# Patient Record
Sex: Female | Born: 1970 | Race: White | Hispanic: No | Marital: Married | Smoking: Never smoker
Health system: Southern US, Community
[De-identification: ages and names within clinical notes are randomized; demographics above are authoritative.]

## PROBLEM LIST (undated history)

## (undated) DIAGNOSIS — E039 Hypothyroidism, unspecified: Secondary | ICD-10-CM

## (undated) DIAGNOSIS — F32A Depression, unspecified: Secondary | ICD-10-CM

## (undated) DIAGNOSIS — Z803 Family history of malignant neoplasm of breast: Secondary | ICD-10-CM

## (undated) DIAGNOSIS — C801 Malignant (primary) neoplasm, unspecified: Secondary | ICD-10-CM

## (undated) DIAGNOSIS — G43909 Migraine, unspecified, not intractable, without status migrainosus: Secondary | ICD-10-CM

## (undated) DIAGNOSIS — Z923 Personal history of irradiation: Secondary | ICD-10-CM

## (undated) DIAGNOSIS — L501 Idiopathic urticaria: Secondary | ICD-10-CM

## (undated) DIAGNOSIS — Z8249 Family history of ischemic heart disease and other diseases of the circulatory system: Secondary | ICD-10-CM

## (undated) DIAGNOSIS — Z8042 Family history of malignant neoplasm of prostate: Secondary | ICD-10-CM

## (undated) DIAGNOSIS — F419 Anxiety disorder, unspecified: Secondary | ICD-10-CM

## (undated) DIAGNOSIS — E663 Overweight: Secondary | ICD-10-CM

## (undated) DIAGNOSIS — C50412 Malignant neoplasm of upper-outer quadrant of left female breast: Secondary | ICD-10-CM

## (undated) DIAGNOSIS — E079 Disorder of thyroid, unspecified: Secondary | ICD-10-CM

## (undated) HISTORY — PX: OTHER SURGICAL HISTORY: SHX169

## (undated) HISTORY — DX: Malignant neoplasm of upper-outer quadrant of left female breast: C50.412

## (undated) HISTORY — DX: Idiopathic urticaria: L50.1

## (undated) HISTORY — DX: Family history of malignant neoplasm of breast: Z80.3

## (undated) HISTORY — PX: WISDOM TOOTH EXTRACTION: SHX21

## (undated) HISTORY — DX: Overweight: E66.3

## (undated) HISTORY — DX: Family history of malignant neoplasm of prostate: Z80.42

## (undated) HISTORY — DX: Personal history of irradiation: Z92.3

## (undated) HISTORY — DX: Disorder of thyroid, unspecified: E07.9

## (undated) HISTORY — PX: MULTIPLE TOOTH EXTRACTIONS: SHX2053

## (undated) HISTORY — DX: Migraine, unspecified, not intractable, without status migrainosus: G43.909

## (undated) HISTORY — PX: DILATION AND CURETTAGE OF UTERUS: SHX78

---

## 1998-05-20 ENCOUNTER — Encounter: Admission: RE | Admit: 1998-05-20 | Discharge: 1998-05-20 | Payer: Self-pay | Admitting: *Deleted

## 2001-07-17 ENCOUNTER — Encounter: Admission: RE | Admit: 2001-07-17 | Discharge: 2001-07-17 | Payer: Self-pay | Admitting: Family Medicine

## 2001-07-17 ENCOUNTER — Encounter: Payer: Self-pay | Admitting: Family Medicine

## 2002-08-24 ENCOUNTER — Emergency Department (HOSPITAL_COMMUNITY): Admission: EM | Admit: 2002-08-24 | Discharge: 2002-08-24 | Payer: Self-pay | Admitting: Emergency Medicine

## 2002-08-24 ENCOUNTER — Encounter: Payer: Self-pay | Admitting: Emergency Medicine

## 2002-09-01 ENCOUNTER — Ambulatory Visit (HOSPITAL_COMMUNITY): Admission: RE | Admit: 2002-09-01 | Discharge: 2002-09-01 | Payer: Self-pay | Admitting: Obstetrics and Gynecology

## 2002-09-01 ENCOUNTER — Encounter: Payer: Self-pay | Admitting: Obstetrics and Gynecology

## 2002-11-26 ENCOUNTER — Other Ambulatory Visit: Admission: RE | Admit: 2002-11-26 | Discharge: 2002-11-26 | Payer: Self-pay | Admitting: Obstetrics and Gynecology

## 2003-05-02 ENCOUNTER — Inpatient Hospital Stay (HOSPITAL_COMMUNITY): Admission: AD | Admit: 2003-05-02 | Discharge: 2003-05-05 | Payer: Self-pay | Admitting: *Deleted

## 2004-06-23 ENCOUNTER — Other Ambulatory Visit: Admission: RE | Admit: 2004-06-23 | Discharge: 2004-06-23 | Payer: Self-pay | Admitting: Obstetrics and Gynecology

## 2005-01-03 ENCOUNTER — Ambulatory Visit: Admission: RE | Admit: 2005-01-03 | Discharge: 2005-01-03 | Payer: Self-pay | Admitting: Obstetrics and Gynecology

## 2005-01-05 ENCOUNTER — Ambulatory Visit (HOSPITAL_COMMUNITY): Admission: RE | Admit: 2005-01-05 | Discharge: 2005-01-05 | Payer: Self-pay | Admitting: Obstetrics and Gynecology

## 2005-01-06 ENCOUNTER — Encounter (INDEPENDENT_AMBULATORY_CARE_PROVIDER_SITE_OTHER): Payer: Self-pay | Admitting: *Deleted

## 2005-01-06 ENCOUNTER — Ambulatory Visit (HOSPITAL_COMMUNITY): Admission: RE | Admit: 2005-01-06 | Discharge: 2005-01-06 | Payer: Self-pay | Admitting: Obstetrics and Gynecology

## 2005-08-23 ENCOUNTER — Other Ambulatory Visit: Admission: RE | Admit: 2005-08-23 | Discharge: 2005-08-23 | Payer: Self-pay | Admitting: Obstetrics and Gynecology

## 2005-08-23 ENCOUNTER — Other Ambulatory Visit: Admission: RE | Admit: 2005-08-23 | Discharge: 2005-08-23 | Payer: Self-pay | Admitting: *Deleted

## 2005-10-11 ENCOUNTER — Ambulatory Visit (HOSPITAL_COMMUNITY): Admission: RE | Admit: 2005-10-11 | Discharge: 2005-10-11 | Payer: Self-pay | Admitting: Obstetrics and Gynecology

## 2006-03-18 ENCOUNTER — Inpatient Hospital Stay (HOSPITAL_COMMUNITY): Admission: RE | Admit: 2006-03-18 | Discharge: 2006-03-20 | Payer: Self-pay | Admitting: Obstetrics and Gynecology

## 2007-12-16 ENCOUNTER — Encounter: Admission: RE | Admit: 2007-12-16 | Discharge: 2007-12-16 | Payer: Self-pay | Admitting: Family Medicine

## 2008-03-30 ENCOUNTER — Encounter: Admission: RE | Admit: 2008-03-30 | Discharge: 2008-03-30 | Payer: Self-pay | Admitting: Allergy and Immunology

## 2009-11-03 ENCOUNTER — Other Ambulatory Visit: Admission: RE | Admit: 2009-11-03 | Discharge: 2009-11-03 | Payer: Self-pay | Admitting: Family Medicine

## 2011-02-24 NOTE — H&P (Signed)
NAME:  Marie Castro, Marie Castro                            ACCOUNT NO.:  [id number]   MEDICAL RECORD NO.:  [id number]                   PATIENT TYPE:  INP   LOCATION:  9160                                 [facility]   PHYSICIAN:  Osborn Coho, M.D.                DATE OF BIRTH:  Dec 23, 1970   DATE OF ADMISSION:  05/02/2003  DATE OF DISCHARGE:                                HISTORY & PHYSICAL   HISTORY OF PRESENT ILLNESS:  Marie Castro is a 40 year old, G1, P0, at 42 and  5/7ths weeks.  Estimated date of delivery 04/28/03 by dates, confirmed with  18-week ultrasound.  She was admitted overnight for therapeutic rest.  She  had been contracting for the past two days and was quite uncomfortable.  Therapeutic rest was successful, and the patient was able to rest through  the night.  This morning her contractions have become stronger and closer  together.  She is now 4 cm dilated and is admitted for early labor.  She  reports positive fetal movement.  No bleeding, no rupture of membranes.  She  has no PIH symptoms.  Her pregnancy has been followed by the CNM service at  [facility] and is remarkable for (1) hypothyroidism, (2) vegetarian, (3) group B  strep negative.  This patient's pregnancy was initially evaluated at the  office of [facility] on 09/30/02 at approximately [redacted] weeks gestation.  EDC  determined by dates and confirmed with ultrasound.  Her pregnancy has been  essentially unremarkable.  She has been size equal to dates throughout.  Normotensive with no proteinuria.   PRENATAL LABORATORY WORK:  On 10/01/03, hemoglobin and hematocrit were 13.1  and 38.9, platelets 329,000.  Blood type and Rh O+, antibody screen  negative, VDRL nonreactive, rubella immune.  Hepatitis B surface antigen  negative.  HIV nonreactive.  Pap within normal limits.  GC and Chlamydia  negative.  PSA in the first trimester 3.646; in the second trimester 2.978,  and was also within normal limits in the third trimester.  Quad screen  was  declined at 28 weeks.  One hour glucose challenge within normal limits.  Hemoglobin 12.3.  At 36 weeks, culture of the vaginal tract is negative for  group B strep, and also negative for GC and Chlamydia.   MEDICATIONS:  1. Levoxyl daily.  2. Prenatal vitamins.   ALLERGIES:  No known drug allergies.   ACTIVITY:  Denies the use of tobacco, alcohol, or illicit drugs.   OB HISTORY:  Present pregnancy.   MEDICAL HISTORY:  Hypothyroidism.   SURGICAL HISTORY:  Tonsils and adenoids.   FAMILY HISTORY:  The patient's father and paternal grandfather with MI.  The  patient's mother with cardiovascular disease.  Maternal grandmother with  congestive heart failure.  Maternal grandmother and maternal aunt with  breast cancer.  Maternal grandfather and paternal grandfather with prostate  cancer.  A maternal  first cousin has a history of epilepsy.   GENETIC HISTORY:  There is no family history of familial or chromosomal  disorders, children who died at infancy, or who were both with birth  defects.   SOCIAL HISTORY:  Marie Castro is a married 40 year old Caucasian female.  Her  husband, Chip Segall, is involved and supportive.  They are [place] in their  faith.   REVIEW OF SYSTEMS:  As described above.  The patient is typical of one with  the uterine pregnancy at term in early labor.   PHYSICAL EXAMINATION:  VITAL SIGNS:  Stable, afebrile.  HEENT:  Unremarkable.  HEART:  Regular rate and rhythm.  LUNGS:  Clear.  ABDOMEN:  Gravid in its contour.  Uterine fundus is noted to extend 39 cm  above the level of pubic symphysis.  Leopold maneuver __________.  Cephalic  presentation.  The estimated fetal weight is 8 pounds.  The baseline of the  fetal heart rate monitor is 140's.  Positive variability, positive  accelerations.  Reactive with no pruritic changes.  The patient is  contracting every 3-5 minutes.  Digital exam of the cervix finds it to be 4  cm dilated, 90% effaced, with the  cephalic presenting part at a -1 station  and membranes intact.  EXTREMITIES:  No pathologic edema.  DTRs are 1+ with no clonus.   ASSESSMENT:  Intrauterine pregnancy at term, early labor.   PLAN:  Admit per Dr. Molly Maduro.  Routine CNM orders.  Follow expectantly at the  present time.     Rica Koyanagi, C.N.M.               Osborn Coho, M.D.    SDM/MEDQ  D:  05/03/2003  T:  05/03/2003  Job:  [id number]

## 2011-02-24 NOTE — Consult Note (Signed)
NAMEJALEYAH, Castro NO.:  [id number]   MEDICAL RECORD NO.:  [id number]          PATIENT TYPE:  OUT   LOCATION:  DFTL                          [facility]   PHYSICIAN:  Osborn Coho, M.D.   DATE OF BIRTH:  1971/08/09   DATE OF CONSULTATION:  01/03/2005  DATE OF DISCHARGE:  01/03/2005                                   CONSULTATION   Date of Consultation 01/03/05   A consultation was done on [name] in the ultrasound suite after being  called with notification of an ultrasound report.  The patient had several  questions and also called her husband who had further questions.  Approximately one hour was spent in discussion with [name] and her  husband regarding options, which were observation, medical management,  surgical management or a combination of these.  I discussed the pregnancy  was likely abnormal and that a D&C could be performed at that point in time  in order to better assess the pregnancy and then to offer methotrexate if no  villi were noted in the uterine cavity.  The patient and her husband decided  to proceed with observation for at least one more b-hcg on March 30.  During  this discussion, I also went to review the ultrasound films with the  radiologist.  The patient planned to follow up as scheduled.  The date of  the consultation was January 03, 2005.      AR/MEDQ  D:  02/06/2005  T:  02/06/2005  Job:  [id number]

## 2011-02-24 NOTE — H&P (Signed)
NAMEZAEDA, MCFERRAN NO.:  [id number]   MEDICAL RECORD NO.:  [id number]          PATIENT TYPE:  AMB   LOCATION:  SDC                           [facility]   PHYSICIAN:  Janine Limbo, M.D.DATE OF BIRTH:  [birth year]   DATE OF ADMISSION:  DATE OF DISCHARGE:                                HISTORY & PHYSICAL   HISTORY OF PRESENT ILLNESS:  [name] is a 40 year old married white female  with a history of hypothyroidism, gravida 2, para 1-0-0-1 who presents for a  D&E because of an incomplete AB.  The patient was seen at [facility] on December 22, 2004, with first trimester spotting of a few hours  duration (the patient's last menstrual period had been November 10, 2004,  making her approximately six weeks pregnant by dates).  The patient's  quantitative HCG on December 22, 2004, was 1083.0 and her ABORH was O positive.  Patient only reported feeling achy in her abdominal area at the time of her  first visit but denied any frank pain. nausea, vomiting, fever or vaginitis  symptoms.  The next day, patient was seen at [facility] because  of increased vaginal bleeding requiring the change of a perineal pad every  two to three hours accompanied by cramping in her lower abdomen and back  area which she rated as a 7/10 on a 10-point scale.  Patient reported that  this all seemed very much like  her regular menstrual period.  A stat  quantitative HCG on that day revealed a value of 700.4.  Patient was then  counseled regarding a probable incomplete AB and given the option of  expectant management or a D&E.  Patient chose the former.  Patient was again  seen on December 30, 2004, with continued bleeding requiring the change of five  perineal pads per day but denied any further cramping.  A quantitative HCG  done that day was surprisingly elevated at 1169.0.  A follow-up quantitative  HCG was also done on January 03, 2005, which again was elevated at  1216.5.  A  transvaginal ultrasound done on January 03, 2005, showed minimal hyperechoic  material with Doppler flow measuring 8 mm x 11 mm raising the question of  gestational tropoblastic disease.  There was no identifiable intrauterine  gestational sac, no evidence of adnexal ectopic pregnancy and no free fluid.  Ovaries both appeared normal on this study with the observation of a left  corpus luteum cyst measuring 2.2 cm.  Given this report, a final  quantitative HCG was performed on January 05, 2005, yielding the value of  822.4. This drop in quantitative HCG level further supported a diagnosis of  incomplete AB.  Patient was again counseled regarding the findings and  management options and she has consented for a D&E.   CURRENT MEDICATIONS:  Levoxyl and prenatal vitamins.   ALLERGIES:  No known drug allergies.   PAST MEDICAL HISTORY:  1.  OB history:  Gravida 2, para 1-0-0-1.  2.  Medical history is positive for hypothyroidism.   PAST  SURGICAL HISTORY:  1.  Patient had ear tubes placed as an infant.  2.  She has had a tonsillectomy.   FAMILY HISTORY:  Positive for thyroid disease, heart disease, breast cancer  (maternal grandmother and maternal aunt), prostate cancer and bladder  cancer, Crohn's disease and osteoporosis.   HABITS:  Patient does not use alcohol or tobacco.   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   REVIEW OF SYMPTOMS:  Negative except as mentioned in the history of present  illness.   PHYSICAL EXAMINATION:  VITAL SIGNS:  (December 30, 2004)  Blood pressure  120/68, weight 145, height 5 feet 6 inches tall.  LUNGS:  Clear to auscultation.  There are no wheezing, rhonchi or rales.  CARDIOVASCULAR:  Regular rate and rhythm.  There is no murmur.  BACK:  No CVA tenderness.  ABDOMEN:  Bowel sounds are present.  It is soft without tenderness,  guarding, rebound or organomegaly.   PELVIC:  EGBUS is within normal limits.  Vagina is rugous.  There is a small  amount of brown discharge in the vaginal vault.  Cervix is nontender without  lesions.  It is long and closed.  Uterus is normal size, shape and  consistency without tenderness.  Adnexa without tenderness or masses.  Rectovaginal without tenderness or masses.   IMPRESSION:  1.  Incomplete spontaneous abortion.  2.  Hypothyroidism.   DISPOSITION:  A discussion was held with the patient regarding the  indications for her procedure along with the risk involved which include,  but are not limited to, reaction to anesthesia, damage to adjacent organs,  excessive bleeding, infection, and the possibility of the formation of scar  tissue within the endometrium.  Patient has accepted these risks and has  consented to proceed with a dilatation and evacuation at [facility] of  [place] on January 06, 2005.      EJP/MEDQ  D:  01/05/2005  T:  01/05/2005  Job:  [id number]

## 2011-02-24 NOTE — H&P (Signed)
NAME:  Marie Castro, Marie Castro                            ACCOUNT NO.:  [id number]   MEDICAL RECORD NO.:  [id number]                   PATIENT TYPE:  INP   LOCATION:  9160                                 [facility]   PHYSICIAN:  Osborn Coho, M.D.                DATE OF BIRTH:  16-Jul-1971   DATE OF ADMISSION:  05/02/2003  DATE OF DISCHARGE:                                HISTORY & PHYSICAL   HISTORY OF PRESENT ILLNESS:  [name] is a 40 year old married white female,  gravida 1, para 0, at 40-4/7 weeks by ultrasound, who presents complaining  of uterine contractions q.3 min. that have increased in intensity and  frequency throughout the day and throughout most of the previous night.  She  denies any leaking or bleeding but did pass her mucous plug and had some  bloody show last evening.  She reports positive fetal movement.  She denies  nausea, vomiting, headaches, or visual disturbances.  She is very  uncomfortable with her uterine contractions and initially was rating them a  9/10 and was requesting pain medication upon her arrival.   Her cervix was initially 1.5 cm, 90%, vertex, and -1, but she was too  uncomfortable to ambulate, and as previously mentioned, requested some  medication for pain.  She did receive 2 mg of Stadol IM and 12.5 mg of  Phenergan IM and rested in between her uterine contractions but was still  very uncomfortable with the uterine contractions.  Her contractions did  space out to about q.5-7 min., but she is still too uncomfortable to venture  home.   Her pregnancy has been followed at [facility] by the certified  nurse midwife and has been at risk for a  history of hypothyroidism on  Synthroid.  Her group B Streptococcus, GC, and Chlamydia were all negative  at 36 weeks.   OB/GYN HISTORY:  She is a primigravida with LMP July 22, 2002, giving her  an [facility] of April 28, 2003.  Her OB/GYN is noncontributory.   PAST MEDICAL HISTORY:  She reports  having had the usual childhood diseases.  She has no other medical problems other than hypothyroidism, for which she  takes Synthroid.   PAST SURGICAL HISTORY:  Her only surgery was tonsils and adenoids as a  child.   ALLERGIES:  She has no known drug allergies.   FAMILY HISTORY:  Father and paternal grandfather with MI, mother with  coronary artery disease, maternal grandmother with congestive heart failure.  She has multiple family members and grandparents with breast cancer and  prostate cancer.   GENETIC HISTORY:  Negative.   SOCIAL HISTORY:  She is married to [employer], who is involved and  supportive.  They are both employed full time.  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  They deny any illicit  drug  use, alcohol, or smoking with this pregnancy.  They are of the  [place] faith.   LABORATORY DATA:  Prenatal laboratories:  Blood type O positive.  Antibody  screen negative.  Syphilis nonreactive.  Rubella positive.  Hepatitis B  surface antigen negative.  HIV nonreactive.  GC, Chlamydia, and group B  Streptococcus all negative at 36 weeks' gestation.  TSHs have been stable.  One hour Glucola 68.   PHYSICAL EXAMINATION:  VITAL SIGNS:  Stable.  She is afebrile.  HEENT:  Grossly within normal limits.  HEART:  Regular rate and rhythm.  CHEST:  Clear.  BREASTS:  Soft and nontender.  ABDOMEN:  Gravid with uterine contractions currently q.3-7 min.  Her fetal  heart rate is reactive and reassuring.  Occasional early decelerations have  been noted.  PELVIC:  Cervix 1.5-2 cm, 90%, vertex, and -1.  EXTREMITIES:  Within normal limits.   ASSESSMENT:  1. Intrauterine pregnancy at 40-4/7 weeks.  2. Prolonged prodromal labor.  3. Maternal fatigue.  4. Negative group B Streptococcus.  5. Hypothyroidism.   PLAN:  Per consultation with Dr. Su Hilt, is to try some increased  therapeutic rest and to reevaluate her labor pattern after that.   The  patient and her husband agree with the plan.     Concha Pyo. Duplantis, C.N.M.              Osborn Coho, M.D.    SJD/MEDQ  D:  05/02/2003  T:  05/02/2003  Job:  [id number]

## 2011-02-24 NOTE — Op Note (Signed)
NAMECHARLA, CRISCIONE                  ACCOUNT NO.:  [id number]   MEDICAL RECORD NO.:  [id number]          PATIENT TYPE:  AMB   LOCATION:  SDC                           [facility]   PHYSICIAN:  Janine Limbo, M.D.DATE OF BIRTH:  [birth year]   DATE OF PROCEDURE:  01/06/2005  DATE OF DISCHARGE:                                 OPERATIVE REPORT   PREOPERATIVE DIAGNOSIS:  First trimester missed abortion.   POSTOPERATIVE DIAGNOSIS:  First trimester missed abortion.   PROCEDURE:  Suction dilatation and evacuation.   SURGEON:  Janine Limbo, M.D.   ANESTHETIC:  Monitored anesthetic control, paracervical block using 0.5%  Marcaine with epinephrine.   DISPOSITION:  Ms. Zia is a 40 year old female, gravida 2, para 1-0-0-1, who  presents in the first trimester.  The patient has had abnormal quantitative  HCG values.  Her chorionic gonadotropin has been checked on multiple  occasions.  An ultrasound was performed and no identifiable intrauterine  gestation could be seen.  The patient understands the indications for her  surgical procedure and she accepts the risk of, but not limited to,  anesthetic complications, bleeding, infection, and possible damage to  surrounding organs.   FINDINGS:  The patient is blood type is O+.  Her preoperative hemoglobin was  12.8.  A small amount of tissue was removed from within the uterus.   PROCEDURE:  The patient was taken to the operating room, where she was given  medication through her IV line.  The patient's perineum and vagina were  prepped with multiple layers of Betadine.  The bladder was drained of urine.  Examination under anesthesia was performed.  The patient was then sterilely  draped.  A paracervical block was placed using 10 mL of 0.5% Marcaine with  epinephrine.  A single-tooth tenaculum was placed on the cervix.  The uterus  was sounded to 8 cm.  The cervix was gradually dilated.  A #10 suction  curette was used to evacuate the  uterus.  The uterus was then explored using  Randall stone forceps and then cleaned with a medium sharp curette.  The  cavity was felt to be clean at the end of our procedure.  The patient  tolerated the procedure well.  Hemostasis was adequate.  The patient was  returned to a supine position.  She was awakened from her anesthetic.  She  was taken to the recovery room in stable condition.  Sponge, needle, and  instrument counts were correct on two occasions. The estimated blood loss  was 10 mL.  The patient tolerated her procedure well.  She was taken to the  recovery room in stable condition.   FOLLOW-UP INSTRUCTIONS:  The patient will take ibuprofen 600 mg every six  hours as needed as needed for mild to moderate pain.  She will take Vicodin one or two tablets every four hours as needed for  severe pain.  She will call for questions or concerns.  She was given a copy  of the postoperative instruction sheet as prepared by the [facility]  of [place] for  patients who have undergone a dilatation and curettage.      AVS/MEDQ  D:  01/06/2005  T:  01/06/2005  Job:  [id number]

## 2011-02-24 NOTE — H&P (Signed)
NAMEARONDA, Castro                  ACCOUNT NO.:  [id number]   MEDICAL RECORD NO.:  [id number]          PATIENT TYPE:  INP   LOCATION:  9163                          [facility]   PHYSICIAN:  Hal Morales, M.D.DATE OF BIRTH:  1971-06-12   DATE OF ADMISSION:  03/18/2006  DATE OF DISCHARGE:                                HISTORY & PHYSICAL   HISTORY OF PRESENT ILLNESS:  [name] is a 40 year old gravida 3, para 1-0-1-  1 who presents at 15 and 3/7 weeks, EDD March 15, 2006 as determined by dates  and confirmed with early first trimester ultrasound.  The patient presents  in active labor with contractions increasing in frequency and intensity.  She reports positive fetal movement.  No vaginal bleeding.  No rupture of  membranes.  Denies any headache, visual changes or epigastric pain.   Her pregnancy has been followed by the C.N.M. service at [facility] and is  remarkable for:  1.  Advanced maternal age.  2.  Hypothyroid.  3.  Postpartum depression.  4.  Group B strep negative.   This patient entered prenatal care at [facility] on July 25, 2006 at  approximately [redacted] weeks gestation.  EDC determined by dates confirmed with  ultrasound.  The patient has had an uneventful pregnancy, except for back  pain, which did become progressively worse throughout her pregnancy,  requiring the patient to leave work for maternity leave at approximately 36  weeks' gestation.  She was seen at [facility] for physical therapy and has felt  better since being home and not having the stress of work.  The patient has  been size equal to dates throughout.  She has been normotensive with no  proteinuria.  Her initial pregnancy weight was 145; last recorded pregnancy  weight 182, for a weight gain of 37 pounds.  BMI by a pre-pregnant weight is  23.   OBSTETRICAL HISTORY:  In 2004, the patient had a normal spontaneous vaginal  delivery at term with birth of a 7 pound 1 ounce female infant named Marie Castro  with no  complications.  Her second pregnancy in 2006 was a first trimester  SAB with a D&C; no complications.  This is her third and current pregnancy.   PRENATAL LABORATORY WORK:  On August 23, 2005, the patient's hemoglobin  and hematocrit were 13.0 and 37.1, platelets 286,000, VDRL nonreactive,  rubella immune, hepatitis B surface antigen negative, blood type O positive,  antibody screen.  Quad screen declined at 28 week.  One hour glucose  challenge 109.  Hemoglobin 11.4 at that time.  At 36 weeks, culture of the  vaginal tract is negative for group B strep.   MEDICAL HISTORY:  1.  Hypothyroid.  2.  Postpartum depression.   SURGICAL HISTORY:  1.  D&C in 2006.  2.  T&A as a child.   FAMILY HISTORY:  The patient's mother, father, maternal grandmother and  paternal grandfather with heart disease.  Maternal grandmother with breast  cancer.  Maternal aunt with breast cancer.  Maternal grandfather and  paternal grandfather with prostate CA.  GENETIC HISTORY:  The patient is 40 years old.  Otherwise, there is no  family history of familial or chromosomal disorders, children that were born  with any birth defects or any that died in infancy.   ALLERGIES:  The patient has no known drug allergies. She is allergic to  latex.   SOCIAL HISTORY:  She denies the use of tobacco, alcohol or illicit drugs.  [name] is a married Caucasian female.  She works as an Systems developer.  Her husband, Marie Castro, is a Scientific laboratory technician.  He is involved and  supportive, and they are [place] in their faith.   REVIEW OF SYSTEMS:  As described above.  The patient is typical of one with  a uterine pregnancy at term in active labor.   PHYSICAL EXAMINATION:  VITAL SIGNS:  Stable.  The patient is afebrile.  HEENT:  Unremarkable.  HEART:  Regular rate and rhythm.  LUNGS:  Clear.  ABDOMEN:  Soft and nontender.  It is gravid in its contour.  Uterine fundus  is noted to extend 39 cm above the level of the pubic  symphysis.  Leopold's  maneuvers finds the infant in a longitudinal lie, cephalic presentation, and  the estimated fetal weight is 7 to 7-1/2 pounds.  The baseline of the fetal  heart rate monitor is 140s with average long-term variability.  Reactivity  is present with no periodic changes.  The patient is contracting every 2-3  minutes.  PELVIC:  Digital exam of the cervix finds it to be 4 cm dilated, 90%  effaced, with the cephalic presenting part at a zero station and membranes  intact.  EXTREMITIES:  No cough pathologic edema.  DTRs are 1+ with no clonus.   ASSESSMENT:  Intrauterine pregnancy at term, active labor.   PLAN:  Admit per Dr. Dierdre Forth.  Routine C.N.M. orders.  The patient  may have an epidural as desired for comfort.  Anticipate spontaneous vaginal  delivery.      Rica Koyanagi, C.N.M.      Hal Morales, M.D.  Electronically Signed    SDM/MEDQ  D:  03/18/2006  T:  03/18/2006  Job:  [id number]

## 2012-07-17 ENCOUNTER — Ambulatory Visit: Payer: Self-pay | Admitting: Obstetrics and Gynecology

## 2012-07-17 ENCOUNTER — Encounter: Payer: Self-pay | Admitting: Obstetrics and Gynecology

## 2012-07-17 VITALS — BP 94/70 | HR 72 | Ht 66.0 in | Wt 146.0 lb

## 2012-07-17 DIAGNOSIS — IMO0001 Reserved for inherently not codable concepts without codable children: Secondary | ICD-10-CM

## 2012-07-17 DIAGNOSIS — Z124 Encounter for screening for malignant neoplasm of cervix: Secondary | ICD-10-CM

## 2012-07-17 DIAGNOSIS — Z01419 Encounter for gynecological examination (general) (routine) without abnormal findings: Secondary | ICD-10-CM

## 2012-07-17 DIAGNOSIS — L738 Other specified follicular disorders: Secondary | ICD-10-CM

## 2012-07-17 MED ORDER — FLUCONAZOLE 150 MG PO TABS: 150.0000 mg | ORAL_TABLET | Freq: Once | ORAL | Status: DC

## 2012-07-17 MED ORDER — CEPHALEXIN 500 MG PO CAPS: 500.0000 mg | ORAL_CAPSULE | Freq: Four times a day (QID) | ORAL | Status: DC

## 2012-07-17 NOTE — Progress Notes (Signed)
Subjective:    Marie Castro is a 41 y.o. female, G3P0002, who presents for an annual exam. The patient reports irregular menses.lately.   Has an Implanon and is due to change in February.  Bleeding may be spotting to requiring a tampon change every 2 hours   Menstrual cycle:   LMP: Patient's last menstrual period was 06/23/2012.             Review of Systems Pertinent items are noted in HPI. Denies pelvic pain, urinary tract symptoms, vaginitis symptoms, irregular bleeding, menopausal symptoms, change in bowel habits or rectal bleeding   Objective:    BP 94/70  Pulse 72  Ht 5\' 6"  (1.676 m)  Wt 146 lb (66.225 kg)  BMI 23.56 kg/m2  LMP 06/23/2012    Wt Readings from Last 1 Encounters:  07/17/12 146 lb (66.225 kg)   Body mass index is 23.56 kg/(m^2). General Appearance: Alert, no acute distress HEENT: Grossly normal Neck / Thyroid: Supple, no thyromegaly or cervical adenopathy Lungs: Clear to auscultation bilaterally Back: No CVA tenderness Breast Exam: No masses or nodes.No dimpling, nipple retraction or discharge. But mildly tender Cardiovascular: Regular rate and rhythm.  Gastrointestinal: Soft, non-tender, no masses or organomegaly Pelvic Exam: EGBUS-left crural fold with tender, non-pointing, firm abscess right side with evidence of chronically inflamed lesions, no adenopathy; vagina-normal rugae, cervix- without lesions or tenderness, uterus appears normal size shape and consistency, adnexae-no masses or tenderness Rectovaginal: no masses and normal sphincter tone Lymphatic Exam: Non-palpable nodes in neck, clavicular,  axillary, or inguinal regions  Skin: no rashes or abnormalities Extremities: no clubbing cyanosis or edema  Neurologic: grossly normal Psychiatric: Alert and oriented  UPT: negative   Assessment:   Routine GYN Exam Irregular Bleeding Due To Implanon Crural Sebaceous Abscess-recurrent Implanon (replacement due early 2014)     Plan:  Declines  estradiol for now to help control irregular bleeding  PAP sent  Recommend Behavioral Therapist for phobias (claustrophobia)  Cephalexin 500 mg  #24 qid x 7 days no refills  Warm compresses to medial thigh abscess  Patient to follow up with dermatologist for recurrent perineal abscesses  RTO 1 year or prn  Clinton Wahlberg,ELMIRAPA-C

## 2012-07-17 NOTE — Patient Instructions (Addendum)
Integrative Therapies   Multivitamin with at least 400 mcg of folic acid (or more)  Vitamin E 400 IU (mixed tocopherols)  1 po daily

## 2012-07-17 NOTE — Progress Notes (Signed)
Regular Periods: no Mammogram: yes  AGE 41  Monthly Breast Ex.: yes Exercise: yes  Tetanus < 10 years: no Seatbelts: yes  NI. Bladder Functn.: yes Abuse at home: no  Daily BM's: yes Stressful Work: yes  Healthy Diet: yes Sigmoid-Colonoscopy: NO  Calcium: no Medical problems this year: CYCLE PROBLEMS,ANXIETY    LAST PAP:2010  Contraception: IMPLANON  Mammogram:  AGE 41 NEED MAMMO STRONG FHX OF BREAST CANCER  PCP: [facility]  PMH: KNEE SURGERY 5/12  FMH: NO CHANGE  Last Bone Scan: NO   PT IS MARRIED

## 2012-09-23 ENCOUNTER — Telehealth: Payer: Self-pay | Admitting: Obstetrics and Gynecology

## 2012-09-23 NOTE — Telephone Encounter (Signed)
TC to pt. States SINCE 09/21/12 lower abd and pelvic cramping like severe menstrual cramps. No relief with heat.  No UTI sx. T-?  Area feels tender to touch. Declines MAU eval or appt in AM.  Sched for eval with SL 09/24/12. To call after hours with increase in sx. Pt verbalizes comprehension.

## 2012-09-24 ENCOUNTER — Encounter: Payer: BC Managed Care – PPO | Admitting: Obstetrics and Gynecology

## 2014-08-10 ENCOUNTER — Encounter: Payer: Self-pay | Admitting: Obstetrics and Gynecology

## 2017-07-12 ENCOUNTER — Other Ambulatory Visit: Payer: Self-pay | Admitting: Radiology

## 2017-07-12 HISTORY — PX: BREAST BIOPSY: SHX20

## 2017-07-16 ENCOUNTER — Other Ambulatory Visit: Payer: Self-pay | Admitting: General Surgery

## 2017-07-16 DIAGNOSIS — C50211 Malignant neoplasm of upper-inner quadrant of right female breast: Secondary | ICD-10-CM

## 2017-07-17 ENCOUNTER — Telehealth: Payer: Self-pay | Admitting: Genetic Counselor

## 2017-07-17 ENCOUNTER — Encounter: Payer: Self-pay | Admitting: Genetics

## 2017-07-17 NOTE — Telephone Encounter (Signed)
Genetic counseling appt has been scheduled for the pt to see Roma Kayser on 10/10 at 1pm. Pt aware to arrive 15 minutes early.

## 2017-07-18 ENCOUNTER — Other Ambulatory Visit: Payer: BC Managed Care – PPO

## 2017-07-18 ENCOUNTER — Encounter: Payer: Self-pay | Admitting: Radiation Oncology

## 2017-07-18 ENCOUNTER — Encounter: Payer: Self-pay | Admitting: Genetic Counselor

## 2017-07-18 ENCOUNTER — Ambulatory Visit (HOSPITAL_BASED_OUTPATIENT_CLINIC_OR_DEPARTMENT_OTHER): Payer: BC Managed Care – PPO | Admitting: Genetic Counselor

## 2017-07-18 DIAGNOSIS — Z315 Encounter for genetic counseling: Secondary | ICD-10-CM

## 2017-07-18 DIAGNOSIS — Z808 Family history of malignant neoplasm of other organs or systems: Secondary | ICD-10-CM

## 2017-07-18 DIAGNOSIS — Z8042 Family history of malignant neoplasm of prostate: Secondary | ICD-10-CM | POA: Diagnosis not present

## 2017-07-18 DIAGNOSIS — Z170 Estrogen receptor positive status [ER+]: Secondary | ICD-10-CM | POA: Diagnosis not present

## 2017-07-18 DIAGNOSIS — C50911 Malignant neoplasm of unspecified site of right female breast: Secondary | ICD-10-CM | POA: Diagnosis not present

## 2017-07-18 DIAGNOSIS — Z803 Family history of malignant neoplasm of breast: Secondary | ICD-10-CM

## 2017-07-18 NOTE — Progress Notes (Signed)
REFERRING PROVIDER: [name], MD [address] [place], [place] [address]  PRIMARY PROVIDER:  [name], PA-C  PRIMARY REASON FOR VISIT:  1. Family history of breast cancer   2. Family history of prostate cancer   3. Malignant neoplasm of right breast in female, estrogen receptor positive, unspecified site of breast ([place])      HISTORY OF PRESENT ILLNESS:   Marie Castro, a 46 y.o. female, was seen for a [facility] cancer genetics consultation at the request of Marie Castro due to a personal and family history of cancer.  Marie Castro presents to clinic today to discuss the possibility of a hereditary predisposition to cancer, genetic testing, and to further clarify her future cancer risks, as well as potential cancer risks for family members.   In October 2018, at the age of 89, Marie Castro was diagnosed with lobular carcinoma of the right breast. This will be treated with lumpectomy and radiation, unless her testing comes back positive.  Her mother underwent genetic testing in 2015 through [facility] panel.  This testing was negative.        CANCER HISTORY:   No history exists.     HORMONAL RISK FACTORS:  Menarche was at age 14.  First live birth at age 62.  OCP use for approximately 2 years.  Ovaries intact: yes.  Hysterectomy: no.  Menopausal status: premenopausal.  HRT use: 0 years. Colonoscopy: no; not examined. Mammogram within the last year: yes. Number of breast biopsies: 1. Up to date with pelvic exams:  yes. Any excessive radiation exposure in the past:  no  Past Medical History:  Diagnosis Date  . Family history of breast cancer   . Family history of prostate cancer   . Thyroid disease     Past Surgical History:  Procedure Laterality Date  . KNEE SURGERY    . MULTIPLE TOOTH EXTRACTIONS    . WISDOM TOOTH EXTRACTION      Social History   Social History  . Marital status: Married    Spouse name: N/A  . Number of children: N/A  . Years  of education: N/A   Social History Main Topics  . Smoking status: Never Smoker  . Smokeless tobacco: Never Used  . Alcohol use No  . Drug use: No  . Sexual activity: Yes    Birth control/ protection: Implant     Comment: IMPLANON  12-02-09   Other Topics Concern  . Not on file   Social History Narrative  . No narrative on file     FAMILY HISTORY:  We obtained a detailed, 4-generation family history.  Significant diagnoses are listed below: Family History  Problem Relation Age of Onset  . Cancer Mother        SKIN AND BREAST  . Heart disease Mother   . Heart disease Father   . Hyperlipidemia Father   . Depression Father   . Cancer Maternal Grandmother        BREAST  . Cancer Maternal Grandfather        PROSTATE  . Irritable bowel syndrome Paternal Grandmother   . Heart disease Paternal Grandfather   . Breast cancer Maternal Aunt 72  . Breast cancer Maternal Aunt        dx in her 22s    The patient has two children who are cancer free.  She has one sister who is also cancer free.  Both parents are living.    The patient's father has  heart disease and is 82.  He has a brother who is cancer free. The paternal grandparents are deceased from heart disease and complications from IBS.   The patient's mother was diagnosed with breast cancer at 48.  She has also had several skin cancers. She had one brother and two sisters.  Both sisters developed breast cancer, one in her 22's and the other at 74.  The maternal grandparents are deceased.  The grandfather died of prostate cancer and the grandmother had breast cancer before she died.  Patient's maternal ancestors are of [place] descent, and paternal ancestors are of [place] descent. There is no reported Ashkenazi Jewish ancestry. There is no known consanguinity.  GENETIC COUNSELING ASSESSMENT: Marie Castro is a 46 y.o. female with a personal and family history of cancer which is somewhat suggestive of a hereditary cancer  syndrome and predisposition to cancer. We, therefore, discussed and recommended the following at today's visit.   DISCUSSION: We discussed that about 5-10% of breast cancer is hereditary with most cases due to BRCA mutations.  Other genes associated with hereditary breast cancer syndromes include ATM, CHEK2 and PALB2.  The patient's mother had genetic testing and was negative in 2015.  There are several additional genes that are on panels now that were not tested back in 2015.  We discussed reflexing the testing so that we can do a STAT panel and get those results back in 7-10 days.  While our biggest concern is for the cancer on her mother's side, her father has a limited family history with having a brother, who has two sons and one daughter.  Therefore there could be a hereditary gene being passed on in the family that is being masked by the men. We reviewed the characteristics, features and inheritance patterns of hereditary cancer syndromes. We also discussed genetic testing, including the appropriate family members to test, the process of testing, insurance coverage and turn-around-time for results. We discussed the implications of a negative, positive and/or variant of uncertain significant result. In order to get genetic test results in a timely manner so that Marie Castro can use these genetic test results for surgical decisions, we recommended Marie Castro pursue genetic testing for the 9-gene STAT panel. If this test is negative, we then recommend Marie Castro pursue reflex genetic testing to either the hereditary cancer gene panel or multi-gene panel.  We will discuss when the STAT panel comes back.  Based on Marie Castro's personal and family history of cancer, she meets medical criteria for genetic testing. Despite that she meets criteria, she may still have an out of pocket cost. We discussed that if her out of pocket cost for testing is over $100, the laboratory will call and confirm whether she wants to proceed  with testing.  If the out of pocket cost of testing is less than $100 she will be billed by the genetic testing laboratory.   PLAN: After considering the risks, benefits, and limitations, Marie Castro  provided informed consent to pursue genetic testing and the blood sample was sent to [facility] for analysis of the STAT panel. Results should be available within approximately 7-10 days' time, at which point they will be disclosed by telephone to Marie Castro, as will any additional recommendations warranted by these results. Marie Castro will receive a summary of her genetic counseling visit and a copy of her results once available. This information will also be available in Epic. We encouraged Marie Castro to remain in contact with  cancer genetics annually so that we can continuously update the family history and inform her of any changes in cancer genetics and testing that may be of benefit for her family. Marie Castro questions were answered to her satisfaction today. Our contact information was provided should additional questions or concerns arise.  Lastly, we encouraged Marie Castro to remain in contact with cancer genetics annually so that we can continuously update the family history and inform her of any changes in cancer genetics and testing that may be of benefit for this family.   Ms.  Castro questions were answered to her satisfaction today. Our contact information was provided should additional questions or concerns arise. Thank you for the referral and allowing [place] to share in the care of your patient.   Jyren Cerasoli P. Florene Glen, [place], [facility] Certified Genetic Counselor [email or web address] phone: [phone]  The patient was seen for a total of 60 minutes in face-to-face genetic counseling.  This patient was discussed with Drs. Magrinat, Lindi Adie and/or Burr Medico who agrees with the above.    _______________________________________________________________________ For Office Staff:  Number of people involved  in session: 1 Was an Intern/ student involved with case: no

## 2017-07-19 ENCOUNTER — Other Ambulatory Visit: Payer: Self-pay | Admitting: Oncology

## 2017-07-19 ENCOUNTER — Telehealth: Payer: Self-pay | Admitting: Oncology

## 2017-07-19 ENCOUNTER — Ambulatory Visit
Admission: RE | Admit: 2017-07-19 | Discharge: 2017-07-19 | Disposition: A | Discharge status: Home / Self Care | Payer: BC Managed Care – PPO | Source: Ambulatory Visit | Attending: General Surgery | Admitting: General Surgery

## 2017-07-19 ENCOUNTER — Encounter: Payer: Self-pay | Admitting: Oncology

## 2017-07-19 ENCOUNTER — Telehealth: Payer: Self-pay | Admitting: General Surgery

## 2017-07-19 DIAGNOSIS — C50211 Malignant neoplasm of upper-inner quadrant of right female breast: Secondary | ICD-10-CM

## 2017-07-19 MED ORDER — GADOBENATE DIMEGLUMINE 529 MG/ML IV SOLN
13.0000 mL | Freq: Once | INTRAVENOUS | Status: AC | PRN
  Administered 2017-07-19: 13 mL via INTRAVENOUS

## 2017-07-19 NOTE — Telephone Encounter (Signed)
Left detailed message on machine regarding needing additional left sided biopsies.  I had discussed this as a possibility at her appointment.  Will get orders for these.

## 2017-07-19 NOTE — Telephone Encounter (Signed)
Appt has been scheduled for the pt to see Dr. Jana Hakim on 10/16 at 430pm. Pt has agreed to the appt date and time. Letter mailed.

## 2017-07-20 ENCOUNTER — Other Ambulatory Visit: Payer: Self-pay | Admitting: General Surgery

## 2017-07-20 DIAGNOSIS — C50211 Malignant neoplasm of upper-inner quadrant of right female breast: Secondary | ICD-10-CM

## 2017-07-20 DIAGNOSIS — N63 Unspecified lump in unspecified breast: Secondary | ICD-10-CM

## 2017-07-20 NOTE — Progress Notes (Signed)
Location of Breast Cancer: right breast cancer  Histology per Pathology Report:   07/12/17 Diagnosis 1. Breast, right, needle core biopsy - INVASIVE AND IN SITU MAMMARY CARCINOMA. - SEE COMMENT. 2. Breast, left, needle core biopsy - LOBULAR NEOPLASIA (ATYPICAL LOBULAR HYPERPLASIA). - FIBROCYSTIC CHANGES WITH ADENOSIS AND CALCIFICATIONS.  Receptor Status: ER(80%), PR (95%), Her2-neu (negative), Ki-(1%)  Did patient present with symptoms (if so, please note symptoms) or was this found on screening mammography?: screening mammogram  Past/Anticipated interventions by surgeon, if any: BIL mastectomies? Biopsies of left breast done today by Dr. Barry Dienes.   Past/Anticipated interventions by medical oncology, if any: Apt with Dr. Jana Hakim 07/24/17  Lymphedema issues, if any:  no   Pain issues, if any:  yes has discomfort in her left breast from biopsy this morning.  SAFETY ISSUES:  Prior radiation? no  Pacemaker/ICD? no  Possible current pregnancy?no  Is the patient on methotrexate? no  Current Complaints / other details:  Patient is here with her husband.  BP 100/68 (BP Location: Left Arm, Patient Position: Sitting)   Pulse 78   Temp 98.1 F (36.7 C) (Oral)   Ht _0  (1.676 m)   Wt 150 lb 6.4 oz (68.2 kg)   LMP 07/25/2017   SpO2 100%   BMI 24.28 kg/m    Wt Readings from Last 3 Encounters:  07/25/17 150 lb 6.4 oz (68.2 kg)  07/24/17 153 lb 12.8 oz (69.8 kg)  07/17/12 146 lb (66.2 kg)      Hess, Craige Cotta, RN 07/20/2017,10:11 AM

## 2017-07-23 ENCOUNTER — Telehealth: Payer: Self-pay | Admitting: General Surgery

## 2017-07-23 NOTE — Telephone Encounter (Signed)
Discussed MRI with patient.  No new findings on right side, which is cancer side.  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  She is getting these biopsied this week.    Genetics will hopefully be back this week as well as pathology from these two biopsies.  She has appts with med onc and rad onc this week.    Will plan plastics referral if genetics positive.  I will see back 10/29 to review final surgical plan.   I anticipate that orders will be in before that, but we can discuss in detail on that date.

## 2017-07-24 ENCOUNTER — Ambulatory Visit (HOSPITAL_BASED_OUTPATIENT_CLINIC_OR_DEPARTMENT_OTHER): Payer: BC Managed Care – PPO | Admitting: Oncology

## 2017-07-24 DIAGNOSIS — N6092 Unspecified benign mammary dysplasia of left breast: Secondary | ICD-10-CM | POA: Diagnosis not present

## 2017-07-24 DIAGNOSIS — Z170 Estrogen receptor positive status [ER+]: Secondary | ICD-10-CM | POA: Diagnosis not present

## 2017-07-24 DIAGNOSIS — C50211 Malignant neoplasm of upper-inner quadrant of right female breast: Secondary | ICD-10-CM

## 2017-07-24 DIAGNOSIS — Z803 Family history of malignant neoplasm of breast: Secondary | ICD-10-CM

## 2017-07-24 DIAGNOSIS — Z8042 Family history of malignant neoplasm of prostate: Secondary | ICD-10-CM

## 2017-07-24 MED ORDER — TAMOXIFEN CITRATE 20 MG PO TABS
20.0000 mg | ORAL_TABLET | Freq: Every day | ORAL | 12 refills | Status: AC
Start: 2017-07-24 — End: 2017-08-23

## 2017-07-24 NOTE — Progress Notes (Signed)
Marie  [phone] [phone]     ID: Marie Castro DOB: 09-22-1971  MR#: [id number]  OUZ#:[id number]  Patient Care Team: [name], PA-C as PCP - General (Nurse Practitioner) [name], [name], MD as Consulting Physician (Oncology) [name], MD as Consulting Physician (General Surgery) [name], MD as Consulting Physician (Radiation Oncology) [name], PA-C as Physician Assistant (Obstetrics and Gynecology) Register, [name], PA-C as Physician Assistant (Dermatology) [name], MD OTHER MD:  CHIEF COMPLAINT: Invasive lobular breast cancer, estrogen receptor positive  CURRENT TREATMENT: Awaiting definitive surgery, on tamoxifen   HISTORY OF CURRENT ILLNESS: Marie Castro had bilateral screening mammography at [facility] 07/06/2017, showing a new 1.5 cm oval mass in the right breast upper inner quadrant, a possible development asymmetry in the left breast upper outer quadrant, and a 0.3 cm area of calcifications in the left breast upper outer quadrant. On 07/11/2017 the patient underwent left diagnostic mammography with tomography and bilateral breast ultrasonography. The left breast mammography showed the breast density to be category C. In the upper-outer quadrant of the left breast there was a 0.8 cm oval mass which by ultrasound was a benign complicated cyst. There was also a 0.3 cm area of grouped calcifications in the left breast upper outer quadrant.  In the right breast, ultrasonography showed an irregular mass in the upper inner quadrant adjacent to a benign 1.5 cm simple cyst.  On 07/12/2017 she underwent bilateral biopsies. On the right there was an invasive lobular carcinoma, grade 1 or 2, estrogen receptor 80% positive with moderate staining intensity, progesterone receptor 95% positive with strong staining intensity, with an MIB-1 of 1%, and no HER-2 amplification, the signals ratio being 1.12 and the number per cell 1.85.  ([id number])  On the left the biopsy showed atypical lobular hyperplasia.  Both axillae were sonographically benign  The patient's subsequent history is as detailed below.  INTERVAL HISTORY: Marie Castro was evaluated in the breast clinic 07/24/2017 accompanied by her sister Marie Castro. Her case was also presented at the multidisciplinary breast cancer conference on 07/18/2017 At that time a preliminary plan was proposed: Breast MRI, bilateral lumpectomies versus mastectomies depending on MRI results and genetics testing,, Oncotype on the invasive disease, anti-estrogens and radiation   REVIEW OF SYSTEMS: There were no specific symptoms leading to the original mammogram, which was routinely scheduled. The patient denies unusual headaches, visual changes, nausea, vomiting, stiff neck, dizziness, or gait imbalance. There has been no cough, phlegm production, or pleurisy, no chest pain or pressure, and no change in bowel or bladder habits. The patient denies fever, rash, bleeding, unexplained fatigue or unexplained weight loss. A detailed review of systems was otherwise entirely negative.   PAST MEDICAL HISTORY: Past Medical History:  Diagnosis Date  . Family history of breast cancer   . Family history of prostate cancer   . Thyroid disease     PAST SURGICAL HISTORY: Past Surgical History:  Procedure Laterality Date  . KNEE SURGERY    . MULTIPLE TOOTH EXTRACTIONS    . WISDOM TOOTH EXTRACTION      FAMILY HISTORY Family History  Problem Relation Age of Onset  . Cancer Mother        SKIN AND BREAST  . Heart disease Mother   . Heart disease Father   . Hyperlipidemia Father   . Depression Father   . Cancer Maternal Grandmother        BREAST  . Cancer Maternal Grandfather  PROSTATE  . Irritable bowel syndrome Paternal Grandmother   . Heart disease Paternal Grandfather   . Breast cancer Maternal Aunt 72  . Breast cancer Maternal Aunt        dx in her 4s  The patient's father is  46 year old and her mother 18 year old as of October 2018. The patient has one sister, no brothers. The patient's mother and mother's mother both had breast cancer in their 14s. A maternal aunt had breast cancer at age 68, and another at age 84. The patient's mother has had genetics testing, which was negative. Please also consulted the detailed genetics pedigree in [place]:  No LMP recorded. Menarche age 71, first live birth age 40, she is still premenopausal, with regular periods lasting approximately 5 days, of which 3 are heavy. She used oral contraceptives approximately 2 years with no complications and also had a subcutaneous depot contraceptive briefly. 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 Her husband Hospital doctor ("Chip") is a Control and instrumentation engineer for [employer]. Their son, Marie Castro, is 64 and daughter Marie Castro 11 as of October 2018. The patient attends the [employer]: Social History  Substance Use Topics  . Smoking status: Never Smoker  . Smokeless tobacco: Never Used  . Alcohol use No     Colonoscopy: Never  PAP:  Bone density: Never   Allergies  Allergen Reactions  . Latex Rash    Pt states rubber bands from her braces causes ulcers in her mouth    Current Outpatient Prescriptions  Medication Sig Dispense Refill  . Prenatal Vit-Fe Fumarate-FA (PRENATAL MULTIVITAMIN) TABS tablet Take 1 tablet by mouth daily at 12 noon.    . DiphenhydrAMINE HCl (BENADRYL ALLERGY PO) Take by mouth.    Marland Kitchen ibuprofen (ADVIL,MOTRIN) 100 MG tablet Take 100 mg by mouth every 6 (six) hours as needed.    Marland Kitchen levothyroxine (SYNTHROID, LEVOTHROID) 88 MCG tablet Take 88 mcg by mouth daily.    . tamoxifen (NOLVADEX) 20 MG tablet Take 1 tablet (20 mg total) by mouth daily. 90 tablet 12    No current facility-administered medications for this visit.     OBJECTIVE: Young white woman in no acute distress  Vitals:   07/24/17 1612  BP: (!) 124/53  Pulse: 68  Resp: 18  Temp: 98.3 F (36.8 C)  SpO2: 100%     Body mass index is 24.82 kg/m.   Wt Readings from Last 3 Encounters:  07/24/17 153 lb 12.8 oz (69.8 kg)  07/17/12 146 lb (66.2 kg)      ECOG FS:0 - Asymptomatic  Ocular: Sclerae unicteric, pupils round and equal Ear-nose-throat: Oropharynx clear and moist Lymphatic: No cervical or supraclavicular adenopathy Lungs no rales or rhonchi Heart regular rate and rhythm Abd soft, nontender, positive bowel sounds MSK no focal spinal tenderness, no joint edema Neuro: non-focal, well-oriented, appropriate affect Breasts: The right breast is status post recent biopsy. I do not palpate a mass. The left breast is also status post recent biopsy. There is a significant ecchymosis and a palpable mass which is likely hematoma in the upper outer quadrant. Both axillae are benign.   LAB RESULTS:  CMP  No results found for: NA, K, CL, CO2, GLUCOSE, BUN, CREATININE, CALCIUM, PROT, ALBUMIN, AST, ALT, ALKPHOS, BILITOT, GFRNONAA, GFRAA  No results found  for: TOTALPROTELP, ALBUMINELP, A1GS, A2GS, BETS, BETA2SER, GAMS, MSPIKE, SPEI  No results found for: KPAFRELGTCHN, LAMBDASER, KAPLAMBRATIO  No results found for: WBC, NEUTROABS, HGB, HCT, MCV, PLT    Chemistry   No results found for: NA, K, CL, CO2, BUN, CREATININE, GLU No results found for: CALCIUM, ALKPHOS, AST, ALT, BILITOT     No results found for: LABCA2  No components found for: OPRAFO255  No results for input(s): INR in the last 168 hours.  No results found for: LABCA2  No results found for: KZG948  No results found for: XAF583  No results found for: EXO600  No results found for: CA2729  No components found for: HGQUANT  No results found for: CEA1 / No results found for: CEA1   No results found for:  AFPTUMOR  No results found for: CHROMOGRNA  No results found for: PSA1  No visits with results within 3 Day(s) from this visit.  Latest known visit with results is:  Office Visit on 07/17/2012  Component Date Value Ref Range Status  . Specimen adequacy: 07/17/2012    Final   SATISFACTORY.  Endocervical/transformation zone component present.  Marland Kitchen FINAL DIAGNOSIS: 07/17/2012    Final   Comment: -                          NEGATIVE FOR INTRAEPITHELIAL LESIONS OR MALIGNANCY.  Marland Kitchen Cytotechnologist: 07/17/2012    Final   Comment: Candiss Norse; CT(ASCP)                                                     *                          The Pap smear is a screening test used to detect cervical cancer and                          its precursors.  It should not be used as the sole means to detect                          cervical cancer.  Test results should be correlated with clinical                          findings.  The Pap test is unreliable for detecting endometrial                          lesions and should not be used to evaluate endometrial abnormalities.                           Data indicate the Pap test is subject to false negative and false                          positive results.  Therefore, periodic repeat testing and follow-up of                          any unexplained clinical Castro and symptoms are recommended.  Test was performed at [facility], [address] [place], [address], [place] [address].                          Dr. Yisroel Ramming, Cytology Medical Director                                                     A negative PAP Letter will be sent to the patient.    (this displays the last labs from the last 3 days)  No results found for: TOTALPROTELP, ALBUMINELP, A1GS, A2GS, BETS, BETA2SER, GAMS, MSPIKE, SPEI (this displays SPEP labs)  No results found for: KPAFRELGTCHN,  LAMBDASER, KAPLAMBRATIO (kappa/lambda light chains)  No results found for: HGBA, HGBA2QUANT, HGBFQUANT, HGBSQUAN (Hemoglobinopathy evaluation)   No results found for: LDH  No results found for: IRON, TIBC, IRONPCTSAT (Iron and TIBC)  No results found for: FERRITIN  Urinalysis No results found for: COLORURINE, APPEARANCEUR, LABSPEC, PHURINE, GLUCOSEU, HGBUR, BILIRUBINUR, KETONESUR, PROTEINUR, UROBILINOGEN, NITRITE, LEUKOCYTESUR   STUDIES: Mr Breast Bilateral W Wo Contrast  Result Date: 07/19/2017 CLINICAL DATA:  46 year old female with recently diagnosed invasive mammary carcinoma post ultrasound-guided biopsy of a mass in the upper right breast. In addition, recent diagnosis of atypical lobular hyperplasia post stereotactic guided biopsy of calcifications in the upper-outer left breast. LABS:  Not applicable EXAM: BILATERAL BREAST MRI WITH AND WITHOUT CONTRAST TECHNIQUE: Multiplanar, multisequence MR images of both breasts were obtained prior to and following the intravenous administration of 13 ml of MultiHance. THREE-DIMENSIONAL MR IMAGE RENDERING ON INDEPENDENT WORKSTATION: Three-dimensional MR images were rendered by post-processing of the original MR data on an independent workstation. The three-dimensional MR images were interpreted, and findings are reported in the following complete MRI report for this study. Three dimensional images were evaluated at the independent DynaCad workstation COMPARISON:  Previous exam(s). FINDINGS: Breast composition: c.  Heterogeneous fibroglandular tissue. Background parenchymal enhancement: Moderate to marked. Right breast: Irregular enhancing mass in the slightly upper outer right breast compatible with biopsy proven malignancy measures 1.7 cm AP, 1 cm transverse and 0.7 cm craniocaudal. Biopsy marking clip is present along the lateral margin of this mass. A 1.4 cm cyst is adjacent to this mass medially at the approximate 12 o'clock location. Left breast:  Biopsy related changes are present in the upper-outer left breast at site of biopsy proven atypical lobular hyperplasia with no associated abnormal enhancement. There is an irregular enhancing mass superior to the biopsy site in the upper outer left breast measuring 0.6 x 0.5 x 0.5 cm (series 5, image 112). There is an additional irregular enhancing mass in the upper outer left breast slightly anterior inferior to the prior biopsy site measuring 0.7 cm AP, 0.3 cm transverse and 0.3 cm craniocaudal (series 5, image 106) Lymph nodes: No abnormal appearing lymph nodes. Ancillary findings:  None. IMPRESSION: 1. Biopsy proven malignancy in the right breast measures 1.7 x 1 x 0.7 cm. No additional sites of disease identified. 2. Two small suspicious enhancing masses in the upper-outer left breast. RECOMMENDATION: Recommend MRI guided biopsy of the 2 enhancing masses in the upper-outer left breast. BI-RADS CATEGORY  4: Suspicious. Electronically  Signed   By: Everlean Alstrom M.D.   On: 07/19/2017 16:17    ELIGIBLE FOR AVAILABLE RESEARCH PROTOCOL: no  ASSESSMENT: 46 y.o. [place] woman status post right breast upper inner quadrant biopsy 07/11/2017 for a clinical T1c N0, stage IA invasive lobular carcinoma, E-cadherin negative, grade 1 or 2, estrogen and progesterone receptor positive, HER-2 nonamplified, with an MIB-1 of 1%  (1) biopsy of left breast upper outer quadrant calcifications 07/11/2017 showed atypical lobular hyperplasia  (a) breast MRI 07/19/2017 shows 2 additional areas of concern in the left breast, with MRI guided biopsy scheduled 07/25/2017  (2) genetics testing 07/18/2017--results pending  (3) tamoxifen started neoadjuvantly 07/24/2017  (4) definitive surgery pending  (5) Oncotype DX or Mammaprint to be obtained from the definitive surgical sample  (6) adjuvant radiation as appropriate  (7) continue anti-estrogens a minimum of 5 years.  PLAN: We spent the better part of today's  hour-long appointment discussing the biology of her diagnosis and the specifics of her situation.  We first reviewed the fact that cancer is not one disease but more than 100 different diseases and that it is important to keep them separate-- otherwise when friends and relatives discuss their own cancer experiences with [facility] confusion can result. We also discussed the differences between ductal and lobular breast cancers and Sharika understands that lobular breast cancers can be difficult to detect with mammography or ultrasonography, although they are well seen on MRI.  We discussed the difference between local and systemic therapy. In terms of loco-regional treatment, lumpectomy plus radiation is equivalent to mastectomy as far as survival is concerned. We also noted that in terms of sequencing of treatments, whether systemic therapy or surgery is done first does not affect the ultimate outcome. This is relevant to Amaiya's case, since given possible delays in her definitive surgery because of reconstruction considerations tamoxifen is being started neoadjuvantly.  We then discussed the rationale for systemic therapy. There is some risk that this cancer may have already spread to other parts of her body. Patients frequently ask at this point about bone scans, CAT scans and PET scans to find out if they have occult breast cancer somewhere else. The problem is that in early stage disease we are much more likely to find false positives then true cancers and this would expose the patient to unnecessary procedures as well as unnecessary radiation. Scans cannot answer the question the patient really would like to know, which is whether she has microscopic disease elsewhere in her body. For those reasons we do not recommend them.  Of course we would proceed to aggressive evaluation of any symptoms that might suggest metastatic disease, but that is not the case here.  Next we went over the options for systemic  therapy which are anti-estrogens, anti-HER-2 immunotherapy, and chemotherapy. Adylynn does not meet criteria for anti-HER-2 immunotherapy. She is a good candidate for anti-estrogens.  The question of chemotherapy is more complicated. Chemotherapy is most effective in rapidly growing, aggressive tumors. It is much less effective in low-grade, slow growing cancers, like Caily 's. For that reason we are going to request an Oncotype from the definitive surgical sample, as suggested by NCCN guidelines. That will help [place] make a definitive decision regarding chemotherapy in this case.  Tamelia does qualify for genetics testing, and the results should be out later this week. In patients who carry a deleterious mutation [for example in a  BRCA gene], the risk of a new breast cancer developing in the future may  be sufficiently great that the patient may choose bilateral mastectomies. However if she wishes to keep her breasts in that situation it is safe to do so. That would require intensified screening, which generally means not only yearly mammography but a yearly breast MRI as well. Of course, if there is a deleterious mutation bilateral oophorectomy would be necessary as there is no standard screening protocol for ovarian cancer.  At this point the question of whether [facility] well choose bilateral mastectomies if she proves to carry a deleterious mutation, or whether she will need bilateral mastectomies for anatomic reasons depending on the results of her MRI guided biopsies, are pending. She will discuss those options with Dr. Barry Dienes 08/06/2017. If bilateral mastectomy is going to be the plan, Cinthia will want to meet with plastic surgery and likely seek a second opinion regarding reconstruction options. All that will take time  For that reason we are starting tamoxifen now. We did discuss the possible toxicities, side effects and complications of this agent. Thayer Headings since it is not a contraceptive. Starting it now will  allow her to make her surgical decision without any feeling of hurry. Also it will let her explore how she tolerates this medication which currently is the only anti-estrogen choice available to her--once she becomes postmenopausal of course we can consider switching to an aromatase inhibitors.  Darilyn has a good understanding of the overall plan. She agrees with it. She knows the goal of treatment in her case is cure. She will call with any problems that may develop before her next visit here.  [name], MD   07/24/2017 5:34 PM Medical Oncology and Hematology [facility] [address] [place] [address] [place], [place] [address] Tel. [phone]    Fax. [phone]

## 2017-07-25 ENCOUNTER — Telehealth: Payer: Self-pay | Admitting: Oncology

## 2017-07-25 ENCOUNTER — Ambulatory Visit
Admission: RE | Admit: 2017-07-25 | Discharge: 2017-07-25 | Disposition: A | Discharge status: Home / Self Care | Payer: BC Managed Care – PPO | Source: Ambulatory Visit | Attending: Radiation Oncology | Admitting: Radiation Oncology

## 2017-07-25 ENCOUNTER — Ambulatory Visit
Admission: RE | Admit: 2017-07-25 | Discharge: 2017-07-25 | Disposition: A | Discharge status: Home / Self Care | Payer: BC Managed Care – PPO | Source: Ambulatory Visit | Attending: General Surgery | Admitting: General Surgery

## 2017-07-25 ENCOUNTER — Encounter: Payer: Self-pay | Admitting: Radiation Oncology

## 2017-07-25 VITALS — BP 100/68 | HR 78 | Temp 98.1°F | Ht 66.0 in | Wt 150.4 lb

## 2017-07-25 DIAGNOSIS — Z510 Encounter for antineoplastic radiation therapy: Secondary | ICD-10-CM | POA: Diagnosis not present

## 2017-07-25 DIAGNOSIS — C50211 Malignant neoplasm of upper-inner quadrant of right female breast: Secondary | ICD-10-CM

## 2017-07-25 DIAGNOSIS — Z808 Family history of malignant neoplasm of other organs or systems: Secondary | ICD-10-CM | POA: Insufficient documentation

## 2017-07-25 DIAGNOSIS — Z8042 Family history of malignant neoplasm of prostate: Secondary | ICD-10-CM | POA: Diagnosis not present

## 2017-07-25 DIAGNOSIS — Z79899 Other long term (current) drug therapy: Secondary | ICD-10-CM | POA: Diagnosis not present

## 2017-07-25 DIAGNOSIS — Z79810 Long term (current) use of selective estrogen receptor modulators (SERMs): Secondary | ICD-10-CM | POA: Diagnosis not present

## 2017-07-25 DIAGNOSIS — Z818 Family history of other mental and behavioral disorders: Secondary | ICD-10-CM | POA: Insufficient documentation

## 2017-07-25 DIAGNOSIS — Z170 Estrogen receptor positive status [ER+]: Principal | ICD-10-CM

## 2017-07-25 DIAGNOSIS — N63 Unspecified lump in unspecified breast: Secondary | ICD-10-CM

## 2017-07-25 DIAGNOSIS — Z79890 Hormone replacement therapy (postmenopausal): Secondary | ICD-10-CM | POA: Insufficient documentation

## 2017-07-25 DIAGNOSIS — Z91040 Latex allergy status: Secondary | ICD-10-CM | POA: Diagnosis not present

## 2017-07-25 DIAGNOSIS — Z803 Family history of malignant neoplasm of breast: Secondary | ICD-10-CM | POA: Diagnosis not present

## 2017-07-25 DIAGNOSIS — D0512 Intraductal carcinoma in situ of left breast: Secondary | ICD-10-CM | POA: Insufficient documentation

## 2017-07-25 DIAGNOSIS — Z885 Allergy status to narcotic agent status: Secondary | ICD-10-CM | POA: Insufficient documentation

## 2017-07-25 DIAGNOSIS — E079 Disorder of thyroid, unspecified: Secondary | ICD-10-CM | POA: Insufficient documentation

## 2017-07-25 DIAGNOSIS — Z8249 Family history of ischemic heart disease and other diseases of the circulatory system: Secondary | ICD-10-CM | POA: Insufficient documentation

## 2017-07-25 HISTORY — DX: Family history of ischemic heart disease and other diseases of the circulatory system: Z82.49

## 2017-07-25 MED ORDER — GADOBENATE DIMEGLUMINE 529 MG/ML IV SOLN
13.0000 mL | Freq: Once | INTRAVENOUS | Status: AC | PRN
  Administered 2017-07-25: 13 mL via INTRAVENOUS

## 2017-07-25 NOTE — Progress Notes (Signed)
Radiation Oncology         [phone] ________________________________  Initial Outpatient Consultation  Name: Marie Castro MRN: [id number]  Date: 07/25/2017  DOB: 1971/03/13  WE:[id number], Barth Kirks, PA-C  [name], MD   REFERRING PHYSICIAN: [name], MD  DIAGNOSIS: The encounter diagnosis was Malignant neoplasm of upper-inner quadrant of right breast in female, estrogen receptor positive ([place]).  46 year-old woman with stage IA (clinical T1c N0) invasive lobular carcinoma of the right breast, grade 1, ER/PR positive, HER-2 negative.   HISTORY OF PRESENT ILLNESS::Marie Castro is a 46 y.o. female who is kindly referred to our clinic by Dr. Barry Dienes. The patient originally presented for screening mammogram on 07/06/17 which showed a new 1.5 cm oval mass in the right breast upper inner quadrant, a possible development asymmetry in the left breast upper outer quadrant, and a 0.3 cm area of calcifications in the left breast upper outer quadrant. On 07/11/17 the patient underwent left breast diagnostic mammography and bilateral breast ultrasound. These scans showed in the upper outer quadrant of the left breast is a 0.8 cm oval mass which by ultrasound was a benign complicated cyst. There was also a 0.3 cm area of grouped calcifications in the left breast upper outer quadrant. In the right breast, ultrasound showed an irregular mass in the upper inner quadrant adjacent to a benign 1.5 cm simple cyst.  Biopsies were performed on 07/12/17. Biopsy of the right breast showed invasive and in situ mammary carcinoma which appears grade I-II, ER 80%, PR 95%, HER-2 negative, Ki-67 1%. The malignant cells are negative for E-cadherin, supporting a lobular phenotype. Biopsy of the left breast showed lobular neoplasia (atypical lobular hyperplasia) and fibrocystic changes with adenosis and calcifications.  Bilateral Breast MRI on 07/19/17 showed biopsy proven malignancy in the right breast measuring 1.7 x 1 x  0.7 cm. No additional sites of disease identified. Also seen were two small suspicious enhancing masses in the upper-outer left breast.  The patient met with medical oncologist Dr. Jana Hakim yesterday, 07/24/17. She has also met with surgeon, Dr. Barry Dienes.  The patient is here for further evaluation and discussion of radiation treatment options in the management of her disease.   PREVIOUS RADIATION THERAPY: No  PAST MEDICAL HISTORY:  has a past medical history of Family history of breast cancer; Family history of heart disease; Family history of prostate cancer; and Thyroid disease.    PAST SURGICAL HISTORY: Past Surgical History:  Procedure Laterality Date  . BREAST BIOPSY Right 07/12/2017  . KNEE SURGERY    . MULTIPLE TOOTH EXTRACTIONS    . WISDOM TOOTH EXTRACTION      FAMILY HISTORY: family history includes Breast cancer in her maternal aunt; Breast cancer (age of onset: 63) in her maternal aunt; Cancer in her maternal grandfather, maternal grandmother, and mother; Depression in her father; Heart disease in her father, mother, and paternal grandfather; Hyperlipidemia in her father; Irritable bowel syndrome in her paternal grandmother; Melanoma in her maternal grandmother.  SOCIAL HISTORY:  reports that she has never smoked. She has never used smokeless tobacco. She reports that she does not drink alcohol or use drugs.  ALLERGIES: Latex  MEDICATIONS:  Current Outpatient Prescriptions  Medication Sig Dispense Refill  . DiphenhydrAMINE HCl (BENADRYL ALLERGY PO) Take by mouth.    Marland Kitchen ibuprofen (ADVIL,MOTRIN) 100 MG tablet Take 100 mg by mouth every 6 (six) hours as needed.    Marland Kitchen levothyroxine (SYNTHROID, LEVOTHROID) 88 MCG tablet Take 88 mcg by mouth daily.    Marland Kitchen  LORazepam (ATIVAN) 0.5 MG tablet TAKE 1 TABLET UP TO TWICE DAILY AS NEEDED  0  . Prenatal Vit-Fe Fumarate-FA (PRENATAL MULTIVITAMIN) TABS tablet Take 1 tablet by mouth daily at 12 noon.    . tamoxifen (NOLVADEX) 20 MG tablet Take 1  tablet (20 mg total) by mouth daily. (Patient not taking: Reported on 07/25/2017) 90 tablet 12   No current facility-administered medications for this encounter.     REVIEW OF SYSTEMS:  On review of systems, the patient reports that she is doing well overall. She is accompanied by her husband today. She denies lymphedema issues. She had additional biopsies this morning. She does note discomfort in her left breast from biopsy this morning. She denies any chest pain, shortness of breath, cough, fevers, chills, night sweats, unintended weight changes. She does report headaches but believes they are stress-related. She denies any bowel or bladder disturbances, and denies abdominal pain, nausea or vomiting. She reports limited mobility in her right shoulder, there are "just certain movements" which she avoids. States she had previous physical therapy work and dry needling done for this which improved prior aching in the shoulder. 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 A complete review of systems is obtained and is otherwise negative.  REVIEW OF SYSTEMS: A 10+ POINT REVIEW OF SYSTEMS WAS OBTAINED including neurology, dermatology, psychiatry, cardiac, respiratory, lymph, extremities, GI, GU, musculoskeletal, constitutional, reproductive, HEENT. All pertinent positives are noted in the HPI. All others are negative.   PHYSICAL EXAM:  height is '5\' 6"'  (1.676 m) and weight is 150 lb 6.4 oz (68.2 kg). Her oral temperature is 98.1 F (36.7 C). Her blood pressure is 100/68 and her pulse is 78. Her oxygen saturation is 100%.   General: Alert and oriented, in no acute distress, accompanied by husband HEENT: Head is normocephalic. Extraocular movements are intact. Oropharynx is clear. Neck: Neck is supple, no palpable cervical or supraclavicular lymphadenopathy. Heart: Regular in rate and rhythm with no murmurs, rubs, or gallops. Chest: Clear to auscultation bilaterally, with no rhonchi, wheezes,  or rales. Abdomen: Soft, nontender, nondistended, with no rigidity or guarding. Extremities: No cyanosis or edema. Lymphatics: see Neck Exam Skin: No concerning lesions. Musculoskeletal: symmetric strength and muscle tone throughout. Neurologic: Cranial nerves II through XII are grossly intact. No obvious focalities. Speech is fluent. Coordination is intact. Psychiatric: Judgment and insight are intact. Affect is appropriate. Breast: Right breast, the patient has some mild bruising in the upper outer quadrant near the areola. No palpable mass, nipple discharge or bleeding. Left breast, the patient has significant bruising in the upper outer quadrant from her MRI-guided biopsies earlier today. The patient has steri-strips in place and some bandaging. No obvious mass appreciated.    ECOG = 1  0 - Asymptomatic (Fully active, able to carry on all predisease activities without restriction)  1 - Symptomatic but completely ambulatory (Restricted in physically strenuous activity but ambulatory and able to carry out work of a light or sedentary nature. For example, light housework, office work)  2 - Symptomatic, <50% in bed during the day (Ambulatory and capable of all self care but unable to carry out any work activities. Up and about more than 50% of waking hours)  3 - Symptomatic, >50% in bed, but not bedbound (Capable of only limited self-care, confined to bed or chair 50% or more of waking hours)  4 - Bedbound (Completely disabled. Cannot carry on any self-care. Totally confined to bed or chair)  5 -  Death   Eustace Pen MM, Creech RH, Tormey DC, et al. [phone]). "Toxicity and response criteria of the [facility]". [place] Oncol. 5 (6): 649-55  LABORATORY DATA:  No results found for: WBC, HGB, HCT, MCV, PLT, NEUTROABS No results found for: NA, K, CL, CO2, GLUCOSE, CREATININE, CALCIUM    RADIOGRAPHY: Mr Breast Bilateral W Wo Contrast  Result Date: 07/19/2017 CLINICAL  DATA:  46 year old female with recently diagnosed invasive mammary carcinoma post ultrasound-guided biopsy of a mass in the upper right breast. In addition, recent diagnosis of atypical lobular hyperplasia post stereotactic guided biopsy of calcifications in the upper-outer left breast. LABS:  Not applicable EXAM: BILATERAL BREAST MRI WITH AND WITHOUT CONTRAST TECHNIQUE: Multiplanar, multisequence MR images of both breasts were obtained prior to and following the intravenous administration of 13 ml of MultiHance. THREE-DIMENSIONAL MR IMAGE RENDERING ON INDEPENDENT WORKSTATION: Three-dimensional MR images were rendered by post-processing of the original MR data on an independent workstation. The three-dimensional MR images were interpreted, and findings are reported in the following complete MRI report for this study. Three dimensional images were evaluated at the independent DynaCad workstation COMPARISON:  Previous exam(s). FINDINGS: Breast composition: c.  Heterogeneous fibroglandular tissue. Background parenchymal enhancement: Moderate to marked. Right breast: Irregular enhancing mass in the slightly upper outer right breast compatible with biopsy proven malignancy measures 1.7 cm AP, 1 cm transverse and 0.7 cm craniocaudal. Biopsy marking clip is present along the lateral margin of this mass. A 1.4 cm cyst is adjacent to this mass medially at the approximate 12 o'clock location. Left breast: Biopsy related changes are present in the upper-outer left breast at site of biopsy proven atypical lobular hyperplasia with no associated abnormal enhancement. There is an irregular enhancing mass superior to the biopsy site in the upper outer left breast measuring 0.6 x 0.5 x 0.5 cm (series 5, image 112). There is an additional irregular enhancing mass in the upper outer left breast slightly anterior inferior to the prior biopsy site measuring 0.7 cm AP, 0.3 cm transverse and 0.3 cm craniocaudal (series 5, image 106) Lymph  nodes: No abnormal appearing lymph nodes. Ancillary findings:  None. IMPRESSION: 1. Biopsy proven malignancy in the right breast measures 1.7 x 1 x 0.7 cm. No additional sites of disease identified. 2. Two small suspicious enhancing masses in the upper-outer left breast. RECOMMENDATION: Recommend MRI guided biopsy of the 2 enhancing masses in the upper-outer left breast. BI-RADS CATEGORY  4: Suspicious. Electronically Signed   By: Everlean Alstrom M.D.   On: 07/19/2017 16:17   Mm Clip Placement Left  Result Date: 07/25/2017 CLINICAL DATA:  Evaluate biopsy markers after MRI guided biopsies. EXAM: DIAGNOSTIC LEFT MAMMOGRAM POST ULTRASOUND BIOPSY COMPARISON:  Previous exam(s). FINDINGS: Mammographic images were obtained following MRI guided biopsy of 2 left breast masses. Both of the biopsy markers are in good position and surrounded by post biopsy air. The markers left after today's MRI guided biopsy are annotated on today's images. IMPRESSION: Biopsy marker placement as above. Final Assessment: Post Procedure Mammograms for Marker Placement Electronically Signed   By: Dorise Bullion III M.D   On: 07/25/2017 09:37   Mr Lt Breast Bx Johnella Moloney Dev 1st Lesion Image Bx Spec Mr Guide  Result Date: 07/25/2017 CLINICAL DATA:  The patient had a recent biopsy in the left breast demonstrating ADH. A subsequent MRI demonstrated abnormal enhancement both superior and inferior to the biopsy site. EXAM: MRI GUIDED CORE NEEDLE BIOPSY OF THE LEFT BREAST TECHNIQUE:  Multiplanar, multisequence MR imaging of the left breast was performed both before and after administration of intravenous contrast. CONTRAST:  MultiHance COMPARISON:  July 19, 2017 FINDINGS: I met with the patient, and we discussed the procedure of MRI guided biopsy, including risks, benefits, and alternatives. Specifically, we discussed the risks of infection, bleeding, tissue injury, clip migration, and inadequate sampling. Informed, written consent was given.  The usual time out protocol was performed immediately prior to the procedure. Using sterile technique, 1% Lidocaine, MRI guidance, and a 9 gauge vacuum assisted device, biopsy was performed of the abnormal enhancement in the upper-outer left breast superior to the recent biopsy site at a mid depth using a lateral approach. At the conclusion of the procedure, a dumbbell-shaped tissue marker clip was deployed into the biopsy cavity. Follow-up 2-view mammogram was performed and dictated separately. Using sterile technique, 1% Lidocaine, MRI guidance, and a 9 gauge vacuum assisted device, biopsy was performed of the abnormal enhancement along the inferior aspect of the recent biopsy site at an anterior depth using a lateral approach. At the conclusion of the procedure, a cylinder shaped tissue marker clip was deployed into the biopsy cavity. Follow-up 2-view mammogram was performed and dictated separately. IMPRESSION: MRI guided biopsy of 2 left breast lesions as above. No apparent complications. Electronically Signed   By: Dorise Bullion III M.D   On: 07/25/2017 09:08   Mr Lt Breast Bx W Loc Dev Ea Add Lesion Image Bx Spec Mr Guide  Result Date: 07/25/2017 CLINICAL DATA:  The patient had a recent biopsy in the left breast demonstrating ADH. A subsequent MRI demonstrated abnormal enhancement both superior and inferior to the biopsy site. EXAM: MRI GUIDED CORE NEEDLE BIOPSY OF THE LEFT BREAST TECHNIQUE: Multiplanar, multisequence MR imaging of the left breast was performed both before and after administration of intravenous contrast. CONTRAST:  MultiHance COMPARISON:  July 19, 2017 FINDINGS: I met with the patient, and we discussed the procedure of MRI guided biopsy, including risks, benefits, and alternatives. Specifically, we discussed the risks of infection, bleeding, tissue injury, clip migration, and inadequate sampling. Informed, written consent was given. The usual time out protocol was performed  immediately prior to the procedure. Using sterile technique, 1% Lidocaine, MRI guidance, and a 9 gauge vacuum assisted device, biopsy was performed of the abnormal enhancement in the upper-outer left breast superior to the recent biopsy site at a mid depth using a lateral approach. At the conclusion of the procedure, a dumbbell-shaped tissue marker clip was deployed into the biopsy cavity. Follow-up 2-view mammogram was performed and dictated separately. Using sterile technique, 1% Lidocaine, MRI guidance, and a 9 gauge vacuum assisted device, biopsy was performed of the abnormal enhancement along the inferior aspect of the recent biopsy site at an anterior depth using a lateral approach. At the conclusion of the procedure, a cylinder shaped tissue marker clip was deployed into the biopsy cavity. Follow-up 2-view mammogram was performed and dictated separately. IMPRESSION: MRI guided biopsy of 2 left breast lesions as above. No apparent complications. Electronically Signed   By: Dorise Bullion III M.D   On: 07/25/2017 09:08      IMPRESSION: 46 year-old woman with stage IA (clinical T1c N0) invasive lobular carcinoma of the right breast, grade 1, ER/PR positive, HER-2 negative.   Biopsy of the left side showed atypical lobular hyperplasia. However Breast MRI revealed two suspicious enhancing masses within the upper-outer left breast. These areas underwent MRI-guided biopsy earlier today, results pending. Genetics are also pending  and we should have the results later this week. We discussed radiation treatment in general terms and she seems to be interested in breast conservation therapy.   PLAN: Await biopsy and genetic testing results to recommend surgical and radiation treatment approach.       ------------------------------------------------  Blair Promise, PhD, MD  This document serves as a record of services personally performed by [name], MD. It was created on his behalf by Arlyce Harman, a trained medical scribe. The creation of this record is based on the scribe's personal observations and the provider's statements to them. This document has been checked and approved by the attending provider.

## 2017-07-25 NOTE — Telephone Encounter (Signed)
[place] appt per 10/16 los - patient is aware of appt date and time.

## 2017-07-27 ENCOUNTER — Telehealth: Payer: Self-pay | Admitting: *Deleted

## 2017-07-27 ENCOUNTER — Telehealth: Payer: Self-pay | Admitting: Genetic Counselor

## 2017-07-27 NOTE — Telephone Encounter (Signed)
  Oncology Nurse Navigator Documentation  Navigator Location: [facility] (07/27/17 0900) Referral date to RadOnc/MedOnc: 07/17/17 (07/27/17 0900) )Navigator Encounter Type: Introductory phone call (07/27/17 0900)   Abnormal Finding Date: 07/06/17 (07/27/17 0900) Confirmed Diagnosis Date: 07/12/17 (07/27/17 0900)   Genetic Counseling Date: 07/18/17 (07/27/17 0900) Genetic Counseling Type: Urgent (07/27/17 0900)       Treatment Initiated Date: 07/24/17 (07/27/17 0900)                      Acuity: Level 2 (07/27/17 0900)   Acuity Level 2: Initial guidance, education and coordination as needed;Educational needs;Assistance expediting appointments;Ongoing guidance and education throughout treatment as needed (07/27/17 0900)     Time Spent with Patient: 15 (07/27/17 0900)

## 2017-07-27 NOTE — Telephone Encounter (Signed)
pateint tested negative on the 9 gene STAT panel.  This is consistent with her mother's genetic test result.  We will reflex to the 83 gene panel.

## 2017-07-27 NOTE — Telephone Encounter (Signed)
LM on VM with good news.  Asked that she CB. 

## 2017-07-31 ENCOUNTER — Telehealth: Payer: Self-pay | Admitting: Genetic Counselor

## 2017-07-31 ENCOUNTER — Ambulatory Visit: Payer: Self-pay | Admitting: Genetic Counselor

## 2017-07-31 ENCOUNTER — Encounter: Payer: Self-pay | Admitting: Genetic Counselor

## 2017-07-31 DIAGNOSIS — C50211 Malignant neoplasm of upper-inner quadrant of right female breast: Secondary | ICD-10-CM

## 2017-07-31 DIAGNOSIS — Z8042 Family history of malignant neoplasm of prostate: Secondary | ICD-10-CM

## 2017-07-31 DIAGNOSIS — Z1379 Encounter for other screening for genetic and chromosomal anomalies: Secondary | ICD-10-CM | POA: Insufficient documentation

## 2017-07-31 DIAGNOSIS — Z170 Estrogen receptor positive status [ER+]: Secondary | ICD-10-CM

## 2017-07-31 DIAGNOSIS — Z803 Family history of malignant neoplasm of breast: Secondary | ICD-10-CM

## 2017-07-31 NOTE — Telephone Encounter (Addendum)
Revealed negative genetic testing.  Discussed that we do not know why she has breast cancer or why there is cancer in the family. It could be due to a different gene that we are not testing, or maybe our current technology may not be able to pick something up.  It will be important for her to keep in contact with genetics to keep up with whether additional testing may be needed. 

## 2017-07-31 NOTE — Progress Notes (Signed)
HPI: Marie Castro was previously seen in the [facility] due to a personal and family history of cancer and concerns regarding a hereditary predisposition to cancer. Please refer to our prior cancer genetics clinic note for more information regarding Marie Castro's medical, social and family histories, and our assessment and recommendations, at the time. Marie Castro recent genetic test results were disclosed to her, as were recommendations warranted by these results. These results and recommendations are discussed in more detail below.  CANCER HISTORY:    Malignant neoplasm of upper-inner quadrant of right breast in female, estrogen receptor positive ([place])   07/24/2017 Initial Diagnosis    Malignant neoplasm of upper-inner quadrant of right breast in female, estrogen receptor positive ([place])     07/27/2017 Genetic Testing    Negative genetic testing on the 9 gene STAT panel.  The STAT Breast cancer panel offered by [facility] includes sequencing and rearrangement analysis for the following 9 genes:  ATM, BRCA1, BRCA2, CDH1, CHEK2, PALB2, PTEN, STK11 and TP53.   Reflexed to the multi-gene panel.  Negative genetic testing on the Multi-gene panel.  The Multi-Gene Panel offered by [facility] includes sequencing and/or deletion duplication testing of the following 80 genes: ALK, APC, ATM, AXIN2,BAP1,  BARD1, BLM, BMPR1A, BRCA1, BRCA2, BRIP1, CASR, CDC73, CDH1, CDK4, CDKN1B, CDKN1C, CDKN2A (p14ARF), CDKN2A (p16INK4a), CEBPA, CHEK2, CTNNA1, DICER1, DIS3L2, EGFR (c.2369C>T, p.Thr790Met variant only), EPCAM (Deletion/duplication testing only), FH, FLCN, GATA2, GPC3, GREM1 (Promoter region deletion/duplication testing only), HOXB13 (c.251G>A, p.Gly84Glu), HRAS, KIT, MAX, MEN1, MET, MITF (c.952G>A, p.Glu318Lys variant only), MLH1, MSH2, MSH3, MSH6, MUTYH, NBN, NF1, NF2, NTHL1, PALB2, PDGFRA, PHOX2B, PMS2, POLD1, POLE, POT1, PRKAR1A, PTCH1, PTEN, RAD50, RAD51C, RAD51D, RB1, RECQL4, RET, RUNX1, SDHAF2, SDHA  (sequence changes only), SDHB, SDHC, SDHD, SMAD4, SMARCA4, SMARCB1, SMARCE1, STK11, SUFU, TERT, TERT, TMEM127, TP53, TSC1, TSC2, VHL, WRN and WT1.  The report date is July 27, 2017.        FAMILY HISTORY:  We obtained a detailed, 4-generation family history.  Significant diagnoses are listed below: Family History  Problem Relation Age of Onset  . Cancer Mother        SKIN AND BREAST  . Heart disease Mother   . Heart disease Father   . Hyperlipidemia Father   . Depression Father   . Cancer Maternal Grandmother        BREAST  . Melanoma Maternal Grandmother        toe  . Cancer Maternal Grandfather        PROSTATE  . Irritable bowel syndrome Paternal Grandmother   . Heart disease Paternal Grandfather   . Breast cancer Maternal Aunt 72  . Breast cancer Maternal Aunt        dx in her 58s    The patient has two children who are cancer free.  She has one sister who is also cancer free.  Both parents are living.    The patient's father has heart disease and is 24.  He has a brother who is cancer free. The paternal grandparents are deceased from heart disease and complications from IBS.   The patient's mother was diagnosed with breast cancer at 78.  She has also had several skin cancers. She had one brother and two sisters.  Both sisters developed breast cancer, one in her 31's and the other at 19.  The maternal grandparents are deceased.  The grandfather died of prostate cancer and the grandmother had breast cancer before she died.  Patient's maternal ancestors are  of [place] descent, and paternal ancestors are of [place] descent. There is no reported Ashkenazi Jewish ancestry. There is no known consanguinity.  GENETIC TEST RESULTS: Genetic testing reported out on July 27, 2017 through the STAT and Multi-gene cancer panel found no deleterious mutations.  The Multi-Gene Panel offered by [facility] includes sequencing and/or deletion duplication testing of the following  80 genes: ALK, APC, ATM, AXIN2,BAP1,  BARD1, BLM, BMPR1A, BRCA1, BRCA2, BRIP1, CASR, CDC73, CDH1, CDK4, CDKN1B, CDKN1C, CDKN2A (p14ARF), CDKN2A (p16INK4a), CEBPA, CHEK2, CTNNA1, DICER1, DIS3L2, EGFR (c.2369C>T, p.Thr790Met variant only), EPCAM (Deletion/duplication testing only), FH, FLCN, GATA2, GPC3, GREM1 (Promoter region deletion/duplication testing only), HOXB13 (c.251G>A, p.Gly84Glu), HRAS, KIT, MAX, MEN1, MET, MITF (c.952G>A, p.Glu318Lys variant only), MLH1, MSH2, MSH3, MSH6, MUTYH, NBN, NF1, NF2, NTHL1, PALB2, PDGFRA, PHOX2B, PMS2, POLD1, POLE, POT1, PRKAR1A, PTCH1, PTEN, RAD50, RAD51C, RAD51D, RB1, RECQL4, RET, RUNX1, SDHAF2, SDHA (sequence changes only), SDHB, SDHC, SDHD, SMAD4, SMARCA4, SMARCB1, SMARCE1, STK11, SUFU, TERT, TERT, TMEM127, TP53, TSC1, TSC2, VHL, WRN and WT1.  The test report has been scanned into EPIC and is located under the Molecular Pathology section of the Results Review tab.   We discussed with Marie Castro that since the current genetic testing is not perfect, it is possible there may be a gene mutation in one of these genes that current testing cannot detect, but that chance is small. We also discussed, that it is possible that another gene that has not yet been discovered, or that we have not yet tested, is responsible for the cancer diagnoses in the family, and it is, therefore, important to remain in touch with cancer genetics in the future so that we can continue to offer Marie Castro the most up to date genetic testing.     CANCER SCREENING RECOMMENDATIONS:  Given Marie Castro's personal and family histories, we must interpret these negative results with some caution.  Families with features suggestive of hereditary risk for cancer tend to have multiple family members with cancer, diagnoses in multiple generations and diagnoses before the age of 67. Marie Castro family exhibits some of these features. Thus this result may simply reflect our current inability to detect all mutations  within these genes or there may be a different gene that has not yet been discovered or tested.   RECOMMENDATIONS FOR FAMILY MEMBERS: Women in this family might be at some increased risk of developing cancer, over the general population risk, simply due to the family history of cancer. We recommended women in this family have a yearly mammogram beginning at age 27, or 57 years younger than the earliest onset of cancer, an annual clinical breast exam, and perform monthly breast self-exams. Women in this family should also have a gynecological exam as recommended by their primary provider. All family members should have a colonoscopy by age 68.  FOLLOW-UP: Lastly, we discussed with Marie Castro that cancer genetics is a rapidly advancing field and it is possible that new genetic tests will be appropriate for her and/or her family members in the future. We encouraged her to remain in contact with cancer genetics on an annual basis so we can update her personal and family histories and let her know of advances in cancer genetics that may benefit this family.   Our contact number was provided. Marie Castro questions were answered to her satisfaction, and she knows she is welcome to call us at anytime with additional questions or concerns.   Roma Kayser, MS, [facility] Certified Genetic Counselor [email or web address]

## 2017-08-06 ENCOUNTER — Other Ambulatory Visit: Payer: Self-pay | Admitting: General Surgery

## 2017-08-06 DIAGNOSIS — C50412 Malignant neoplasm of upper-outer quadrant of left female breast: Secondary | ICD-10-CM

## 2017-08-06 DIAGNOSIS — C50011 Malignant neoplasm of nipple and areola, right female breast: Secondary | ICD-10-CM

## 2017-08-06 DIAGNOSIS — Z170 Estrogen receptor positive status [ER+]: Principal | ICD-10-CM

## 2017-08-06 DIAGNOSIS — C50012 Malignant neoplasm of nipple and areola, left female breast: Principal | ICD-10-CM

## 2017-08-06 DIAGNOSIS — C50411 Malignant neoplasm of upper-outer quadrant of right female breast: Secondary | ICD-10-CM

## 2017-08-09 NOTE — Pre-Procedure Instructions (Signed)
Marie Castro  08/09/2017      [id number] - [place], [place] [address] AT [address] [place] [address] Phone: [phone] Fax: [phone]    Your procedure is scheduled on Thursday November 8.  Report to [facility] Admitting at 10:00 A.M.  Call this number if you have problems the morning of surgery:  [phone]   Remember:  Do not eat food or drink liquids after midnight.  ** DRINK BOOST BREEZE drink 2 hours prior to arrival to the hospital (08:00AM)**   Take these medicines the morning of surgery with A SIP OF WATER:  levothyroxine (synthroid) tamoxifen (nolvadex)  7 days prior to surgery STOP taking any Aspirin (unless otherwise instructed by your surgeon), Aleve, Naproxen, Ibuprofen, Motrin, Advil, Goody's, BC's, all herbal medications, fish oil, and all vitamins    Do not wear jewelry, make-up or nail polish.  Do not wear lotions, powders, or perfumes, or deoderant.  Do not shave 48 hours prior to surgery.  Men may shave face and neck.  Do not bring valuables to the hospital.  [facility] is not responsible for any belongings or valuables.  Contacts, dentures or bridgework may not be worn into surgery.  Leave your suitcase in the car.  After surgery it may be brought to your room.  For patients admitted to the hospital, discharge time will be determined by your treatment team.  Patients discharged the day of surgery will not be allowed to drive home.   Special instructions:    [place]- Preparing For Surgery  Before surgery, you can play an important role. Because skin is not sterile, your skin needs to be as free of germs as possible. You can reduce the number of germs on your skin by washing with CHG (chlorahexidine gluconate) Soap before surgery.  CHG is an antiseptic cleaner which kills germs and bonds with the skin to continue killing germs even after washing.  Please do not  use if you have an allergy to CHG or antibacterial soaps. If your skin becomes reddened/irritated stop using the CHG.  Do not shave (including legs and underarms) for at least 48 hours prior to first CHG shower. It is OK to shave your face.  Please follow these instructions carefully.   1. Shower the NIGHT BEFORE SURGERY and the MORNING OF SURGERY with CHG.   2. If you chose to wash your hair, wash your hair first as usual with your normal shampoo.  3. After you shampoo, rinse your hair and body thoroughly to remove the shampoo.  4. Use CHG as you would any other liquid soap. You can apply CHG directly to the skin and wash gently with a scrungie or a clean washcloth.   5. Apply the CHG Soap to your body ONLY FROM THE NECK DOWN.  Do not use on open wounds or open sores. Avoid contact with your eyes, ears, mouth and genitals (private parts). Wash Face and genitals (private parts)  with your normal soap.  6. Wash thoroughly, paying special attention to the area where your surgery will be performed.  7. Thoroughly rinse your body with warm water from the neck down.  8. DO NOT shower/wash with your normal soap after using and rinsing off the CHG Soap.  9. Pat yourself dry with a CLEAN TOWEL.  10. Wear CLEAN PAJAMAS to bed the night before surgery, wear comfortable clothes the morning of surgery  11. Place  CLEAN SHEETS on your bed the night of your first shower and DO NOT SLEEP WITH PETS.    Day of Surgery: Do not apply any deodorants/lotions. Please wear clean clothes to the hospital/surgery center.      Please read over the following fact sheets that you were given. Coughing and Deep Breathing

## 2017-08-10 ENCOUNTER — Encounter (HOSPITAL_COMMUNITY): Payer: Self-pay

## 2017-08-10 ENCOUNTER — Encounter (HOSPITAL_COMMUNITY)
Admission: RE | Admit: 2017-08-10 | Discharge: 2017-08-10 | Disposition: A | Discharge status: Home / Self Care | Payer: BC Managed Care – PPO | Source: Ambulatory Visit | Attending: General Surgery | Admitting: General Surgery

## 2017-08-10 DIAGNOSIS — Z01812 Encounter for preprocedural laboratory examination: Secondary | ICD-10-CM | POA: Diagnosis not present

## 2017-08-10 HISTORY — DX: Hypothyroidism, unspecified: E03.9

## 2017-08-10 HISTORY — DX: Malignant (primary) neoplasm, unspecified: C80.1

## 2017-08-10 HISTORY — DX: Anxiety disorder, unspecified: F41.9

## 2017-08-10 LAB — BASIC METABOLIC PANEL
Anion gap: 4 — ABNORMAL LOW (ref 5–15)
BUN: 10 mg/dL (ref 6–20)
CO2: 28 mmol/L (ref 22–32)
CREATININE: 0.89 mg/dL (ref 0.44–1.00)
Calcium: 9.3 mg/dL (ref 8.9–10.3)
Chloride: 104 mmol/L (ref 101–111)
GFR calc Af Amer: >60 mL/min (ref >60)
Glucose, Bld: 75 mg/dL (ref 65–99)
Potassium: 4.4 mmol/L (ref 3.5–5.1)
SODIUM: 136 mmol/L (ref 135–145)

## 2017-08-10 LAB — CBC
HCT: 40.8 % (ref 36.0–46.0)
Hemoglobin: 13.7 g/dL (ref 12.0–15.0)
MCH: 29.1 pg (ref 26.0–34.0)
MCHC: 33.6 g/dL (ref 30.0–36.0)
MCV: 86.8 fL (ref 78.0–100.0)
PLATELETS: 287 10*3/uL (ref 150–400)
RBC: 4.7 MIL/uL (ref 3.87–5.11)
RDW: 12.4 % (ref 11.5–15.5)
WBC: 5.5 10*3/uL (ref 4.0–10.5)

## 2017-08-10 LAB — HCG, SERUM, QUALITATIVE: PREG SERUM: NEGATIVE

## 2017-08-10 NOTE — Pre-Procedure Instructions (Signed)
Marie Castro  08/10/2017      [id number] - [place], [place] - [address]. AT [address] [place] [address] Phone: [phone] Fax: [phone]    Your procedure is scheduled on Thursday November 8.  Report to [facility] Admitting at 10:30 A.M.  Call this number if you have problems the morning of surgery:  [phone]   Remember:  Do not eat food or drink liquids after midnight.  ** DRINK BOOST BREEZE drink 2 hours prior to arrival to the hospital (08:30AM)**   Take these medicines the morning of surgery with A SIP OF WATER:  levothyroxine (synthroid) tamoxifen (nolvadex)  7 days prior to surgery STOP taking any Aspirin (unless otherwise instructed by your surgeon), Aleve, Naproxen, Ibuprofen, Motrin, Advil, Goody's, BC's, all herbal medications, fish oil, and all vitamins    Do not wear jewelry, make-up or nail polish.  Do not wear lotions, powders, or perfumes, or deoderant.  Do not shave 48 hours prior to surgery.  Men may shave face and neck.  Do not bring valuables to the hospital.  [facility] is not responsible for any belongings or valuables.  Contacts, dentures or bridgework may not be worn into surgery.  Leave your suitcase in the car.  After surgery it may be brought to your room.  For patients admitted to the hospital, discharge time will be determined by your treatment team.  Patients discharged the day of surgery will not be allowed to drive home.   Special instructions:    [place]- Preparing For Surgery  Before surgery, you can play an important role. Because skin is not sterile, your skin needs to be as free of germs as possible. You can reduce the number of germs on your skin by washing with CHG (chlorahexidine gluconate) Soap before surgery.  CHG is an antiseptic cleaner which kills germs and bonds with the skin to continue killing germs even after washing.  Please do not  use if you have an allergy to CHG or antibacterial soaps. If your skin becomes reddened/irritated stop using the CHG.  Do not shave (including legs and underarms) for at least 48 hours prior to first CHG shower. It is OK to shave your face.  Please follow these instructions carefully.   1. Shower the NIGHT BEFORE SURGERY and the MORNING OF SURGERY with CHG.   2. If you chose to wash your hair, wash your hair first as usual with your normal shampoo.  3. After you shampoo, rinse your hair and body thoroughly to remove the shampoo.  4. Use CHG as you would any other liquid soap. You can apply CHG directly to the skin and wash gently with a scrungie or a clean washcloth.   5. Apply the CHG Soap to your body ONLY FROM THE NECK DOWN.  Do not use on open wounds or open sores. Avoid contact with your eyes, ears, mouth and genitals (private parts). Wash Face and genitals (private parts)  with your normal soap.  6. Wash thoroughly, paying special attention to the area where your surgery will be performed.  7. Thoroughly rinse your body with warm water from the neck down.  8. DO NOT shower/wash with your normal soap after using and rinsing off the CHG Soap.  9. Pat yourself dry with a CLEAN TOWEL.  10. Wear CLEAN PAJAMAS to bed the night before surgery, wear comfortable clothes the morning of surgery  11. Place  CLEAN SHEETS on your bed the night of your first shower and DO NOT SLEEP WITH PETS.    Day of Surgery: Do not apply any deodorants/lotions. Please wear clean clothes to the hospital/surgery center.      Please read over the following fact sheets that you were given. Coughing and Deep Breathing

## 2017-08-15 NOTE — H&P (Signed)
Marie Castro 08/06/2017 9:53 AM Location: [facility] Patient #: [id number] DOB: 1971-04-01 Married / Language: English / Race: White Female   History of Present Illness [name] MD; 08/06/2017 6:57 PM) The patient is a 46 year old female who presents for a follow-up for Breast cancer. Patient is a lovely 46 year old female referred for consultation by Dr. Isaiah Blakes with a new diagnosis of right breast cancer in October 2018. The patient presented with bilateral screening detected abnormalities. She underwent diagnostic imaging. She was found to have a 3 mm area of calcifications in the left breast upper outer quadrant middle depth as well as an 8 mm mass in the left breast posterior depth with indistinct margins. The right breast showed a irregular mass with an indistinct margin in the upper inner quadrant middle depth process adjacent to a cyst. This is suspicious. The posteriorly located abnormality on the left was found to be a cyst. The left-sided calcifications were also suspicious. Core needle biopsies were performed of both of these areas. The left side was found to be atypical lobular hyperplasia in addition to fibrocystic changes without diagnosis. 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 The prognostic panel as well as E-cadherin have not been resulted yet. The patient has breast density category C.  The patient has significant family history of breast cancer. Her mother as well as maternal grandmother had breast cancer in their 33s. She has 2 maternal aunts with one having breast cancer at age 66 and one at age 99. Her mother had genetic testing within the last 5 years which was negative. The patient has not had problems with her breasts before.  Due to her breast density, family history, and young age, a breast MRI was obtained. This showed two additional concerning areas on the left. These were both biopsied and were mammary carcinoma in  situ, ductal phenotype. This was reviewed with radiology and maximal distance between clips is 3.7 cm vertically.   Genetics was negative for identifiable genetic defect. She is here to discuss surgery.   Imaging is reviewed.   Pathology is reviwed.   Allergies Malachy Moan, [place]; 08/06/2017 9:53 AM) No Known Allergies 07/16/2017  Medication History Malachy Moan, [place]; 08/06/2017 9:54 AM) Tamoxifen Citrate (20MG  Tablet, Oral) Active. Levothyroxine Sodium (100MCG Tablet, Oral) Active. DiphenhydrAMINE HCl (Oral) Specific strength unknown - Active. LORazepam (0.5MG  Tablet, Oral as needed) Active. Ibuprofen (100MG  Tablet, Oral) Active. Medications Reconciled    Review of Systems [name] MD; 08/06/2017 6:57 PM) All other systems negative  Vitals Malachy Moan RMA; 08/06/2017 9:54 AM) 08/06/2017 9:54 AM Weight: 154 lb Height: 66in Body Surface Area: 1.79 m Body Mass Index: 24.86 kg/m  Temp.: 97.63F  Pulse: 71 (Regular)  BP: 100/80 (Sitting, Left Arm, Standard)       Physical Exam [name] MD; 08/06/2017 6:58 PM) General Mental Status-Alert. General Appearance-Consistent with stated age. Hydration-Well hydrated. Voice-Normal.  Eye Sclera/Conjunctiva - Bilateral-No scleral icterus. Note: pupils equal   Chest and Lung Exam Chest and lung exam reveals -quiet, even and easy respiratory effort with no use of accessory muscles. Inspection Chest Wall - Normal. Back - normal.  Cardiovascular Cardiovascular examination reveals -normal pedal pulses bilaterally. Note: regular rate and rhythm  Peripheral Vascular Upper Extremity Inspection - Bilateral - Normal - No Clubbing, No Cyanosis, No Edema, Pulses Intact. Lower Extremity Palpation - Edema - Bilateral - No edema.  Neurologic Neurologic evaluation reveals -alert and oriented x 3 with no impairment of recent  or remote memory. Mental  Status-Normal.  Musculoskeletal Global Assessment -Note: no gross deformities.  Normal Exam - Left-Upper Extremity Strength Normal and Lower Extremity Strength Normal. Normal Exam - Right-Upper Extremity Strength Normal and Lower Extremity Strength Normal.    Assessment & Plan [name] MD; 08/06/2017 6:59 PM) PRIMARY CANCER OF UPPER INNER QUADRANT OF RIGHT FEMALE BREAST (C50.211) Impression: Will plan seed localized lumpectomy wtih right sentinel node biopsy for side with invasive cancer. Will plan bracketed seed localized partial mastectomy on left. Since no invasive cancer was seen on that side, will not plan a sentinel node on that side. It is, however, a larger area to remove.  The surgical procedure was described to the patient. I discussed the incision type and location and that we would need radiology involved on with a wire or seed marker and/or sentinel node.  The risks and benefits of the procedure were described to the patient and she wishes to proceed.  We discussed the risks bleeding, infection, damage to other structures, need for further procedures/surgeries. We discussed the risk of seroma. The patient was advised if the area in the breast in cancer, we may need to go back to surgery for additional tissue to obtain negative margins or for a lymph node biopsy. The patient was advised that these are the most common complications, but that others can occur as well. They were advised against taking aspirin or other anti-inflammatory agents/blood thinners the week before surgery. Current Plans Schedule for Surgery Pt Education - [facility] breast cancer surgery: discussed with patient and provided information. 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    Signed by [name], MD (08/06/2017 7:00 PM)

## 2017-08-15 NOTE — H&P (View-Only) (Signed)
Marie Castro 08/06/2017 9:53 AM Location: [facility] Patient #: [id number] DOB: 1971/04/14 Married / Language: English / Race: White Female   History of Present Illness Marie Klein MD; 08/06/2017 6:57 PM) The patient is a 46 year old female who presents for a follow-up for Breast cancer. Patient is a lovely 46 year old female referred for consultation by Dr. Isaiah Castro with a new diagnosis of right breast cancer in October 2018. The patient presented with bilateral screening detected abnormalities. She underwent diagnostic imaging. She was found to have a 3 mm area of calcifications in the left breast upper outer quadrant middle depth as well as an 8 mm mass in the left breast posterior depth with indistinct margins. The right breast showed a irregular mass with an indistinct margin in the upper inner quadrant middle depth process adjacent to a cyst. This is suspicious. The posteriorly located abnormality on the left was found to be a cyst. The left-sided calcifications were also suspicious. Core needle biopsies were performed of both of these areas. The left side was found to be atypical lobular hyperplasia in addition to fibrocystic changes without diagnosis. 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 The prognostic panel as well as E-cadherin have not been resulted yet. The patient has breast density category C.  The patient has significant family history of breast cancer. Her mother as well as maternal grandmother had breast cancer in their 3s. She has 2 maternal aunts with one having breast cancer at age 54 and one at age 48. Her mother had genetic testing within the last 5 years which was negative. The patient has not had problems with her breasts before.  Due to her breast density, family history, and young age, a breast MRI was obtained. This showed two additional concerning areas on the left. These were both biopsied and were mammary carcinoma in  situ, ductal phenotype. This was reviewed with radiology and maximal distance between clips is 3.7 cm vertically.   Genetics was negative for identifiable genetic defect. She is here to discuss surgery.   Imaging is reviewed.   Pathology is reviwed.   Allergies Marie Castro, [place]; 08/06/2017 9:53 AM) No Known Allergies 07/16/2017  Medication History Marie Castro, [place]; 08/06/2017 9:54 AM) Tamoxifen Citrate (20MG  Tablet, Oral) Active. Levothyroxine Sodium (100MCG Tablet, Oral) Active. DiphenhydrAMINE HCl (Oral) Specific strength unknown - Active. LORazepam (0.5MG  Tablet, Oral as needed) Active. Ibuprofen (100MG  Tablet, Oral) Active. Medications Reconciled    Review of Systems Marie Klein MD; 08/06/2017 6:57 PM) All other systems negative  Vitals Marie Castro RMA; 08/06/2017 9:54 AM) 08/06/2017 9:54 AM Weight: 154 lb Height: 66in Body Surface Area: 1.79 m Body Mass Index: 24.86 kg/m  Temp.: 97.44F  Pulse: 71 (Regular)  BP: 100/80 (Sitting, Left Arm, Standard)       Physical Exam Marie Klein MD; 08/06/2017 6:58 PM) General Mental Status-Alert. General Appearance-Consistent with stated age. Hydration-Well hydrated. Voice-Normal.  Eye Sclera/Conjunctiva - Bilateral-No scleral icterus. Note: pupils equal   Chest and Lung Exam Chest and lung exam reveals -quiet, even and easy respiratory effort with no use of accessory muscles. Inspection Chest Wall - Normal. Back - normal.  Cardiovascular Cardiovascular examination reveals -normal pedal pulses bilaterally. Note: regular rate and rhythm  Peripheral Vascular Upper Extremity Inspection - Bilateral - Normal - No Clubbing, No Cyanosis, No Edema, Pulses Intact. Lower Extremity Palpation - Edema - Bilateral - No edema.  Neurologic Neurologic evaluation reveals -alert and oriented x 3 with no impairment of recent  or remote memory. Mental  Status-Normal.  Musculoskeletal Global Assessment -Note: no gross deformities.  Normal Exam - Left-Upper Extremity Strength Normal and Lower Extremity Strength Normal. Normal Exam - Right-Upper Extremity Strength Normal and Lower Extremity Strength Normal.    Assessment & Plan Marie Klein MD; 08/06/2017 6:59 PM) PRIMARY CANCER OF UPPER INNER QUADRANT OF RIGHT FEMALE BREAST (C50.211) Impression: Will plan seed localized lumpectomy wtih right sentinel node biopsy for side with invasive cancer. Will plan bracketed seed localized partial mastectomy on left. Since no invasive cancer was seen on that side, will not plan a sentinel node on that side. It is, however, a larger area to remove.  The surgical procedure was described to the patient. I discussed the incision type and location and that we would need radiology involved on with a wire or seed marker and/or sentinel node.  The risks and benefits of the procedure were described to the patient and she wishes to proceed.  We discussed the risks bleeding, infection, damage to other structures, need for further procedures/surgeries. We discussed the risk of seroma. The patient was advised if the area in the breast in cancer, we may need to go back to surgery for additional tissue to obtain negative margins or for a lymph node biopsy. The patient was advised that these are the most common complications, but that others can occur as well. They were advised against taking aspirin or other anti-inflammatory agents/blood thinners the week before surgery. Current Plans Schedule for Surgery Pt Education - [facility] breast cancer surgery: discussed with patient and provided information. 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    Signed by Marie Klein, MD (08/06/2017 7:00 PM)

## 2017-08-16 ENCOUNTER — Ambulatory Visit (HOSPITAL_COMMUNITY)
Admission: RE | Admit: 2017-08-16 | Discharge: 2017-08-16 | Disposition: A | Discharge status: Home / Self Care | Payer: BC Managed Care – PPO | Source: Ambulatory Visit | Attending: General Surgery | Admitting: General Surgery

## 2017-08-16 ENCOUNTER — Encounter (HOSPITAL_COMMUNITY)
Admission: RE | Disposition: A | Discharge status: Home / Self Care | Payer: Self-pay | Source: Ambulatory Visit | Attending: General Surgery

## 2017-08-16 ENCOUNTER — Ambulatory Visit (HOSPITAL_COMMUNITY): Payer: BC Managed Care – PPO | Admitting: Anesthesiology

## 2017-08-16 ENCOUNTER — Encounter (HOSPITAL_COMMUNITY): Payer: Self-pay

## 2017-08-16 DIAGNOSIS — D0512 Intraductal carcinoma in situ of left breast: Secondary | ICD-10-CM | POA: Diagnosis not present

## 2017-08-16 DIAGNOSIS — C50412 Malignant neoplasm of upper-outer quadrant of left female breast: Secondary | ICD-10-CM

## 2017-08-16 DIAGNOSIS — Z803 Family history of malignant neoplasm of breast: Secondary | ICD-10-CM | POA: Diagnosis not present

## 2017-08-16 DIAGNOSIS — Z79810 Long term (current) use of selective estrogen receptor modulators (SERMs): Secondary | ICD-10-CM | POA: Diagnosis not present

## 2017-08-16 DIAGNOSIS — Z170 Estrogen receptor positive status [ER+]: Principal | ICD-10-CM

## 2017-08-16 DIAGNOSIS — Z79899 Other long term (current) drug therapy: Secondary | ICD-10-CM | POA: Insufficient documentation

## 2017-08-16 DIAGNOSIS — C50211 Malignant neoplasm of upper-inner quadrant of right female breast: Secondary | ICD-10-CM | POA: Diagnosis not present

## 2017-08-16 DIAGNOSIS — Z79890 Hormone replacement therapy (postmenopausal): Secondary | ICD-10-CM | POA: Insufficient documentation

## 2017-08-16 HISTORY — PX: BREAST LUMPECTOMY WITH RADIOACTIVE SEED LOCALIZATION: SHX6424

## 2017-08-16 HISTORY — PX: BREAST LUMPECTOMY WITH RADIOACTIVE SEED AND SENTINEL LYMPH NODE BIOPSY: SHX6550

## 2017-08-16 SURGERY — BREAST LUMPECTOMY WITH RADIOACTIVE SEED AND SENTINEL LYMPH NODE BIOPSY
Anesthesia: General | Site: Breast | Laterality: Right

## 2017-08-16 MED ORDER — SODIUM CHLORIDE 0.9 % IV SOLN: 250.0000 mL | INTRAVENOUS | Status: DC | PRN

## 2017-08-16 MED ORDER — PROPOFOL 10 MG/ML IV BOLUS
INTRAVENOUS | Status: DC | PRN
  Administered 2017-08-16: 175 mg via INTRAVENOUS

## 2017-08-16 MED ORDER — GABAPENTIN 300 MG PO CAPS
300.0000 mg | ORAL_CAPSULE | ORAL | Status: AC
  Administered 2017-08-16: 300 mg via ORAL

## 2017-08-16 MED ORDER — SODIUM CHLORIDE 0.9% FLUSH: 3.0000 mL | INTRAVENOUS | Status: DC | PRN

## 2017-08-16 MED ORDER — OXYCODONE HCL 5 MG/5ML PO SOLN: 5.0000 mg | Freq: Once | ORAL | Status: DC | PRN

## 2017-08-16 MED ORDER — METHYLENE BLUE 0.5 % INJ SOLN
INTRAVENOUS | Status: AC
  Filled 2017-08-16: qty 10

## 2017-08-16 MED ORDER — ONDANSETRON HCL 4 MG/2ML IJ SOLN: 4.0000 mg | Freq: Once | INTRAMUSCULAR | Status: DC | PRN

## 2017-08-16 MED ORDER — OXYCODONE HCL 5 MG PO TABS: 5.0000 mg | ORAL_TABLET | Freq: Once | ORAL | Status: DC | PRN

## 2017-08-16 MED ORDER — HYDROMORPHONE HCL 1 MG/ML IJ SOLN
INTRAMUSCULAR | Status: AC
  Administered 2017-08-16: 0.5 mg via INTRAVENOUS
  Filled 2017-08-16: qty 1

## 2017-08-16 MED ORDER — CEFAZOLIN SODIUM-DEXTROSE 2-4 GM/100ML-% IV SOLN
2.0000 g | INTRAVENOUS | Status: AC
  Administered 2017-08-16: 2 g via INTRAVENOUS

## 2017-08-16 MED ORDER — OXYCODONE HCL 5 MG PO TABS: 5.0000 mg | ORAL_TABLET | ORAL | Status: DC | PRN

## 2017-08-16 MED ORDER — DEXAMETHASONE SODIUM PHOSPHATE 10 MG/ML IJ SOLN
INTRAMUSCULAR | Status: DC | PRN
  Administered 2017-08-16: 10 mg via INTRAVENOUS

## 2017-08-16 MED ORDER — ACETAMINOPHEN 500 MG PO TABS
1000.0000 mg | ORAL_TABLET | ORAL | Status: AC
  Administered 2017-08-16: 1000 mg via ORAL

## 2017-08-16 MED ORDER — ONDANSETRON HCL 4 MG/2ML IJ SOLN
INTRAMUSCULAR | Status: DC | PRN
  Administered 2017-08-16: 4 mg via INTRAVENOUS

## 2017-08-16 MED ORDER — LACTATED RINGERS IV SOLN
INTRAVENOUS | Status: DC | PRN
  Administered 2017-08-16: 12:00:00 via INTRAVENOUS

## 2017-08-16 MED ORDER — ACETAMINOPHEN 650 MG RE SUPP: 650.0000 mg | RECTAL | Status: DC | PRN

## 2017-08-16 MED ORDER — TECHNETIUM TC 99M SULFUR COLLOID FILTERED
1.0000 | Freq: Once | INTRAVENOUS | Status: AC | PRN
  Administered 2017-08-16: 1 via INTRADERMAL

## 2017-08-16 MED ORDER — CELECOXIB 200 MG PO CAPS
200.0000 mg | ORAL_CAPSULE | ORAL | Status: AC
  Administered 2017-08-16: 200 mg via ORAL

## 2017-08-16 MED ORDER — MIDAZOLAM HCL 2 MG/2ML IJ SOLN
2.0000 mg | Freq: Once | INTRAMUSCULAR | Status: AC
  Administered 2017-08-16: 2 mg via INTRAVENOUS

## 2017-08-16 MED ORDER — CHLORHEXIDINE GLUCONATE CLOTH 2 % EX PADS: 6.0000 | MEDICATED_PAD | Freq: Once | CUTANEOUS | Status: DC

## 2017-08-16 MED ORDER — OXYCODONE HCL 5 MG PO TABS: 5.0000 mg | ORAL_TABLET | ORAL | 0 refills | Status: DC | PRN

## 2017-08-16 MED ORDER — SODIUM CHLORIDE 0.9% FLUSH: 3.0000 mL | Freq: Two times a day (BID) | INTRAVENOUS | Status: DC

## 2017-08-16 MED ORDER — 0.9 % SODIUM CHLORIDE (POUR BTL) OPTIME
TOPICAL | Status: DC | PRN
  Administered 2017-08-16: 1000 mL

## 2017-08-16 MED ORDER — FENTANYL CITRATE (PF) 100 MCG/2ML IJ SOLN
100.0000 ug | Freq: Once | INTRAMUSCULAR | Status: AC
  Administered 2017-08-16: 100 ug via INTRAVENOUS

## 2017-08-16 MED ORDER — HYDROMORPHONE HCL 1 MG/ML IJ SOLN
0.2500 mg | INTRAMUSCULAR | Status: DC | PRN
  Administered 2017-08-16 (×2): 0.5 mg via INTRAVENOUS

## 2017-08-16 MED ORDER — LIDOCAINE HCL (CARDIAC) 20 MG/ML IV SOLN
INTRAVENOUS | Status: DC | PRN
  Administered 2017-08-16: 50 mg via INTRAVENOUS

## 2017-08-16 MED ORDER — BUPIVACAINE-EPINEPHRINE (PF) 0.25% -1:200000 IJ SOLN
INTRAMUSCULAR | Status: DC | PRN
  Administered 2017-08-16: 20 mL

## 2017-08-16 MED ORDER — FENTANYL CITRATE (PF) 100 MCG/2ML IJ SOLN: 25.0000 ug | INTRAMUSCULAR | Status: DC | PRN

## 2017-08-16 MED ORDER — FENTANYL CITRATE (PF) 100 MCG/2ML IJ SOLN
INTRAMUSCULAR | Status: DC | PRN
  Administered 2017-08-16: 50 ug via INTRAVENOUS
  Administered 2017-08-16: 25 ug via INTRAVENOUS
  Administered 2017-08-16: 75 ug via INTRAVENOUS

## 2017-08-16 MED ORDER — MIDAZOLAM HCL 5 MG/5ML IJ SOLN
INTRAMUSCULAR | Status: DC | PRN
  Administered 2017-08-16: 1 mg via INTRAVENOUS

## 2017-08-16 MED ORDER — ACETAMINOPHEN 325 MG PO TABS: 650.0000 mg | ORAL_TABLET | ORAL | Status: DC | PRN

## 2017-08-16 SURGICAL SUPPLY — 64 items
ADH SKN CLS APL DERMABOND .7 (GAUZE/BANDAGES/DRESSINGS) ×4
BINDER BREAST LRG (GAUZE/BANDAGES/DRESSINGS) IMPLANT
BINDER BREAST XLRG (GAUZE/BANDAGES/DRESSINGS) IMPLANT
BLADE SURG 10 STRL SS (BLADE) ×4 IMPLANT
BNDG COHESIVE 4X5 TAN STRL (GAUZE/BANDAGES/DRESSINGS) ×3 IMPLANT
CANISTER SUCT 3000ML PPV (MISCELLANEOUS) ×3 IMPLANT
CHLORAPREP W/TINT 26ML (MISCELLANEOUS) ×3 IMPLANT
CLIP TI LARGE 6 (CLIP) ×1 IMPLANT
CLIP VESOCCLUDE LG 6/CT (CLIP) ×3 IMPLANT
CLIP VESOCCLUDE MED 6/CT (CLIP) ×3 IMPLANT
CLIP VESOCCLUDE SM WIDE 6/CT (CLIP) ×3 IMPLANT
CONT SPEC 4OZ CLIKSEAL STRL BL (MISCELLANEOUS) IMPLANT
COVER PROBE W GEL 5X96 (DRAPES) ×3 IMPLANT
COVER SURGICAL LIGHT HANDLE (MISCELLANEOUS) ×3 IMPLANT
DERMABOND ADVANCED (GAUZE/BANDAGES/DRESSINGS) ×2
DERMABOND ADVANCED .7 DNX12 (GAUZE/BANDAGES/DRESSINGS) ×2 IMPLANT
DEVICE DUBIN SPECIMEN MAMMOGRA (MISCELLANEOUS) ×3 IMPLANT
DRAPE CHEST BREAST 15X10 FENES (DRAPES) ×3 IMPLANT
DRAPE UNIVERSAL PACK (DRAPES) ×3 IMPLANT
DRAPE UTILITY XL STRL (DRAPES) ×6 IMPLANT
DRSG PAD ABDOMINAL 8X10 ST (GAUZE/BANDAGES/DRESSINGS) ×3 IMPLANT
ELECT BLADE 4.0 EZ CLEAN MEGAD (MISCELLANEOUS) ×3
ELECT CAUTERY BLADE 6.4 (BLADE) ×3 IMPLANT
ELECT REM PT RETURN 9FT ADLT (ELECTROSURGICAL) ×3
ELECTRODE BLDE 4.0 EZ CLN MEGD (MISCELLANEOUS) IMPLANT
ELECTRODE REM PT RTRN 9FT ADLT (ELECTROSURGICAL) ×2 IMPLANT
GLOVE BIO SURGEON STRL SZ 6 (GLOVE) ×3 IMPLANT
GLOVE BIOGEL PI IND STRL 6.5 (GLOVE) ×2 IMPLANT
GLOVE BIOGEL PI INDICATOR 6.5 (GLOVE) ×1
GOWN STRL REUS W/ TWL LRG LVL3 (GOWN DISPOSABLE) ×2 IMPLANT
GOWN STRL REUS W/TWL 2XL LVL3 (GOWN DISPOSABLE) ×3 IMPLANT
GOWN STRL REUS W/TWL LRG LVL3 (GOWN DISPOSABLE) ×3
KIT BASIN OR (CUSTOM PROCEDURE TRAY) ×3 IMPLANT
KIT MARKER MARGIN INK (KITS) ×3 IMPLANT
LIGHT WAVEGUIDE WIDE FLAT (MISCELLANEOUS) IMPLANT
NDL FILTER BLUNT 18X1 1/2 (NEEDLE) IMPLANT
NDL HYPO 25GX1X1/2 BEV (NEEDLE) ×2 IMPLANT
NDL SAFETY ECLIPSE 18X1.5 (NEEDLE) IMPLANT
NEEDLE FILTER BLUNT 18X 1/2SAF (NEEDLE)
NEEDLE FILTER BLUNT 18X1 1/2 (NEEDLE) IMPLANT
NEEDLE HYPO 18GX1.5 SHARP (NEEDLE)
NEEDLE HYPO 25GX1X1/2 BEV (NEEDLE) ×3 IMPLANT
NS IRRIG 1000ML POUR BTL (IV SOLUTION) ×3 IMPLANT
PACK SURGICAL SETUP 50X90 (CUSTOM PROCEDURE TRAY) ×3 IMPLANT
PENCIL BUTTON HOLSTER BLD 10FT (ELECTRODE) ×3 IMPLANT
SPONGE GAUZE 4X4 12PLY STER LF (GAUZE/BANDAGES/DRESSINGS) ×3 IMPLANT
SPONGE LAP 18X18 X RAY DECT (DISPOSABLE) ×5 IMPLANT
STOCKINETTE IMPERVIOUS 9X36 MD (GAUZE/BANDAGES/DRESSINGS) ×3 IMPLANT
STRIP CLOSURE SKIN 1/2X4 (GAUZE/BANDAGES/DRESSINGS) ×3 IMPLANT
STRIP CLOSURE SKIN 1/4X4 (GAUZE/BANDAGES/DRESSINGS) ×1 IMPLANT
SUT MNCRL AB 4-0 PS2 18 (SUTURE) ×5 IMPLANT
SUT SILK 2 0 SH (SUTURE) IMPLANT
SUT VIC AB 2-0 SH 27 (SUTURE) ×9
SUT VIC AB 2-0 SH 27X BRD (SUTURE) IMPLANT
SUT VIC AB 2-0 SH 27XBRD (SUTURE) ×2 IMPLANT
SUT VIC AB 3-0 SH 27 (SUTURE) ×3
SUT VIC AB 3-0 SH 27X BRD (SUTURE) ×2 IMPLANT
SUT VIC AB 3-0 SH 8-18 (SUTURE) ×3 IMPLANT
SYR BULB 3OZ (MISCELLANEOUS) ×3 IMPLANT
SYR CONTROL 10ML LL (SYRINGE) ×3 IMPLANT
TOWEL OR 17X24 6PK STRL BLUE (TOWEL DISPOSABLE) ×3 IMPLANT
TOWEL OR 17X26 10 PK STRL BLUE (TOWEL DISPOSABLE) ×3 IMPLANT
TUBE CONNECTING 12X1/4 (SUCTIONS) ×3 IMPLANT
YANKAUER SUCT BULB TIP NO VENT (SUCTIONS) ×3 IMPLANT

## 2017-08-16 NOTE — Transfer of Care (Signed)
Immediate Anesthesia Transfer of Care Note  Patient: Marie Castro  Procedure(s) Performed: RIGHT BREAST LUMPECTOMY WITH RADIOACTIVE SEED AND RIGHT SENTINEL LYMPH NODE BIOPSY (Right Breast) LEFT BREAST PARTIAL MASTECTOMY WITH BRACKETED RADIOACTIVE SEED LOCALIZATION (Left Breast)  Patient Location: PACU  Anesthesia Type:GA combined with regional for post-op pain  Level of Consciousness: awake and alert   Airway & Oxygen Therapy: Patient Spontanous Breathing and Patient connected to nasal cannula oxygen  Post-op Assessment: Report given to RN and Post -op Vital signs reviewed and stable  Post vital signs: Reviewed and stable  Last Vitals:  Vitals:   08/16/17 1210 08/16/17 1532  BP:  110/80  Pulse: 75 96  Resp: 17 13  Temp:  36.6 C  SpO2: 100% 100%    Last Pain:  Vitals:   08/16/17 1532  TempSrc:   PainSc: (P) 6       Patients Stated Pain Goal: 2 (28/00/34 9179)  Complications: No apparent anesthesia complications

## 2017-08-16 NOTE — Anesthesia Preprocedure Evaluation (Signed)
Anesthesia Evaluation  Patient identified by MRN, date of birth, ID band Patient awake    Reviewed: Allergy & Precautions, NPO status , Patient's Chart, lab work & pertinent test results  Airway Mallampati: II   Neck ROM: Full    Dental  (+) Teeth Intact, Dental Advisory Given   Pulmonary    breath sounds clear to auscultation       Cardiovascular  Rhythm:Regular Rate:Normal     Neuro/Psych    GI/Hepatic   Endo/Other    Renal/GU      Musculoskeletal   Abdominal   Peds  Hematology   Anesthesia Other Findings   Reproductive/Obstetrics                             Anesthesia Physical Anesthesia Plan  ASA: II  Anesthesia Plan: General   Post-op Pain Management:    Induction: Intravenous  PONV Risk Score and Plan: Ondansetron and Dexamethasone  Airway Management Planned: LMA  Additional Equipment:   Intra-op Plan:   Post-operative Plan: Extubation in OR  Informed Consent: I have reviewed the patients History and Physical, chart, labs and discussed the procedure including the risks, benefits and alternatives for the proposed anesthesia with the patient or authorized representative who has indicated his/her understanding and acceptance.   Dental advisory given  Plan Discussed with: CRNA and Anesthesiologist  Anesthesia Plan Comments:         Anesthesia Quick Evaluation

## 2017-08-16 NOTE — Interval H&P Note (Signed)
History and Physical Interval Note:  08/16/2017 12:04 PM  Marie Castro  has presented today for surgery, with the diagnosis of BILATERAL BREAST CANCER  The various methods of treatment have been discussed with the patient and family. After consideration of risks, benefits and other options for treatment, the patient has consented to  Procedure(s): RIGHT BREAST LUMPECTOMY WITH RADIOACTIVE SEED AND RIGHT SENTINEL LYMPH NODE BIOPSY (Right) LEFT BREAST PARTIAL MASTECTOMY WITH BRACKETED RADIOACTIVE SEED LOCALIZATION (Left) as a surgical intervention .  The patient's history has been reviewed, patient examined, no change in status, stable for surgery.  I have reviewed the patient's chart and labs.  Questions were answered to the patient's satisfaction.     Shontavia Mickel

## 2017-08-16 NOTE — Anesthesia Postprocedure Evaluation (Signed)
Anesthesia Post Note  Patient: Marie Castro  Procedure(s) Performed: RIGHT BREAST LUMPECTOMY WITH RADIOACTIVE SEED AND RIGHT SENTINEL LYMPH NODE BIOPSY (Right Breast) LEFT BREAST PARTIAL MASTECTOMY WITH BRACKETED RADIOACTIVE SEED LOCALIZATION (Left Breast)     Patient location during evaluation: PACU Anesthesia Type: General Level of consciousness: awake, awake and alert and oriented Pain management: pain level controlled Vital Signs Assessment: post-procedure vital signs reviewed and stable Respiratory status: spontaneous breathing, nonlabored ventilation and respiratory function stable Cardiovascular status: blood pressure returned to baseline Postop Assessment: no headache Anesthetic complications: no    Last Vitals:  Vitals:   08/16/17 1615 08/16/17 1630  BP: 135/78 121/73  Pulse: 84 78  Resp: 13 17  Temp:  36.5 C  SpO2: 99% 97%    Last Pain:  Vitals:   08/16/17 1630  TempSrc:   PainSc: 3                  Barrett Holthaus COKER

## 2017-08-16 NOTE — Anesthesia Procedure Notes (Signed)
Procedure Name: LMA Insertion Date/Time: 08/16/2017 1:04 PM Performed by: Roberts Gaudy, MD Pre-anesthesia Checklist: Patient identified, Emergency Drugs available, Suction available, Patient being monitored and Timeout performed Patient Re-evaluated:Patient Re-evaluated prior to induction Oxygen Delivery Method: Circle system utilized Preoxygenation: Pre-oxygenation with 100% oxygen Induction Type: IV induction Ventilation: Mask ventilation without difficulty LMA: LMA inserted LMA Size: 4.0 Number of attempts: 2 Placement Confirmation: positive ETCO2 and breath sounds checked- equal and bilateral Tube secured with: Tape Dental Injury: Teeth and Oropharynx as per pre-operative assessment

## 2017-08-16 NOTE — Anesthesia Procedure Notes (Addendum)
Anesthesia Regional Block: Pectoralis block   Pre-Anesthetic Checklist: ,, timeout performed, Correct Patient, Correct Site, Correct Laterality, Correct Procedure, Correct Position, site marked, Risks and benefits discussed,  Surgical consent,  Pre-op evaluation,  At surgeon's request and post-op pain management  Laterality: Left  Prep: chloraprep       Needles:  Injection technique: Single-shot  Needle Type: Echogenic Stimulator Needle     Needle Length: 9cm  Needle Gauge: 21     Additional Needles:   Procedures:,,,, ultrasound used (permanent image in chart),,,,  Narrative:  Start time: 08/16/2017 12:20 PM End time: 08/16/2017 12:25 PM Injection made incrementally with aspirations every 5 mL.  Performed by: Personally  Anesthesiologist: Roberts Gaudy, MD  Additional Notes: 20 cc 0.5% Ropivacaine injected easily

## 2017-08-16 NOTE — Anesthesia Procedure Notes (Signed)
Anesthesia Regional Block: Pectoralis block   Pre-Anesthetic Checklist: ,, timeout performed, Correct Patient, Correct Site, Correct Laterality, Correct Procedure, Correct Position, site marked, Risks and benefits discussed,  Surgical consent,  Pre-op evaluation,  At surgeon's request and post-op pain management  Laterality: Right  Prep: chloraprep       Needles:  Injection technique: Single-shot  Needle Type: Echogenic Stimulator Needle     Needle Length: 9cm  Needle Gauge: 21     Additional Needles:   Procedures:,,,, ultrasound used (permanent image in chart),,,,  Narrative:  Start time: 08/16/2017 12:30 PM End time: 08/16/2017 12:35 PM Injection made incrementally with aspirations every 5 mL.  Performed by: Personally  Anesthesiologist: Roberts Gaudy, MD  Additional Notes: 20 cc 0.5% Ropivacaine injected easily

## 2017-08-16 NOTE — Discharge Instructions (Addendum)
[facility] °Office Phone Number [phone] ° °BREAST BIOPSY/ PARTIAL MASTECTOMY: POST OP INSTRUCTIONS ° °Always review your discharge instruction sheet given to you by the facility where your surgery was performed. ° °IF YOU HAVE DISABILITY OR FAMILY LEAVE FORMS, YOU MUST BRING THEM TO THE OFFICE FOR PROCESSING.  DO NOT GIVE THEM TO YOUR DOCTOR. ° °1. A prescription for pain medication may be given to you upon discharge.  Take your pain medication as prescribed, if needed.  If narcotic pain medicine is not needed, then you may take acetaminophen (Tylenol) or ibuprofen (Advil) as needed. °2. Take your usually prescribed medications unless otherwise directed °3. If you need a refill on your pain medication, please contact your pharmacy.  They will contact our office to request authorization.  Prescriptions will not be filled after 5pm or on week-ends. °4. You should eat very light the first 24 hours after surgery, such as soup, crackers, pudding, etc.  Resume your normal diet the day after surgery. °5. Most patients will experience some swelling and bruising in the breast.  Ice packs and a good support bra will help.  Swelling and bruising can take several days to resolve.  °6. It is common to experience some constipation if taking pain medication after surgery.  Increasing fluid intake and taking a stool softener will usually help or prevent this problem from occurring.  A mild laxative (Milk of Magnesia or Miralax) should be taken according to package directions if there are no bowel movements after 48 hours. °7. Unless discharge instructions indicate otherwise, you may remove your bandages 48 hours after surgery, and you may shower at that time.  You may have steri-strips (small skin tapes) in place directly over the incision.  These strips should be left on the skin for 7-10 days.   Any sutures or staples will be removed at the office during your follow-up visit. °8. ACTIVITIES:  You may resume  regular daily activities (gradually increasing) beginning the next day.  Wearing a good support bra or sports bra (or the breast binder) minimizes pain and swelling.  You may have sexual intercourse when it is comfortable. °a. You may drive when you no longer are taking prescription pain medication, you can comfortably wear a seatbelt, and you can safely maneuver your car and apply brakes. °b. RETURN TO WORK:  __________1 week_______________ °9. You should see your doctor in the office for a follow-up appointment approximately two weeks after your surgery.  Your doctor’s nurse will typically make your follow-up appointment when she calls you with your pathology report.  Expect your pathology report 2-3 business days after your surgery.  You may call to check if you do not hear from us after three days. ° ° °WHEN TO CALL YOUR DOCTOR: °1. Fever over 101.0 °2. Nausea and/or vomiting. °3. Extreme swelling or bruising. °4. Continued bleeding from incision. °5. Increased pain, redness, or drainage from the incision. ° °The clinic staff is available to answer your questions during regular business hours.  Please don’t hesitate to call and ask to speak to one of the nurses for clinical concerns.  If you have a medical emergency, go to the nearest emergency room or call 911.  A surgeon from [facility] is always on call at the hospital. ° °For further questions, please visit [email or web address]  ° °

## 2017-08-16 NOTE — Anesthesia Procedure Notes (Deleted)
Anesthesia Procedure Note     

## 2017-08-16 NOTE — Op Note (Signed)
Right Breast Radioactive seed localized lumpectomy and sentinel lymph node biopsy, left bracketed seed partial mastectomy   Indications: This patient presents with history of right breast cancer, cT1cN0 invasive lobular, grade 1-2, +/+/-, left breast LCIS and ALH  Pre-operative Diagnosis: see above  Post-operative Diagnosis: Same  Surgeon: [name]   Anesthesia: General endotracheal anesthesia  ASA Class: 2  Procedure Details  The patient was seen in the Holding Room. The risks, benefits, complications, treatment options, and expected outcomes were discussed with the patient. The possibilities of bleeding, infection, the need for additional procedures, failure to diagnose a condition, and creating a complication requiring transfusion or operation were discussed with the patient. The patient concurred with the proposed plan, giving informed consent.  The site of surgery properly noted/marked. The patient was taken to Operating Room # 2, identified, and the procedure verified as Right Breast seed localized Lumpectomy with SLN bx, left bracketed partial mastectomy. A Time Out was held and the above information confirmed.  The bilateral breast and chest were prepped and draped in standard fashion. The right lumpectomy was performed by creating a circumareolar incision over the lateral breast just medial to the previously placed radioactive seed.  Dissection was carried down to around the point of maximum signal intensity. The cautery was used to perform the dissection.  Hemostasis was achieved with cautery. The edges of the cavity were marked with large clips.  The specimen was inked with the margin marker paint kit.    Specimen radiography confirmed inclusion of the mammographic lesion, the clip, and the seed.  The background signal in the breast was zero.  The wound was irrigated and closed with 3-0 vicryl in layers and 4-0 monocryl subcuticular suture.    The left breast was addressed next.   The seeds were identified transcutaneously first and the mammogram examined.  Since there were 3 seeds, a circumlinear incision was made between the clips.  The cautery was used to dissect around all the seeds.  The most lateral seed came out alone, but the clip was medial, so the seed was placed into a cup to maintain its location.  The partial mastectomy was taken incorporating all three clips and the other two seeds.  Specimen radiography confirmed this.  The cavity was irrigated and hemostasis achieved with cautery.  Clips were placed.  Some of the breast tissue was mobilized underneath to create less of a defect.  This was secured with 2-0 interrupted vicryl.  The skin was reapproximated with interrupted 3-0 vicryl deep dermal sutures and 4-0 monocryl running subcuticular suture.    Using a hand-held gamma probe, Right axillary sentinel nodes were identified transcutaneously.  An oblique incision was created below the axillary hairline.  Dissection was carried through the clavipectoral fascia.  Three deep level 2 axillary sentinel nodes were removed.  Counts per second were 1220, 870, and 130.    The background count was 5 cps.  The wound was irrigated.  Hemostasis was achieved with cautery.  The axillary incision was closed with a 3-0 vicryl deep dermal interrupted sutures and a 4-0 monocryl subcuticular closure.    Sterile dressings were applied. At the end of the operation, all sponge, instrument, and needle counts were correct.  Findings: grossly clear surgical margins and no adenopathy.  Posterior margin is pectoralis on both sides.  On left side, anterior margin is skin.  Estimated Blood Loss:  min         Specimens: right breast lumpectomy with seed, left breast  partial mastectomy with seeds,  and three right axillary sentinel lymph nodes.             Complications:  None; patient tolerated the procedure well.         Disposition: PACU - hemodynamically stable.         Condition:  stable

## 2017-08-17 ENCOUNTER — Encounter (HOSPITAL_COMMUNITY): Payer: Self-pay | Admitting: General Surgery

## 2017-08-23 ENCOUNTER — Telehealth: Payer: Self-pay | Admitting: General Surgery

## 2017-08-23 ENCOUNTER — Other Ambulatory Visit: Payer: Self-pay | Admitting: General Surgery

## 2017-08-23 ENCOUNTER — Other Ambulatory Visit: Payer: Self-pay | Admitting: Oncology

## 2017-08-23 ENCOUNTER — Telehealth: Payer: Self-pay | Admitting: *Deleted

## 2017-08-23 NOTE — Telephone Encounter (Signed)
Received order for oncotype testing. Requisition sent to pathology. Received by Varney Biles. PAC fax to [facility] and [facility]

## 2017-08-23 NOTE — Telephone Encounter (Signed)
Discussed pathology with patient including need for left sided reexcision.  Wrote orders and sent posting sheet.

## 2017-08-24 ENCOUNTER — Encounter (HOSPITAL_BASED_OUTPATIENT_CLINIC_OR_DEPARTMENT_OTHER): Payer: Self-pay | Admitting: *Deleted

## 2017-08-24 ENCOUNTER — Other Ambulatory Visit: Payer: Self-pay

## 2017-08-24 NOTE — Progress Notes (Signed)
PT in to pick up ERAS drink, instructions reviewed.

## 2017-08-27 ENCOUNTER — Ambulatory Visit (HOSPITAL_BASED_OUTPATIENT_CLINIC_OR_DEPARTMENT_OTHER)
Admission: RE | Admit: 2017-08-27 | Discharge: 2017-08-27 | Disposition: A | Discharge status: Home / Self Care | Payer: BC Managed Care – PPO | Source: Ambulatory Visit | Attending: General Surgery | Admitting: General Surgery

## 2017-08-27 ENCOUNTER — Encounter (HOSPITAL_BASED_OUTPATIENT_CLINIC_OR_DEPARTMENT_OTHER): Payer: Self-pay | Admitting: Anesthesiology

## 2017-08-27 ENCOUNTER — Ambulatory Visit (HOSPITAL_BASED_OUTPATIENT_CLINIC_OR_DEPARTMENT_OTHER): Payer: BC Managed Care – PPO | Admitting: Anesthesiology

## 2017-08-27 ENCOUNTER — Encounter (HOSPITAL_BASED_OUTPATIENT_CLINIC_OR_DEPARTMENT_OTHER)
Admission: RE | Disposition: A | Discharge status: Home / Self Care | Payer: Self-pay | Source: Ambulatory Visit | Attending: General Surgery

## 2017-08-27 ENCOUNTER — Other Ambulatory Visit: Payer: Self-pay

## 2017-08-27 DIAGNOSIS — E039 Hypothyroidism, unspecified: Secondary | ICD-10-CM | POA: Diagnosis not present

## 2017-08-27 DIAGNOSIS — Z79899 Other long term (current) drug therapy: Secondary | ICD-10-CM | POA: Insufficient documentation

## 2017-08-27 DIAGNOSIS — C50412 Malignant neoplasm of upper-outer quadrant of left female breast: Secondary | ICD-10-CM | POA: Insufficient documentation

## 2017-08-27 DIAGNOSIS — Z170 Estrogen receptor positive status [ER+]: Secondary | ICD-10-CM | POA: Insufficient documentation

## 2017-08-27 HISTORY — PX: RE-EXCISION OF BREAST LUMPECTOMY: SHX6048

## 2017-08-27 SURGERY — EXCISION, LESION, BREAST
Anesthesia: General | Laterality: Left

## 2017-08-27 MED ORDER — DEXAMETHASONE SODIUM PHOSPHATE 4 MG/ML IJ SOLN
INTRAMUSCULAR | Status: DC | PRN
  Administered 2017-08-27: 10 mg via INTRAVENOUS
  Administered 2017-08-27: 4 mg via INTRAVENOUS

## 2017-08-27 MED ORDER — SODIUM CHLORIDE 0.9% FLUSH: 3.0000 mL | INTRAVENOUS | Status: DC | PRN

## 2017-08-27 MED ORDER — CHLORHEXIDINE GLUCONATE CLOTH 2 % EX PADS: 6.0000 | MEDICATED_PAD | Freq: Once | CUTANEOUS | Status: DC

## 2017-08-27 MED ORDER — METOCLOPRAMIDE HCL 5 MG/ML IJ SOLN: 10.0000 mg | Freq: Once | INTRAMUSCULAR | Status: DC | PRN

## 2017-08-27 MED ORDER — ACETAMINOPHEN 650 MG RE SUPP
650.0000 mg | RECTAL | Status: DC | PRN
Start: 2017-08-27 — End: 2017-08-27

## 2017-08-27 MED ORDER — SCOPOLAMINE 1 MG/3DAYS TD PT72: 1.0000 | MEDICATED_PATCH | Freq: Once | TRANSDERMAL | Status: DC | PRN

## 2017-08-27 MED ORDER — SODIUM CHLORIDE 0.9 % IV SOLN: 250.0000 mL | INTRAVENOUS | Status: DC | PRN

## 2017-08-27 MED ORDER — MIDAZOLAM HCL 2 MG/2ML IJ SOLN
INTRAMUSCULAR | Status: AC
  Filled 2017-08-27: qty 2

## 2017-08-27 MED ORDER — MIDAZOLAM HCL 2 MG/2ML IJ SOLN
1.0000 mg | INTRAMUSCULAR | Status: DC | PRN
  Administered 2017-08-27: 2 mg via INTRAVENOUS

## 2017-08-27 MED ORDER — LIDOCAINE 2% (20 MG/ML) 5 ML SYRINGE
INTRAMUSCULAR | Status: AC
  Filled 2017-08-27: qty 5

## 2017-08-27 MED ORDER — PROPOFOL 10 MG/ML IV BOLUS
INTRAVENOUS | Status: AC
  Filled 2017-08-27: qty 20

## 2017-08-27 MED ORDER — KETOROLAC TROMETHAMINE 30 MG/ML IJ SOLN: 30.0000 mg | Freq: Once | INTRAMUSCULAR | Status: DC

## 2017-08-27 MED ORDER — FENTANYL CITRATE (PF) 100 MCG/2ML IJ SOLN
INTRAMUSCULAR | Status: AC
  Filled 2017-08-27: qty 2

## 2017-08-27 MED ORDER — CELECOXIB 200 MG PO CAPS
200.0000 mg | ORAL_CAPSULE | ORAL | Status: AC
  Administered 2017-08-27: 200 mg via ORAL

## 2017-08-27 MED ORDER — ACETAMINOPHEN 325 MG PO TABS: 650.0000 mg | ORAL_TABLET | ORAL | Status: DC | PRN

## 2017-08-27 MED ORDER — CELECOXIB 200 MG PO CAPS
ORAL_CAPSULE | ORAL | Status: AC
  Filled 2017-08-27: qty 1

## 2017-08-27 MED ORDER — LACTATED RINGERS IV SOLN
INTRAVENOUS | Status: DC
  Administered 2017-08-27: 09:00:00 via INTRAVENOUS

## 2017-08-27 MED ORDER — ACETAMINOPHEN 500 MG PO TABS
1000.0000 mg | ORAL_TABLET | ORAL | Status: AC
  Administered 2017-08-27: 1000 mg via ORAL

## 2017-08-27 MED ORDER — GABAPENTIN 300 MG PO CAPS
300.0000 mg | ORAL_CAPSULE | ORAL | Status: AC
  Administered 2017-08-27: 300 mg via ORAL

## 2017-08-27 MED ORDER — FENTANYL CITRATE (PF) 100 MCG/2ML IJ SOLN
50.0000 ug | INTRAMUSCULAR | Status: DC | PRN
  Administered 2017-08-27: 100 ug via INTRAVENOUS

## 2017-08-27 MED ORDER — LIDOCAINE-EPINEPHRINE (PF) 1 %-1:200000 IJ SOLN
INTRAMUSCULAR | Status: AC
  Filled 2017-08-27: qty 30

## 2017-08-27 MED ORDER — DEXAMETHASONE SODIUM PHOSPHATE 10 MG/ML IJ SOLN
INTRAMUSCULAR | Status: AC
  Filled 2017-08-27: qty 1

## 2017-08-27 MED ORDER — CEFAZOLIN SODIUM-DEXTROSE 2-4 GM/100ML-% IV SOLN
2.0000 g | INTRAVENOUS | Status: AC
  Administered 2017-08-27: 2 g via INTRAVENOUS

## 2017-08-27 MED ORDER — BUPIVACAINE HCL 0.25 % IJ SOLN
INTRAMUSCULAR | Status: DC | PRN
  Administered 2017-08-27: 10 mL via INTRAMUSCULAR

## 2017-08-27 MED ORDER — GABAPENTIN 300 MG PO CAPS
ORAL_CAPSULE | ORAL | Status: AC
  Filled 2017-08-27: qty 1

## 2017-08-27 MED ORDER — SODIUM CHLORIDE 0.9% FLUSH: 3.0000 mL | Freq: Two times a day (BID) | INTRAVENOUS | Status: DC

## 2017-08-27 MED ORDER — ONDANSETRON HCL 4 MG/2ML IJ SOLN
INTRAMUSCULAR | Status: AC
  Filled 2017-08-27: qty 2

## 2017-08-27 MED ORDER — ACETAMINOPHEN 500 MG PO TABS
ORAL_TABLET | ORAL | Status: AC
  Filled 2017-08-27: qty 2

## 2017-08-27 MED ORDER — LIDOCAINE HCL (CARDIAC) 20 MG/ML IV SOLN
INTRAVENOUS | Status: DC | PRN
  Administered 2017-08-27: 60 mg via INTRAVENOUS

## 2017-08-27 MED ORDER — MEPERIDINE HCL 25 MG/ML IJ SOLN: 6.2500 mg | INTRAMUSCULAR | Status: DC | PRN

## 2017-08-27 MED ORDER — CEFAZOLIN SODIUM-DEXTROSE 2-4 GM/100ML-% IV SOLN
INTRAVENOUS | Status: AC
  Filled 2017-08-27: qty 100

## 2017-08-27 MED ORDER — FENTANYL CITRATE (PF) 100 MCG/2ML IJ SOLN
25.0000 ug | INTRAMUSCULAR | Status: DC | PRN
  Administered 2017-08-27: 50 ug via INTRAVENOUS
  Administered 2017-08-27 (×2): 25 ug via INTRAVENOUS

## 2017-08-27 MED ORDER — BUPIVACAINE HCL (PF) 0.25 % IJ SOLN
INTRAMUSCULAR | Status: AC
  Filled 2017-08-27: qty 30

## 2017-08-27 MED ORDER — PROPOFOL 10 MG/ML IV BOLUS
INTRAVENOUS | Status: DC | PRN
  Administered 2017-08-27: 150 mg via INTRAVENOUS

## 2017-08-27 SURGICAL SUPPLY — 54 items
ADH SKN CLS APL DERMABOND .7 (GAUZE/BANDAGES/DRESSINGS) ×1
BINDER BREAST 3XL (GAUZE/BANDAGES/DRESSINGS) IMPLANT
BINDER BREAST LRG (GAUZE/BANDAGES/DRESSINGS) IMPLANT
BINDER BREAST MEDIUM (GAUZE/BANDAGES/DRESSINGS) IMPLANT
BINDER BREAST XLRG (GAUZE/BANDAGES/DRESSINGS) IMPLANT
BINDER BREAST XXLRG (GAUZE/BANDAGES/DRESSINGS) IMPLANT
BLADE SURG 10 STRL SS (BLADE) ×2 IMPLANT
BLADE SURG 15 STRL LF DISP TIS (BLADE) IMPLANT
BLADE SURG 15 STRL SS (BLADE)
CANISTER SUCT 1200ML W/VALVE (MISCELLANEOUS) ×2 IMPLANT
CHLORAPREP W/TINT 26ML (MISCELLANEOUS) ×2 IMPLANT
CLIP VESOCCLUDE LG 6/CT (CLIP) ×2 IMPLANT
COVER BACK TABLE 60X90IN (DRAPES) ×2 IMPLANT
COVER MAYO STAND STRL (DRAPES) ×2 IMPLANT
DECANTER SPIKE VIAL GLASS SM (MISCELLANEOUS) IMPLANT
DERMABOND ADVANCED (GAUZE/BANDAGES/DRESSINGS) ×1
DERMABOND ADVANCED .7 DNX12 (GAUZE/BANDAGES/DRESSINGS) ×1 IMPLANT
DRAPE LAPAROSCOPIC ABDOMINAL (DRAPES) ×2 IMPLANT
DRAPE UTILITY XL STRL (DRAPES) ×2 IMPLANT
ELECT COATED BLADE 2.86 ST (ELECTRODE) ×2 IMPLANT
ELECT REM PT RETURN 9FT ADLT (ELECTROSURGICAL) ×2
ELECTRODE REM PT RTRN 9FT ADLT (ELECTROSURGICAL) ×1 IMPLANT
GAUZE SPONGE 4X4 12PLY STRL (GAUZE/BANDAGES/DRESSINGS) IMPLANT
GAUZE SPONGE 4X4 12PLY STRL LF (GAUZE/BANDAGES/DRESSINGS) ×2 IMPLANT
GLOVE BIO SURGEON STRL SZ 6 (GLOVE) ×2 IMPLANT
GLOVE BIOGEL PI IND STRL 6.5 (GLOVE) ×1 IMPLANT
GLOVE BIOGEL PI INDICATOR 6.5 (GLOVE) ×1
GOWN STRL REUS W/ TWL LRG LVL3 (GOWN DISPOSABLE) ×1 IMPLANT
GOWN STRL REUS W/TWL 2XL LVL3 (GOWN DISPOSABLE) ×2 IMPLANT
GOWN STRL REUS W/TWL LRG LVL3 (GOWN DISPOSABLE) ×2
KIT MARKER MARGIN INK (KITS) ×2 IMPLANT
NDL HYPO 25X1 1.5 SAFETY (NEEDLE) ×1 IMPLANT
NEEDLE HYPO 25X1 1.5 SAFETY (NEEDLE) ×2 IMPLANT
NS IRRIG 1000ML POUR BTL (IV SOLUTION) ×2 IMPLANT
PACK BASIN DAY SURGERY FS (CUSTOM PROCEDURE TRAY) ×2 IMPLANT
PENCIL BUTTON HOLSTER BLD 10FT (ELECTRODE) ×2 IMPLANT
SLEEVE SCD COMPRESS KNEE MED (MISCELLANEOUS) ×2 IMPLANT
SPONGE LAP 18X18 X RAY DECT (DISPOSABLE) ×2 IMPLANT
STAPLER VISISTAT 35W (STAPLE) IMPLANT
STRIP CLOSURE SKIN 1/2X4 (GAUZE/BANDAGES/DRESSINGS) ×2 IMPLANT
SUT MON AB 4-0 PC3 18 (SUTURE) ×2 IMPLANT
SUT SILK 2 0 SH (SUTURE) IMPLANT
SUT VIC AB 2-0 SH 27 (SUTURE) ×2
SUT VIC AB 2-0 SH 27XBRD (SUTURE) IMPLANT
SUT VIC AB 3-0 54X BRD REEL (SUTURE) IMPLANT
SUT VIC AB 3-0 BRD 54 (SUTURE)
SUT VIC AB 3-0 SH 27 (SUTURE) ×2
SUT VIC AB 3-0 SH 27X BRD (SUTURE) ×1 IMPLANT
SYR BULB 3OZ (MISCELLANEOUS) ×2 IMPLANT
SYR CONTROL 10ML LL (SYRINGE) ×2 IMPLANT
TOWEL OR 17X24 6PK STRL BLUE (TOWEL DISPOSABLE) ×2 IMPLANT
TOWEL OR NON WOVEN STRL DISP B (DISPOSABLE) ×2 IMPLANT
TUBE CONNECTING 20X1/4 (TUBING) ×2 IMPLANT
YANKAUER SUCT BULB TIP NO VENT (SUCTIONS) ×2 IMPLANT

## 2017-08-27 NOTE — Discharge Instructions (Addendum)
[facility] °Office Phone Number [phone] ° °BREAST BIOPSY/ PARTIAL MASTECTOMY: POST OP INSTRUCTIONS ° °Always review your discharge instruction sheet given to you by the facility where your surgery was performed. ° °IF YOU HAVE DISABILITY OR FAMILY LEAVE FORMS, YOU MUST BRING THEM TO THE OFFICE FOR PROCESSING.  DO NOT GIVE THEM TO YOUR DOCTOR. ° °1. A prescription for pain medication may be given to you upon discharge.  Take your pain medication as prescribed, if needed.  If narcotic pain medicine is not needed, then you may take acetaminophen (Tylenol) or ibuprofen (Advil) as needed. °2. Take your usually prescribed medications unless otherwise directed °3. If you need a refill on your pain medication, please contact your pharmacy.  They will contact our office to request authorization.  Prescriptions will not be filled after 5pm or on week-ends. °4. You should eat very light the first 24 hours after surgery, such as soup, crackers, pudding, etc.  Resume your normal diet the day after surgery. °5. Most patients will experience some swelling and bruising in the breast.  Ice packs and a good support bra will help.  Swelling and bruising can take several days to resolve.  °6. It is common to experience some constipation if taking pain medication after surgery.  Increasing fluid intake and taking a stool softener will usually help or prevent this problem from occurring.  A mild laxative (Milk of Magnesia or Miralax) should be taken according to package directions if there are no bowel movements after 48 hours. °7. Unless discharge instructions indicate otherwise, you may remove your bandages 48 hours after surgery, and you may shower at that time.  You may have steri-strips (small skin tapes) in place directly over the incision.  These strips should be left on the skin for 7-10 days.   Any sutures or staples will be removed at the office during your follow-up visit. °8. ACTIVITIES:  You may resume  regular daily activities (gradually increasing) beginning the next day.  Wearing a good support bra or sports bra (or the breast binder) minimizes pain and swelling.  You may have sexual intercourse when it is comfortable. °a. You may drive when you no longer are taking prescription pain medication, you can comfortably wear a seatbelt, and you can safely maneuver your car and apply brakes. °b. RETURN TO WORK:  __________1 week_______________ °9. You should see your doctor in the office for a follow-up appointment approximately two weeks after your surgery.  Your doctor’s nurse will typically make your follow-up appointment when she calls you with your pathology report.  Expect your pathology report 2-3 business days after your surgery.  You may call to check if you do not hear from us after three days. ° ° °WHEN TO CALL YOUR DOCTOR: °1. Fever over 101.0 °2. Nausea and/or vomiting. °3. Extreme swelling or bruising. °4. Continued bleeding from incision. °5. Increased pain, redness, or drainage from the incision. ° °The clinic staff is available to answer your questions during regular business hours.  Please don’t hesitate to call and ask to speak to one of the nurses for clinical concerns.  If you have a medical emergency, go to the nearest emergency room or call 911.  A surgeon from [facility] is always on call at the hospital. ° °For further questions, please visit [email or web address]  ° ° °Post Anesthesia Home Care Instructions ° °Activity: °Get plenty of rest for the remainder of the day. A responsible individual must stay with you for 24   hours following the procedure.  °For the next 24 hours, DO NOT: °-Drive a car °-Operate machinery °-Drink alcoholic beverages °-Take any medication unless instructed by your physician °-Make any legal decisions or sign important papers. ° °Meals: °Start with liquid foods such as gelatin or soup. Progress to regular foods as tolerated. Avoid greasy, spicy,  heavy foods. If nausea and/or vomiting occur, drink only clear liquids until the nausea and/or vomiting subsides. Call your physician if vomiting continues. ° °Special Instructions/Symptoms: °Your throat may feel dry or sore from the anesthesia or the breathing tube placed in your throat during surgery. If this causes discomfort, gargle with warm salt water. The discomfort should disappear within 24 hours. ° °If you had a scopolamine patch placed behind your ear for the management of post- operative nausea and/or vomiting: ° °1. The medication in the patch is effective for 72 hours, after which it should be removed.  Wrap patch in a tissue and discard in the trash. Wash hands thoroughly with soap and water. °2. You may remove the patch earlier than 72 hours if you experience unpleasant side effects which may include dry mouth, dizziness or visual disturbances. °3. Avoid touching the patch. Wash your hands with soap and water after contact with the patch. °  ° °

## 2017-08-27 NOTE — Anesthesia Postprocedure Evaluation (Signed)
Anesthesia Post Note  Patient: ALYENE PREDMORE  Procedure(s) Performed: LEFT RE-EXCISION OF BREAST LUMPECTOMY ERAS PATHWAY (Left )     Patient location during evaluation: PACU Anesthesia Type: General Level of consciousness: awake and alert Pain management: pain level controlled Vital Signs Assessment: post-procedure vital signs reviewed and stable Respiratory status: spontaneous breathing, nonlabored ventilation and respiratory function stable Cardiovascular status: blood pressure returned to baseline and stable Postop Assessment: no apparent nausea or vomiting Anesthetic complications: no    Last Vitals:  Vitals:   08/27/17 1000 08/27/17 1015  BP: 114/68 114/74  Pulse: 93 89  Resp: 15 17  Temp: 36.9 C   SpO2: 100% 100%    Last Pain:  Vitals:   08/27/17 1015  TempSrc:   PainSc: 5                  Eliana Lueth A.

## 2017-08-27 NOTE — Op Note (Signed)
Re-excisional left Breast Lumpectomy   Indications: This patient presents with history of positive superior margin after partial mastectomy for left breast cancer   Pre-operative Diagnosis: left breast cancer, cTis, UOQ, +/+  Post-operative Diagnosis: Left breast cancer   Surgeon: [name]   Assistants: n/a   Anesthesia: General anesthesia and Local anesthesia, marcaine and lidocaine mixed  ASA Class: 2   Procedure Details  The patient was seen in the Holding Room. The risks, benefits, complications, treatment options, and expected outcomes were discussed with the patient. The possibilities of reaction to medication, pulmonary aspiration, bleeding, infection, the need for additional procedures, failure to diagnose a condition, and creating a complication requiring transfusion or operation were discussed with the patient. The patient concurred with the proposed plan, giving informed consent. The site of surgery properly noted/marked. The patient was taken to Operating Room # 5, identified, and the procedure verified as re-excision of left breast cancer.  After induction of anesthesia, the left breast and chest were prepped and draped in standard fashion.  The lumpectomy was performed by reopening the prior incision. Seroma was aspirated. The mastopexy sutures were removed. Additional margins were taken at the superior border of the partial mastectomy cavity. The specimen was marked with the margin marker paint kit.  A new superior clip was placed in the cavity.  Hemostasis was achieved with cautery. The wound was irrigated.  Several deep 2-0 vicryl sutures were placed to decrease the defect in the UOQ.  The skin was then closed with a 3-0 Vicryl deep dermal interrupted and a 4-0 Monocryl subcuticular closure in layers.  Sterile dressings were applied. At the end of the operation, all sponge, instrument, and needle counts were correct.   Findings:  grossly clear surgical margins, no  evidence of infection.  Skin is anterior margin.  Pectoralis is posterior margin.    Estimated Blood Loss: Minimal   Drains: none   Specimens: additional superior margin  Complications: None; patient tolerated the procedure well.   Disposition: PACU - hemodynamically stable.   Condition: stable

## 2017-08-27 NOTE — Anesthesia Preprocedure Evaluation (Signed)
Anesthesia Evaluation  Patient identified by MRN, date of birth, ID band Patient awake    Reviewed: Allergy & Precautions, NPO status , Patient's Chart, lab work & pertinent test results  Airway Mallampati: II   Neck ROM: Full    Dental  (+) Teeth Intact, Dental Advisory Given   Pulmonary neg pulmonary ROS,    Pulmonary exam normal breath sounds clear to auscultation       Cardiovascular negative cardio ROS Normal cardiovascular exam Rhythm:Regular Rate:Normal     Neuro/Psych negative neurological ROS  negative psych ROS   GI/Hepatic negative GI ROS, Neg liver ROS,   Endo/Other  Hypothyroidism Left Breast Ca  Renal/GU negative Renal ROS  negative genitourinary   Musculoskeletal negative musculoskeletal ROS (+)   Abdominal   Peds  Hematology negative hematology ROS (+)   Anesthesia Other Findings   Reproductive/Obstetrics                             Anesthesia Physical  Anesthesia Plan  ASA: II  Anesthesia Plan: General   Post-op Pain Management:    Induction: Intravenous  PONV Risk Score and Plan: Ondansetron, Dexamethasone and Midazolam  Airway Management Planned: LMA  Additional Equipment:   Intra-op Plan:   Post-operative Plan: Extubation in OR  Informed Consent: I have reviewed the patients History and Physical, chart, labs and discussed the procedure including the risks, benefits and alternatives for the proposed anesthesia with the patient or authorized representative who has indicated his/her understanding and acceptance.   Dental advisory given  Plan Discussed with: CRNA, Anesthesiologist and Surgeon  Anesthesia Plan Comments:         Anesthesia Quick Evaluation

## 2017-08-27 NOTE — Interval H&P Note (Signed)
History and Physical Interval Note:  08/27/2017 8:39 AM  Marie Castro  has presented today for surgery, with the diagnosis of left breast cancer with positive margins s/p lumpectomy.  The various methods of treatment have been discussed with the patient and family. After consideration of risks, benefits and other options for treatment, the patient has consented to  Procedure(s): LEFT RE-EXCISION OF BREAST LUMPECTOMY ERAS PATHWAY (Left) as a surgical intervention .  The patient's history has been reviewed, patient examined, no change in status, stable for surgery.  I have reviewed the patient's chart and labs.  Questions were answered to the patient's satisfaction.     [name]

## 2017-08-27 NOTE — Anesthesia Procedure Notes (Signed)
Procedure Name: LMA Insertion Date/Time: 08/27/2017 9:14 AM Performed by: Signe Colt, CRNA Pre-anesthesia Checklist: Patient identified, Emergency Drugs available, Suction available and Patient being monitored Patient Re-evaluated:Patient Re-evaluated prior to induction Oxygen Delivery Method: Circle system utilized Preoxygenation: Pre-oxygenation with 100% oxygen Induction Type: IV induction Ventilation: Mask ventilation without difficulty LMA: LMA inserted LMA Size: 3.0 Number of attempts: 1 Airway Equipment and Method: Bite block Placement Confirmation: positive ETCO2 Tube secured with: Tape Dental Injury: Teeth and Oropharynx as per pre-operative assessment

## 2017-08-27 NOTE — Transfer of Care (Signed)
Immediate Anesthesia Transfer of Care Note  Patient: SURIE SUCHOCKI  Procedure(s) Performed: LEFT RE-EXCISION OF BREAST LUMPECTOMY ERAS PATHWAY (Left )  Patient Location: PACU  Anesthesia Type:MAC  Level of Consciousness: awake, alert , oriented and patient cooperative  Airway & Oxygen Therapy: Patient Spontanous Breathing and Patient connected to face mask oxygen  Post-op Assessment: Report given to RN and Post -op Vital signs reviewed and stable  Post vital signs: Reviewed and stable  Last Vitals:  Vitals:   08/27/17 1015 08/27/17 1030  BP: 114/74 110/68  Pulse: 88 83  Resp: 18 14  Temp:    SpO2: 100% 100%    Last Pain:  Vitals:   08/27/17 1030  TempSrc:   PainSc: (P) 4       Patients Stated Pain Goal: 3 (76/81/15 7262)  Complications: No apparent anesthesia complications

## 2017-08-28 ENCOUNTER — Encounter (HOSPITAL_BASED_OUTPATIENT_CLINIC_OR_DEPARTMENT_OTHER): Payer: Self-pay | Admitting: General Surgery

## 2017-08-29 ENCOUNTER — Encounter: Payer: Self-pay | Admitting: Adult Health

## 2017-08-29 DIAGNOSIS — C50412 Malignant neoplasm of upper-outer quadrant of left female breast: Secondary | ICD-10-CM | POA: Insufficient documentation

## 2017-08-29 DIAGNOSIS — Z170 Estrogen receptor positive status [ER+]: Secondary | ICD-10-CM | POA: Insufficient documentation

## 2017-09-03 NOTE — Progress Notes (Signed)
Please let patient know margin is negative.

## 2017-09-04 ENCOUNTER — Encounter (HOSPITAL_COMMUNITY): Payer: Self-pay

## 2017-09-05 ENCOUNTER — Encounter: Payer: Self-pay | Admitting: Radiation Oncology

## 2017-09-05 ENCOUNTER — Encounter: Payer: Self-pay | Admitting: General Practice

## 2017-09-05 ENCOUNTER — Telehealth: Payer: Self-pay | Admitting: *Deleted

## 2017-09-05 DIAGNOSIS — Z170 Estrogen receptor positive status [ER+]: Principal | ICD-10-CM

## 2017-09-05 DIAGNOSIS — C50211 Malignant neoplasm of upper-inner quadrant of right female breast: Secondary | ICD-10-CM

## 2017-09-05 NOTE — Progress Notes (Unsigned)
[facility] Psychosocial Distress Screening Clinical Social Work  Clinical Social Work was referred by distress screening protocol.  The patient scored a 7 on the Psychosocial Distress Thermometer which indicates moderate distress. Clinical Social Worker Edwyna Shell LCSW to assess for distress and other psychosocial needs. Patient reports that she has good support system, has co worker who was diagnosed w breast cancer and is serving as an Marketing executive.  Reports strong family support, aware of resources for children of parents diagnosed w cancer.  Will request calendar of events at next clinic visit but feels she is doing well in adjusting to new diagnosis.  No further needs/concerns expressed.    ON[facility] DISTRESS SCREENING 07/25/2017  Screening Type Initial Screening  Distress experienced in past week (1-10) 7  Practical problem type Work/school  Emotional problem type Nervousness/Anxiety;Adjusting to illness  Physical Problem type Pain;Sleep/insomnia     Clinical Social Worker follow up needed: No.  If yes, follow up plan:

## 2017-09-05 NOTE — Telephone Encounter (Signed)
Received oncotype results of 12/8%. Physician team notified.  Called Marie Castro with results. Discussed no chemo is recommended. Received verbal understanding. Marie Castro wishes to keep her appt with dr. Tana Felts on 11/30 to discuss results. Referral placed for Marie Castro to see Dr.Kinard

## 2017-09-06 NOTE — Progress Notes (Signed)
Marie Castro  Telephone:(336) [phone] Fax:(336) [phone]     ID: Marie Castro DOB: 1971-04-16  MR#: [id number]  CEY#:[id number]  Patient Care Team: [name], PA-C as PCP - General (Nurse Practitioner) Magrinat, [name], MD as Consulting Physician (Oncology) [name], MD as Consulting Physician (General Surgery) [name], MD as Consulting Physician (Radiation Oncology) [name], PA-C as Physician Assistant (Obstetrics and Gynecology) Register, Amber, PA-C as Physician Assistant (Dermatology) OTHER MD:  CHIEF COMPLAINT: Invasive lobular breast cancer, estrogen receptor positive  CURRENT TREATMENT: Adjuvant radiation pending   HISTORY OF CURRENT ILLNESS: From the original intake note:  Jhoana had bilateral screening mammography at [facility] 07/06/2017, showing a new 1.5 cm oval mass in the right breast upper inner quadrant, a possible development asymmetry in the left breast upper outer quadrant, and a 0.3 cm area of calcifications in the left breast upper outer quadrant. On 07/11/2017 the patient underwent left diagnostic mammography with tomography and bilateral breast ultrasonography. The left breast mammography showed the breast density to be category C. In the upper-outer quadrant of the left breast there was a 0.8 cm oval mass which by ultrasound was a benign complicated cyst. There was also a 0.3 cm area of grouped calcifications in the left breast upper outer quadrant.  In the right breast, ultrasonography showed an irregular mass in the upper inner quadrant adjacent to a benign 1.5 cm simple cyst.  On 07/12/2017 she underwent bilateral biopsies. On the right there was an invasive lobular carcinoma, grade 1 or 2, estrogen receptor 80% positive with moderate staining intensity, progesterone receptor 95% positive with strong staining intensity, with an MIB-1 of 1%, and no HER-2 amplification, the signals ratio being 1.12 and the number per cell 1.85. (SAA  [id number])  On the left the biopsy showed atypical lobular hyperplasia.  Both axillae were sonographically benign  The patient's subsequent history is as detailed below.  INTERVAL HISTORY: Marie Castro returns today for follow-up and treatment of her estrogen receptor positive breast cancer,, accompanied by her husband.  On 08/16/2017 she underwent right lumpectomy and sentinel lymph node sampling, with the pathology ([id number]) showing a 1.6 cm invasive lobular carcinoma, grade 2, with negative margins.  There was also a separate area of ductal carcinoma in situ, low-grade, measuring 0.7 cm.  This was focally present at the superior margin.  All 3 sentinel lymph nodes were clear.  On 08/27/2017 she underwent additional surgery for margin clearance and this was successful.  She then had an Oncotype obtained from the definitive surgical sample.  The score was 12, predicting a 10-year risk of breast cancer recurrence outside the breast of 8% if her only systemic therapy is tamoxifen for 5 years.  It also predicts no benefit from chemotherapy.  She generally did well with the surgery.  She does endorse some discomfort and is taking tramadol 1-2 times a day. The patient is also experiencing some constipation, but she has not tried taking anything for relief.    REVIEW OF SYSTEMS: Batina is doing well and notes this was her first week back to work after her surgery. She denies unusual headaches, visual changes, nausea, vomiting, or dizziness. There has been no unusual cough, phlegm production, or pleurisy. This been no change in bladder habits. She denies unexplained fatigue or unexplained weight loss, bleeding, rash, or fever. A detailed review of systems was otherwise entirely stable.    PAST MEDICAL HISTORY: Past Medical History:  Diagnosis Date  . Anxiety   . Cancer ([place])  breast  . Family history of breast cancer   . Family history of heart disease   . Family history of prostate cancer   .  Hypothyroidism   . Thyroid disease     PAST SURGICAL HISTORY: Past Surgical History:  Procedure Laterality Date  . BREAST BIOPSY Right 07/12/2017  . BREAST LUMPECTOMY WITH RADIOACTIVE SEED AND SENTINEL LYMPH NODE BIOPSY Right 08/16/2017   Procedure: RIGHT BREAST LUMPECTOMY WITH RADIOACTIVE SEED AND RIGHT SENTINEL LYMPH NODE BIOPSY;  Surgeon: [name], MD;  Location: [facility];  Service: General;  Laterality: Right;  . BREAST LUMPECTOMY WITH RADIOACTIVE SEED LOCALIZATION Left 08/16/2017   Procedure: LEFT BREAST PARTIAL MASTECTOMY WITH BRACKETED RADIOACTIVE SEED LOCALIZATION;  Surgeon: [name], MD;  Location: [facility];  Service: General;  Laterality: Left;  . DILATION AND CURETTAGE OF UTERUS    . KNEE SURGERY    . MULTIPLE TOOTH EXTRACTIONS    . RE-EXCISION OF BREAST LUMPECTOMY Left 08/27/2017   Procedure: LEFT RE-EXCISION OF BREAST LUMPECTOMY ERAS PATHWAY;  Surgeon: [name], MD;  Location: [facility];  Service: General;  Laterality: Left;  . WISDOM TOOTH EXTRACTION      FAMILY HISTORY Family History  Problem Relation Age of Onset  . Cancer Mother        SKIN AND BREAST  . Heart disease Mother   . Heart disease Father   . Hyperlipidemia Father   . Depression Father   . Cancer Maternal Grandmother        BREAST  . Melanoma Maternal Grandmother        toe  . Cancer Maternal Grandfather        PROSTATE  . Irritable bowel syndrome Paternal Grandmother   . Heart disease Paternal Grandfather   . Breast cancer Maternal Aunt 72  . Breast cancer Maternal Aunt        dx in her 12s  The patient's father is 35 year old and her mother 69 year old as of October 2018. The patient has one sister, no brothers. The patient's mother and mother's mother both had breast cancer in their 61s. A maternal aunt had breast cancer at age 25, and another at age 26. The patient's mother has had genetics testing, which was negative. Please also consulted the detailed genetics pedigree  in [place]:  No LMP recorded. Menarche age 17, first live birth age 17, she is still premenopausal, with regular periods lasting approximately 5 days, of which 3 are heavy. She used oral contraceptives approximately 2 years with no complications and also had a subcutaneous depot contraceptive briefly. 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 Her husband Hospital doctor ("Chip") is a Control and instrumentation engineer for [employer]. Their son, Trace, is 80 and daughter [name] 18 as of October 2018. The patient attends the [employer] DIRECTIVES:    HEALTH MAINTENANCE: Social History   Tobacco Use  . Smoking status: Never Smoker  . Smokeless tobacco: Never Used  Substance Use Topics  . Alcohol use: No  . Drug use: No     Colonoscopy: Never  PAP:  Bone density: Never   Allergies  Allergen Reactions  . Oxycodone Other (See Comments)    Pt states could not sleep at all, was up 24 hrs  . Latex Rash    Pt states rubber bands from her braces causes  ulcers in her mouth    Current Outpatient Medications  Medication Sig Dispense Refill  . diphenhydrAMINE (BENADRYL) 12.5 MG/5ML liquid Take 37.5 mg by mouth at bedtime as needed for sleep.    Marland Kitchen ibuprofen (ADVIL,MOTRIN) 200 MG tablet Take 400-600 mg by mouth daily as needed for headache or moderate pain.    Marland Kitchen levothyroxine (SYNTHROID, LEVOTHROID) 100 MCG tablet Take 100 mcg by mouth daily before breakfast.    . LORazepam (ATIVAN) 0.5 MG tablet Take 0.5 mg by mouth 2 (two) times daily as needed for anxiety.    . Multiple Vitamins-Minerals (MULTIVITAMIN GUMMIES WOMENS) CHEW Chew 2 tablets by mouth daily.    . tamoxifen (NOLVADEX) 20 MG tablet Take 20 mg daily by mouth.    . traMADol (ULTRAM) 50 MG tablet Take every 6 (six) hours as needed by mouth.     No current  facility-administered medications for this visit.     OBJECTIVE: Young white woman who appears stated age  12:   09/07/17 1100  BP: (!) 114/57  Pulse: 62  Resp: 18  Temp: 98 F (36.7 C)  SpO2: 100%     Body mass index is 24.84 kg/m.   Wt Readings from Last 3 Encounters:  09/07/17 153 lb 14.4 oz (69.8 kg)  08/27/17 154 lb 6 oz (70 kg)  08/16/17 152 lb 6 oz (69.1 kg)      ECOG FS:1 - Symptomatic but completely ambulatory  Sclerae unicteric, pupils round and equal Oropharynx clear and moist No cervical or supraclavicular adenopathy Lungs no rales or rhonchi Heart regular rate and rhythm Abd soft, nontender, positive bowel sounds MSK no focal spinal tenderness, no upper extremity lymphedema Neuro: nonfocal, well oriented, appropriate affect Breasts: The right breast is status post recent lumpectomy.  The incision is healing very nicely.  The cosmetic result is good.  There is some hyperesthesia in the axillary region but no erythema or swelling.  The left breast is unremarkable.  The left axilla is benign.  LAB RESULTS:  CMP     Component Value Date/Time   NA 136 08/10/2017 0851   K 4.4 08/10/2017 0851   CL 104 08/10/2017 0851   CO2 28 08/10/2017 0851   GLUCOSE 75 08/10/2017 0851   BUN 10 08/10/2017 0851   CREATININE 0.89 08/10/2017 0851   CALCIUM 9.3 08/10/2017 0851   GFRNONAA >60 08/10/2017 0851   GFRAA >60 08/10/2017 0851    No results found for: Ronnald Ramp, A1GS, A2GS, BETS, BETA2SER, GAMS, MSPIKE, SPEI  No results found for: Nils Pyle, [facility]  Lab Results  Component Value Date   WBC 5.5 08/10/2017   HGB 13.7 08/10/2017   HCT 40.8 08/10/2017   MCV 86.8 08/10/2017   PLT 287 08/10/2017      Chemistry      Component Value Date/Time   NA 136 08/10/2017 0851   K 4.4 08/10/2017 0851   CL 104 08/10/2017 0851   CO2 28 08/10/2017 0851   BUN 10 08/10/2017 0851   CREATININE 0.89 08/10/2017 0851      Component Value  Date/Time   CALCIUM 9.3 08/10/2017 0851       No results found for: LABCA2  No components found for: DPOEUM353  No results for input(s): INR in the last 168 hours.  No results found for: LABCA2  No results found for: IRW431  No results found for: VQM086  No results found for: PYP950  No results found for: CA2729  No components found for: HGQUANT  No results found for: CEA1 / No results found for: CEA1   No results found for: AFPTUMOR  No results found for: CHROMOGRNA  No results found for: PSA1  No visits with results within 3 Day(s) from this visit.  Latest known visit with results is:  Hospital Outpatient Visit on 08/10/2017  Component Date Value Ref Range Status  . WBC 08/10/2017 5.5  4.0 - 10.5 K/uL Final  . RBC 08/10/2017 4.70  3.87 - 5.11 MIL/uL Final  . Hemoglobin 08/10/2017 13.7  12.0 - 15.0 g/dL Final  . HCT 08/10/2017 40.8  36.0 - 46.0 % Final  . MCV 08/10/2017 86.8  78.0 - 100.0 fL Final  . MCH 08/10/2017 29.1  26.0 - 34.0 pg Final  . MCHC 08/10/2017 33.6  30.0 - 36.0 g/dL Final  . RDW 08/10/2017 12.4  11.5 - 15.5 % Final  . Platelets 08/10/2017 287  150 - 400 K/uL Final  . Sodium 08/10/2017 136  135 - 145 mmol/L Final  . Potassium 08/10/2017 4.4  3.5 - 5.1 mmol/L Final  . Chloride 08/10/2017 104  101 - 111 mmol/L Final  . CO2 08/10/2017 28  22 - 32 mmol/L Final  . Glucose, Bld 08/10/2017 75  65 - 99 mg/dL Final  . BUN 08/10/2017 10  6 - 20 mg/dL Final  . Creatinine, Ser 08/10/2017 0.89  0.44 - 1.00 mg/dL Final  . Calcium 08/10/2017 9.3  8.9 - 10.3 mg/dL Final  . GFR calc non Af Amer 08/10/2017 >60  >60 mL/min Final  . GFR calc Af Amer 08/10/2017 >60  >60 mL/min Final   Comment: (NOTE) The eGFR has been calculated using the CKD EPI equation. This calculation has not been validated in all clinical situations. eGFR's persistently <60 mL/min signify possible Chronic Kidney Disease.   . Anion gap 08/10/2017 4* 5 - 15 Final  . Preg, Serum 08/10/2017  NEGATIVE  NEGATIVE Final   Comment:        THE SENSITIVITY OF THIS METHODOLOGY IS >10 mIU/mL.     (this displays the last labs from the last 3 days)  No results found for: TOTALPROTELP, ALBUMINELP, A1GS, A2GS, BETS, BETA2SER, GAMS, MSPIKE, SPEI (this displays SPEP labs)  No results found for: KPAFRELGTCHN, LAMBDASER, KAPLAMBRATIO (kappa/lambda light chains)  No results found for: HGBA, HGBA2QUANT, HGBFQUANT, HGBSQUAN (Hemoglobinopathy evaluation)   No results found for: LDH  No results found for: IRON, TIBC, IRONPCTSAT (Iron and TIBC)  No results found for: FERRITIN  Urinalysis No results found for: COLORURINE, APPEARANCEUR, LABSPEC, PHURINE, GLUCOSEU, HGBUR, BILIRUBINUR, KETONESUR, PROTEINUR, UROBILINOGEN, NITRITE, LEUKOCYTESUR   STUDIES: Nm Sentinel Node Inj-no Rpt (breast)  Result Date: 08/16/2017 Sulfur colloid was injected by the nuclear medicine technologist for melanoma sentinel node.    ELIGIBLE FOR AVAILABLE RESEARCH PROTOCOL: no  ASSESSMENT: 46 y.o. [place] woman status post right breast upper inner quadrant biopsy 07/11/2017 for a clinical T1c N0, stage IA invasive lobular carcinoma, E-cadherin negative, grade 1 or 2, estrogen and progesterone receptor positive, HER-2 nonamplified, with an MIB-1 of 1%  (1) biopsy of left breast upper outer quadrant calcifications 07/11/2017 showed atypical lobular hyperplasia  (a) breast MRI 07/19/2017 shows 2 additional areas of concern in the left breast, with MRI guided biopsy scheduled 07/25/2017  (2) genetics testing 07/27/2017 through the STAT Breast cancer panel offered by [facility] found no deleterious mutations in ATM, BRCA1, BRCA2, CDH1, CHEK2, PALB2, PTEN, STK11 and TP53.   Reflexed to the Multi-Gene Panel offered by [facility] finding no deleterious mutations  in ALK, APC, ATM, AXIN2,BAP1,  BARD1, BLM, BMPR1A, BRCA1, BRCA2, BRIP1, CASR, CDC73, CDH1, CDK4, CDKN1B, CDKN1C, CDKN2A (p14ARF), CDKN2A (p16INK4a), CEBPA, CHEK2,  CTNNA1, DICER1, DIS3L2, EGFR (c.2369C>T, p.Thr790Met variant only), EPCAM (Deletion/duplication testing only), FH, FLCN, GATA2, GPC3, GREM1 (Promoter region deletion/duplication testing only), HOXB13 (c.251G>A, p.Gly84Glu), HRAS, KIT, MAX, MEN1, MET, MITF (c.952G>A, p.Glu318Lys variant only), MLH1, MSH2, MSH3, MSH6, MUTYH, NBN, NF1, NF2, NTHL1, PALB2, PDGFRA, PHOX2B, PMS2, POLD1, POLE, POT1, PRKAR1A, PTCH1, PTEN, RAD50, RAD51C, RAD51D, RB1, RECQL4, RET, RUNX1, SDHAF2, SDHA (sequence changes only), SDHB, SDHC, SDHD, SMAD4, SMARCA4, SMARCB1, SMARCE1, STK11, SUFU, TERT, TERT, TMEM127, TP53, TSC1, TSC2, VHL, WRN and WT1.   (3) tamoxifen started neoadjuvantly 07/24/2017  (4) status post right lumpectomy and sentinel lymph node sampling 08/16/2017 for a pT1c pN0, stage IA invasive lobular carcinoma, grade 2, with positive margins for DCIS  (a) additional surgery for margin clearance 08/27/2017 was successful ([id number].  (5) Oncotype DX score of 12 predicts a 10-year risk of recurrence outside the breast of 8% if the patient's only systemic therapy is tamoxifen for 5 years.  It also predicts no benefit from chemotherapy.  (6) adjuvant radiation pending  (7) to continue anti-estrogens a minimum of 5 years, then consider 2 years additional aromatase inhibitor treatment.  PLAN: I spent approximately 30 minutes with Hildred Alamin with most of that time spent discussing her surgical results, prognosis, and treatment options.  We reviewed the fact that she is dealing with a lobular breast cancer, and these generally do not get much benefit from chemotherapy.  This was confirmed by her low Oncotype score.  She understands she has a 92% chance of not having a recurrence outside the breast within the next 10 years if she takes tamoxifen for 5 years.  If in addition she then takes an aromatase inhibitor for 2 additional years she can bring that risk down to 2-3 additional percentage points.  That will be our plan.  She  is tolerating tamoxifen well.  She is still premenopausal.  Currently they are using barrier contraceptive methods.  She is going to be meeting with her gynecologist to discuss alternatives.  I find that in patients on tamoxifen the Mirena IUD works particularly well.  The small amount of progesterone it provides tends to decrease the concern regarding endometrial hyper plasia.  Undercover of tamoxifen this has been found to be safe in observational studies.  Her next up is adjuvant radiation.  She is already going to be meeting with Dr. Sondra Come next week.  She likely will be receiving radiation into January.  I am going to see her again in February 2019 and we will initiate long-term follow-up after that  She knows to call for any problems that may develop before the next visit.   Magrinat, [name], MD  09/07/17 11:44 AM Medical Oncology and Hematology [facility] [address] [place], [place] [address] Tel. [phone]    Fax. [phone]  This document serves as a record of services personally performed by [name], MD. It was created on his behalf by Margit Banda, a trained medical scribe. The creation of this record is based on the scribe's personal observations and the provider's statements to them.   I have reviewed the above documentation for accuracy and completeness, and I agree with the above.

## 2017-09-07 ENCOUNTER — Ambulatory Visit (HOSPITAL_BASED_OUTPATIENT_CLINIC_OR_DEPARTMENT_OTHER): Payer: BC Managed Care – PPO | Admitting: Oncology

## 2017-09-07 VITALS — BP 114/57 | HR 62 | Temp 98.0°F | Resp 18 | Ht 66.0 in | Wt 153.9 lb

## 2017-09-07 DIAGNOSIS — Z170 Estrogen receptor positive status [ER+]: Secondary | ICD-10-CM

## 2017-09-07 DIAGNOSIS — C50211 Malignant neoplasm of upper-inner quadrant of right female breast: Secondary | ICD-10-CM

## 2017-09-07 DIAGNOSIS — C50412 Malignant neoplasm of upper-outer quadrant of left female breast: Secondary | ICD-10-CM

## 2017-09-07 NOTE — Progress Notes (Signed)
Location of Breast Cancer: right breast cancer  Histology per Pathology Report:   08/27/17 Diagnosis Breast, excision, Left PREVIOUS RESECTION SITE CHANGES MARGINS OF RESECTION ARE NEGATIVE FOR CARCINOMA  08/16/17 Diagnosis 1. Breast, lumpectomy, right INVASIVE LOBULAR CARCINOMA, GRADE 2, SPANNING 1.6 CM LOBULAR CARCINOMA IN SITU ALL MARGINS OF RESECTION ARE NEGATIVE FOR CARCINOMA INVASIVE LOBULAR CARCINOMA IS FOCALLY 1 MM FROM SUPPERIOR AND MEDIAL MARGIN 2. Breast, lumpectomy, left partial DUCTAL CARCINOMA IN SITU, GRADE 1, SPANNING 0.7 CM DCIS IS FOCALLY PRESENTED AT THE SUPERIOR INKED MARGIN PREVIOUS BIOPSY SITE CHANGES COLUMNAR CELL METAPLASIA WITH FLAT ATYPIA FIBROCYSTIC CHANGES WITH CALCIFICATIONS 3. Medical device, removal, radioactive seed RADIOACTIVE SEED, GROSS DIAGNOSIS ONLY 4. Lymph node, sentinel, biopsy, right #1 ONE BENIGN LYMPH NODE (0/1) 5. Lymph node, sentinel, biopsy, right #2 ONE BENIGN LYMPH NODE (0/1) 6. Lymph node, sentinel, biopsy, right #3 ONE BENIGN LYMPH NODE (0/1)  07/12/17 Diagnosis 1. Breast, right, needle core biopsy - INVASIVE AND IN SITU MAMMARY CARCINOMA. - SEE COMMENT. 2. Breast, left, needle core biopsy - LOBULAR NEOPLASIA (ATYPICAL LOBULAR HYPERPLASIA). - FIBROCYSTIC CHANGES WITH ADENOSIS AND CALCIFICATIONS.  Receptor Status: ER(80%), PR (95%), Her2-neu (negative), Ki-(1%)  Did patient present with symptoms (if so, please note symptoms) or was this found on screening mammography?: screening mammogram  Past/Anticipated interventions by surgeon, if any: BIL mastectomies? Biopsies of left breast done today by Dr. Barry Dienes.   Past/Anticipated interventions by medical oncology, if any: oncotype results of 12/8%. Taking Tamoxifen.   Lymphedema issues, if any:  no   Pain issues, if any:  Has some sensitivity in the left breast.  SAFETY ISSUES:  Prior radiation? no  Pacemaker/ICD? no  Possible current pregnancy?no  Is the patient  on methotrexate? no  Current Complaints / other details:    BP 102/63 (BP Location: Left Arm, Patient Position: Sitting)   Pulse 67   Temp 98.2 F (36.8 C) (Oral)   Ht _0  (1.676 m)   Wt 154 lb 12.8 oz (70.2 kg)   SpO2 100%   BMI 24.99 kg/m    Wt Readings from Last 3 Encounters:  09/12/17 154 lb 12.8 oz (70.2 kg)  09/07/17 153 lb 14.4 oz (69.8 kg)  08/27/17 154 lb 6 oz (70 kg)

## 2017-09-12 ENCOUNTER — Ambulatory Visit
Admission: RE | Admit: 2017-09-12 | Discharge: 2017-09-12 | Disposition: A | Discharge status: Home / Self Care | Payer: BC Managed Care – PPO | Source: Ambulatory Visit | Attending: Radiation Oncology | Admitting: Radiation Oncology

## 2017-09-12 ENCOUNTER — Other Ambulatory Visit: Payer: Self-pay

## 2017-09-12 ENCOUNTER — Encounter: Payer: Self-pay | Admitting: Radiation Oncology

## 2017-09-12 DIAGNOSIS — Z170 Estrogen receptor positive status [ER+]: Principal | ICD-10-CM

## 2017-09-12 DIAGNOSIS — C50211 Malignant neoplasm of upper-inner quadrant of right female breast: Secondary | ICD-10-CM

## 2017-09-12 DIAGNOSIS — C50412 Malignant neoplasm of upper-outer quadrant of left female breast: Secondary | ICD-10-CM

## 2017-09-12 DIAGNOSIS — Z510 Encounter for antineoplastic radiation therapy: Secondary | ICD-10-CM | POA: Diagnosis not present

## 2017-09-12 NOTE — Progress Notes (Signed)
Radiation Oncology         [phone] ________________________________  Name: Marie Castro MRN: [id number]  Date: 09/12/2017  DOB: September 26, 1971  Follow-Up Visit Note  CC: [name], PA-C  Magrinat, [name], MD    ICD-10-CM   1. Malignant neoplasm of upper-inner quadrant of right breast in female, estrogen receptor positive ([place]) C50.211    Z17.0   2. 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  The patient was initially seen in the multidisciplinary breast clinic. Since her initial evaluation the patient has undergone her definitive surgery involving the left and right breast. Patient did require reexcision on the left side in light of focally positive superior inked margin. This margin was cleared on reexcision November 19.   Patient does have some soreness along the right shoulder but this is improving quickly. She denies any swelling or numbness in her right arm or hand. Pt states that when she stretches in the morning she feels a little discomfort in her shoulder area. Pt states that she is taking tramadol once a night to alleviate pain in her shoulder. 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 Pt states that she has been to PT for her shoulder  Pt denies swelling, infections, puffiness in her arm.   ALLERGIES:  is allergic to oxycodone and latex.  Meds: Current Outpatient Medications  Medication Sig Dispense Refill  . ibuprofen (ADVIL,MOTRIN) 200 MG tablet Take 400-600 mg by mouth daily as needed for headache or moderate pain.    Marland Kitchen levothyroxine (SYNTHROID, LEVOTHROID) 100 MCG tablet Take 100 mcg by mouth daily  before breakfast.    . Multiple Vitamins-Minerals (MULTIVITAMIN GUMMIES WOMENS) CHEW Chew 2 tablets by mouth daily.    . tamoxifen (NOLVADEX) 20 MG tablet Take 20 mg daily by mouth.    . traMADol (ULTRAM) 50 MG tablet Take every 6 (six) hours as needed by mouth.    . diphenhydrAMINE (BENADRYL) 12.5 MG/5ML liquid Take 37.5 mg by mouth at bedtime as needed for sleep.    Marland Kitchen LORazepam (ATIVAN) 0.5 MG tablet Take 0.5 mg by mouth 2 (two) times daily as needed for anxiety.     No current facility-administered medications for this encounter.     Physical Findings: The patient is in no acute distress. Patient is alert and oriented.  height is 5\' 6"  (1.676 m) and weight is 154 lb 12.8 oz (70.2 kg). Her oral temperature is 98.2 F (36.8 C). Her blood pressure is 102/63 and her pulse is 67. Her oxygen saturation is 100%. .  No significant changes.  Lungs are clear to auscultation bilaterally. Heart has regular rate and rhythm. No palpable cervical, supraclavicular, or axillary adenopathy. Abdomen soft, non-tender, normal bowel sounds.  Left breast: Well healing scar in the UOQ with no signs of drainage or infection.  Right breast: Pt has as a peri-areolar scar which is healing well with no signs of drainage or infection. Pt has a seperate scar on the axilla region from sentinel node procedure.   Lab Findings: Lab Results  Component Value Date   WBC  5.5 08/10/2017   HGB 13.7 08/10/2017   HCT 40.8 08/10/2017   MCV 86.8 08/10/2017   PLT 287 08/10/2017    Radiographic Findings: Nm Sentinel Node Inj-no Rpt (breast)  Result Date: 08/16/2017 Sulfur colloid was injected by the nuclear medicine technologist for melanoma sentinel node.    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 I discussed the  course of treatment side effects and potential long-term toxicities of radiation therapy as part of breast conservation treatment. The patient appears to understand wishes to proceed with planned course of treatment.   Plan:  The pt will begin radiation therapy approximately 6 weeks post-op. I anticipate 6-1/2 weeks of radiation therapy directed at both breast areas. The patient would not be a good candidate for hypo-fractionated treatment given the potential for overlap over the sternum area with bilateral breast radiation treatments and her relatively young age.  -----------------------------------  Blair Promise, PhD, MD   This document serves as a record of services personally performed by [name], MD. It was created on his behalf by Valeta Harms, a trained medical scribe. The creation of this record is based on the scribe's personal observations and the provider's statements to them. This document has been checked and approved by the attending provider.

## 2017-09-24 ENCOUNTER — Ambulatory Visit
Admission: RE | Admit: 2017-09-24 | Discharge: 2017-09-24 | Disposition: A | Discharge status: Home / Self Care | Payer: BC Managed Care – PPO | Source: Ambulatory Visit | Attending: Radiation Oncology | Admitting: Radiation Oncology

## 2017-09-24 DIAGNOSIS — Z510 Encounter for antineoplastic radiation therapy: Secondary | ICD-10-CM | POA: Diagnosis not present

## 2017-09-24 DIAGNOSIS — C50211 Malignant neoplasm of upper-inner quadrant of right female breast: Secondary | ICD-10-CM

## 2017-09-24 DIAGNOSIS — Z170 Estrogen receptor positive status [ER+]: Principal | ICD-10-CM

## 2017-09-24 DIAGNOSIS — C50412 Malignant neoplasm of upper-outer quadrant of left female breast: Secondary | ICD-10-CM

## 2017-09-25 ENCOUNTER — Ambulatory Visit: Payer: BC Managed Care – PPO | Attending: General Surgery | Admitting: Physical Therapy

## 2017-09-25 DIAGNOSIS — Z510 Encounter for antineoplastic radiation therapy: Secondary | ICD-10-CM | POA: Diagnosis not present

## 2017-09-25 DIAGNOSIS — C50412 Malignant neoplasm of upper-outer quadrant of left female breast: Secondary | ICD-10-CM | POA: Diagnosis not present

## 2017-09-25 DIAGNOSIS — M25611 Stiffness of right shoulder, not elsewhere classified: Secondary | ICD-10-CM | POA: Diagnosis present

## 2017-09-25 DIAGNOSIS — R29898 Other symptoms and signs involving the musculoskeletal system: Secondary | ICD-10-CM | POA: Diagnosis present

## 2017-09-25 DIAGNOSIS — M25612 Stiffness of left shoulder, not elsewhere classified: Secondary | ICD-10-CM

## 2017-09-25 DIAGNOSIS — M25511 Pain in right shoulder: Secondary | ICD-10-CM | POA: Diagnosis present

## 2017-09-25 DIAGNOSIS — Z170 Estrogen receptor positive status [ER+]: Secondary | ICD-10-CM | POA: Diagnosis not present

## 2017-09-25 NOTE — Patient Instructions (Signed)
[facility] [address] [place], [place] [address]         [phone]  Why exercise?  So many benefits! Here are SOME of them: 1. Heart health, including raising your good cholesterol level and reducing heart rate and blood pressure 2. Lung health, including improved lung capacity 3. It burns fats, and most of us can stand to be leaner, whether or not we are overweight. 4. It increases the body's natural painkillers and mood elevators, so makes you feel better. 5. Not only makes you feel better, but look better too 6. Improves sleep 7. Takes a bite out of stress 8. May decrease your risk of many types of cancer 9. If you are currently undergoing cancer treatment, exercise may improve your ability to tolerate treatments including chemotherapy. 10. For everybody, it can improve your energy level. Those with cancer-related fatigue report a 40-50% reduction in this symptom when exercising regularly. 11. If you are a survivor of breast, colon, or prostate cancer, it may decrease your risk of a recurrence. (This may hold for other cancers too, but so far we have data just for these three types.)  How to exercise: 1. Get your doctor's okay. 2. Pick something you enjoy doing, like walking, Zumba, biking, swimming, or whatever. 3. Start at low intensity and time, then gradually increase.  (See walking program handout.) 4. Set a goal to achieve over time.  The American Cancer Society, American Heart Association, and U.S. Dept. of Health and Human Services recommend 150 minutes of moderate exercise, 75 minutes of vigorous exercise, or a combination of both per week. This should be done in episodes at least 10 minutes long, spread throughout the week.  Need help being motivated? 1. Pick something you enjoy doing, because you'll be more inclined to stick with that activity than something that feels like a chore. 2. Do it with a friend so that you are accountable to each  other. 3. Schedule it into your day. Place it on your calendar and keep that appointment just like you do any appointment that you make. 4. Join an exercise group that meets at a specific time.  That way, you have to show up on time, and that makes it harder to procrastinate about doing your workout.  It also keeps you accountable-people begin to expect you to be there. 5. Join a gym where you feel comfortable and not intimidated, at the right cost. 6. Sign up for something that you'll need to be in shape for on a specific date, like a 1K or a 5K to walk or run, a 20 or 30 mile bike ride, a mud run or something like that. If the date is looming, you know you'll need to train to be ready for it.  An added benefit is that many of these are fundraisers for good causes. 7. If you've already paid for a gym membership, group exercise class or event, you might as well work out, so you haven't wasted your money!  

## 2017-09-25 NOTE — Therapy (Signed)
[place], [place], [address] Phone: [phone]   Fax:  [phone]  Physical Therapy Evaluation  Patient Details  Name: Marie Castro MRN: [id number] Date of Birth: [birth year] Referring Provider: Dr. [name]   Encounter Date: 09/25/2017  PT End of Session - 09/25/17 1716    Visit Number  1    Number of Visits  6    Date for PT Re-Evaluation  10/26/17    PT Start Time  1612    PT Stop Time  1705    PT Time Calculation (min)  53 min    Activity Tolerance  Patient tolerated treatment well    Behavior During Therapy  [facility] for tasks assessed/performed       Past Medical History:  Diagnosis Date  . Anxiety   . Cancer [facility])    breast  . Family history of breast cancer   . Family history of heart disease   . Family history of prostate cancer   . Hypothyroidism   . Thyroid disease     Past Surgical History:  Procedure Laterality Date  . BREAST BIOPSY Right 07/12/2017  . BREAST LUMPECTOMY WITH RADIOACTIVE SEED AND SENTINEL LYMPH NODE BIOPSY Right 08/16/2017   Procedure: RIGHT BREAST LUMPECTOMY WITH RADIOACTIVE SEED AND RIGHT SENTINEL LYMPH NODE BIOPSY;  Surgeon: [name], MD;  Location: [place];  Service: General;  Laterality: Right;  . BREAST LUMPECTOMY WITH RADIOACTIVE SEED LOCALIZATION Left 08/16/2017   Procedure: LEFT BREAST PARTIAL MASTECTOMY WITH BRACKETED RADIOACTIVE SEED LOCALIZATION;  Surgeon: [name], MD;  Location: [place];  Service: General;  Laterality: Left;  . DILATION AND CURETTAGE OF UTERUS    . KNEE SURGERY    . MULTIPLE TOOTH EXTRACTIONS    . RE-EXCISION OF BREAST LUMPECTOMY Left 08/27/2017   Procedure: LEFT RE-EXCISION OF BREAST LUMPECTOMY ERAS PATHWAY;  Surgeon: [name], MD;  Location: [place];  Service: General;  Laterality: Left;  . WISDOM TOOTH EXTRACTION      There were no vitals filed for this visit.   Subjective Assessment - 09/25/17  1615    Subjective  Dr. Barry Dienes suggested I come.  My husband had some melanoma removed from his chest. he went through the same lymph node testing so I know it's good to check that everything's healing right.     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  Right lumpectomy and partial mastectomy on left 08/16/17 and reexcision for clear margins on the left 08/27/17; sentinel nodes removed from right, and those were negative. Will start XRT 10/03/17 and will have about 33 treatments.  Is on tamoxifen, which she started before surgery.  Hypothyroid, situational anxiety. h/o right shoulder physical therapy due to pain, which included dry needling; tries to avoid positions and movements that cause pain.    Patient Stated Goals  be painfree, great ROM    Currently in Pain?  Yes    Pain Score  0-No pain    Pain Location  Shoulder    Pain Orientation  Right    Aggravating Factors   internal rotation with extension    Pain Relieving Factors  avoiding those motions         [facility] PT Assessment - 09/25/17 0001      Assessment   Medical Diagnosis  right invasive breast cancer and left DCIS    Referring Provider  Dr. Dorris Fetch  [place]    Onset Date/Surgical Date  08/16/17 and 08/27/17    Hand Dominance  Right    Prior Therapy  was getting dry needling for right shoulder about a year and a half ago      Precautions   Precautions  Other (comment)    Precaution Comments  cancer precautions      Restrictions   Weight Bearing Restrictions  No      Balance Screen   Has the patient fallen in the past 6 months  No    Has the patient had a decrease in activity level because of a fear of falling?   No    Is the patient reluctant to leave their home because of a fear of falling?   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 notes discomfort inferior to left breast with raising left arm    AROM Assessment Site  Shoulder    Right/Left Shoulder  Right;Left    Right Shoulder Flexion  150 Degrees    Right Shoulder ABduction  180 Degrees    Right Shoulder Internal Rotation  -- in supine, WFL    Right Shoulder External Rotation  51 Degrees pinches in shoulder    Left Shoulder Flexion  152 Degrees    Left Shoulder ABduction  182 Degrees    Left Shoulder Internal Rotation  -- in supine, WFL    Left Shoulder External Rotation  70 Degrees        LYMPHEDEMA/ONCOLOGY QUESTIONNAIRE - 09/25/17 1643      Lymphedema Assessments   Lymphedema Assessments  Upper extremities      Right Upper Extremity Lymphedema   10 cm Proximal to Olecranon Process  28.8 cm    Olecranon Process  23.7 cm    10 cm Proximal to Ulnar Styloid Process  20.7 cm    Just Proximal to Ulnar Styloid Process  14.5 cm    Across Hand at [employer]  18.6 cm    At [place] of 2nd Digit  6 cm      Left Upper Extremity Lymphedema   10 cm Proximal to Olecranon Process  29.2 cm    Olecranon Process  24.1 cm    10 cm Proximal to Ulnar Styloid Process  20.4 cm    Just Proximal to Ulnar Styloid Process  14.8 cm    Across Hand at [employer]  18.6 cm    At [place] of 2nd Digit  5.8 cm        Quick Dash - 09/25/17 0001    Open a tight or new jar  No difficulty  Do heavy household chores  (wash walls, wash floors)  Mild difficulty    Carry a shopping bag or briefcase  Mild difficulty    Wash your back  Mild difficulty    Use a knife to cut food  No difficulty    Recreational activities in which you take some force or impact through your arm, shoulder, or hand (golf, hammering, tennis)  Mild difficulty    During the past week, to what extent has your arm, shoulder or hand problem interfered with your normal social activities with family, friends, neighbors, or groups?  Slightly    During the past week, to what extent has your arm, shoulder or hand problem limited your work or other regular daily activities  Slightly    Arm, shoulder, or hand pain.  Mild    Tingling (pins and needles) in your arm, shoulder, or hand  None    Difficulty Sleeping  Moderate difficulty    DASH Score  20.45 %       Objective measurements completed on examination: See above findings.              PT Education - 09/25/17 1714    Education provided  Yes    Education Details  about evidence that regular exercise statistically reduces recurrence risk, "Why Exercise?" flyer; briefly started lymphedema risk reduction education including no BP or lab draws on right; about getting a compression sleeve    Person(s) Educated  Patient    Methods  Explanation;Handout Why Exercise?, ABC class handout    Comprehension  Verbalized understanding          PT Long Term Goals - 09/25/17 1723      PT LONG TERM GOAL #1   Title  Pt. will be independent in HEP for shoulder ROM.    Time  2    Period  Weeks    Status  New      PT LONG TERM GOAL #2   Title  Pt. will be knowledgeable about lymphedema risk reduction    Time  2    Period  Weeks    Status  New      PT LONG TERM GOAL #3   Title  Pt. will report at least 50% decreased pain inferior to left breast with raising left arm    Time  2    Period  Weeks    Status  New      PT LONG TERM GOAL #4   Title  Quick DASH score reduced to 10 or  below    Baseline  20 on eval    Time  2    Period  Weeks             Plan - 09/25/17 1717    Clinical Impression Statement  This is a pleasant woman s/p lumpectomy for right invasive breast cancer and partial mastectomy for left DCIS.  She has mild limitations in shoulder ROM and discomfort with some motions. She has a pulling feeling that is uncomfortable inferior to her left breast when she raises the left arm.  She has a slight risk for lymphedema on the right.    History and Personal Factors relevant to plan of care:  h/o right shoulder pain and had therapy for that, though couldn't complete the course of it, about 1.5 years ago    Clinical Presentation  Evolving    Clinical Presentation due to:  starting XRT next week    Clinical Decision Making  Moderate    Rehab Potential  Excellent    Clinical Impairments Affecting Rehab Potential  h/o right shoulder pain, ongoing    PT Frequency  2x / week    PT Duration  2 weeks    PT Treatment/Interventions  ADLs/Self Care Home Management;Electrical Stimulation;DME Instruction;Therapeutic exercise;Patient/family education;Orthotic Fit/Training;Manual techniques;Scar mobilization;Passive range of motion    PT Next Visit Plan  Show HEP for shoulder flexion and ER stretches; begin myofascial release and scar mobilization for left > right chest/shoulder; teach [place] exercises for right shoulder (has theraband already); lymphedema risk reduction education, and go over in more detail about when to wear compression sleeve    Recommended Other Services  suggested ABC class but she is unable to come due to the time it's given; see [facility] about compression sleeve    Consulted and Agree with Plan of Care  Patient       Patient will benefit from skilled therapeutic intervention in order to improve the following deficits and impairments:  Decreased range of motion, Increased fascial restricitons, Pain, Decreased knowledge of precautions,  Decreased knowledge of use of DME  Visit Diagnosis: Stiffness of right shoulder, not elsewhere classified - Plan: PT plan of care cert/re-cert  Right shoulder pain, unspecified chronicity - Plan: PT plan of care cert/re-cert  Stiffness of left shoulder, not elsewhere classified - Plan: PT plan of care cert/re-cert  Other symptoms and signs involving the musculoskeletal system - Plan: PT plan of care cert/re-cert     Problem List Patient Active Problem List   Diagnosis Date Noted  . Malignant neoplasm of upper-outer quadrant of left breast in female, estrogen receptor positive ([place]) 08/29/2017  . Genetic testing 07/31/2017  . Malignant neoplasm of upper-inner quadrant of right breast in female, estrogen receptor positive ([place]) 07/24/2017  . Family history of breast cancer   . Family history of prostate cancer     SALISBURY,DONNA 09/25/2017, 5:28 PM  [place], [place], [address] Phone: [phone]   Fax:  [phone]  Name: Marie Castro MRN: [id number] Date of Birth: Feb 24, 1971  Serafina Royals, PT 09/25/17 5:28 PM

## 2017-09-26 ENCOUNTER — Ambulatory Visit: Payer: BC Managed Care – PPO

## 2017-09-26 DIAGNOSIS — R29898 Other symptoms and signs involving the musculoskeletal system: Secondary | ICD-10-CM

## 2017-09-26 DIAGNOSIS — M25511 Pain in right shoulder: Secondary | ICD-10-CM

## 2017-09-26 DIAGNOSIS — M25611 Stiffness of right shoulder, not elsewhere classified: Secondary | ICD-10-CM | POA: Diagnosis not present

## 2017-09-26 DIAGNOSIS — M25612 Stiffness of left shoulder, not elsewhere classified: Secondary | ICD-10-CM

## 2017-09-26 NOTE — Patient Instructions (Signed)

## 2017-09-26 NOTE — Therapy (Signed)
[place], [place], [address] Phone: [phone]   Fax:  [phone]  Physical Therapy Treatment  Patient Details  Name: Marie Castro MRN: [id number] Date of Birth: Jul 25, 1971 Referring Provider: Dr. [name]   Encounter Date: 09/26/2017  PT End of Session - 09/26/17 1520    Visit Number  2    Number of Visits  6    Date for PT Re-Evaluation  10/26/17    PT Start Time  1440    PT Stop Time  1520    PT Time Calculation (min)  40 min    Activity Tolerance  Patient tolerated treatment well    Behavior During Therapy  [facility] for tasks assessed/performed       Past Medical History:  Diagnosis Date  . Anxiety   . Cancer [facility])    breast  . Family history of breast cancer   . Family history of heart disease   . Family history of prostate cancer   . Hypothyroidism   . Thyroid disease     Past Surgical History:  Procedure Laterality Date  . BREAST BIOPSY Right 07/12/2017  . BREAST LUMPECTOMY WITH RADIOACTIVE SEED AND SENTINEL LYMPH NODE BIOPSY Right 08/16/2017   Procedure: RIGHT BREAST LUMPECTOMY WITH RADIOACTIVE SEED AND RIGHT SENTINEL LYMPH NODE BIOPSY;  Surgeon: [name], MD;  Location: [place];  Service: General;  Laterality: Right;  . BREAST LUMPECTOMY WITH RADIOACTIVE SEED LOCALIZATION Left 08/16/2017   Procedure: LEFT BREAST PARTIAL MASTECTOMY WITH BRACKETED RADIOACTIVE SEED LOCALIZATION;  Surgeon: [name], MD;  Location: [place];  Service: General;  Laterality: Left;  . DILATION AND CURETTAGE OF UTERUS    . KNEE SURGERY    . MULTIPLE TOOTH EXTRACTIONS    . RE-EXCISION OF BREAST LUMPECTOMY Left 08/27/2017   Procedure: LEFT RE-EXCISION OF BREAST LUMPECTOMY ERAS PATHWAY;  Surgeon: [name], MD;  Location: [place];  Service: General;  Laterality: Left;  . WISDOM TOOTH EXTRACTION      There were no vitals filed for this visit.  Subjective Assessment - 09/26/17  1443    Subjective  Nothing new since evaluation yesterday.    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  Right lumpectomy and partial mastectomy on left 08/16/17 and reexcision for clear margins on the left 08/27/17; sentinel nodes removed from right, and those were negative. Will start XRT 10/03/17 and will have about 33 treatments.  Is on tamoxifen, which she started before surgery.  Hypothyroid, situational anxiety. h/o right shoulder physical therapy due to pain, which included dry needling; tries to avoid positions and movements that cause pain.    Patient Stated Goals  be painfree, great ROM    Currently in Pain?  No/denies            LYMPHEDEMA/ONCOLOGY QUESTIONNAIRE - 09/25/17 1643      Lymphedema Assessments   Lymphedema Assessments  Upper extremities      Right Upper Extremity Lymphedema   10 cm Proximal to Olecranon Process  28.8 cm    Olecranon Process  23.7 cm    10 cm Proximal to Ulnar Styloid Process  20.7 cm    Just Proximal to Ulnar Styloid Process  14.5 cm    Across Hand at [employer]  18.6 cm    At [place] of 2nd Digit  6 cm      Left Upper Extremity  Lymphedema   10 cm Proximal to Olecranon Process  29.2 cm    Olecranon Process  24.1 cm    10 cm Proximal to Ulnar Styloid Process  20.4 cm    Just Proximal to Ulnar Styloid Process  14.8 cm    Across Hand at [employer]  18.6 cm    At [place] of 2nd Digit  5.8 cm               [facility] Adult PT Treatment/Exercise - 09/26/17 0001      Shoulder Exercises: Standing   External Rotation  Strengthening;Right;10 reps;Theraband some with band and some without    Theraband Level (Shoulder External Rotation)  Level 2 (Red)    Internal Rotation  Strengthening;Right;10 reps;Theraband    Theraband Level (Shoulder Internal Rotation)  Level 2 (Red)    Flexion  Strengthening;Right;10 reps;Theraband    Theraband Level (Shoulder  Flexion)  Level 2 (Red)    Extension  Strengthening;Right;10 reps;Theraband    Theraband Level (Shoulder Extension)  Level 2 (Red)      Manual Therapy   Manual Therapy  Myofascial release;Passive ROM;Soft tissue mobilization    Soft tissue mobilization  To Rt anterior joint capsule, cross friction massage    Myofascial Release  To Lt inferior breast where cording was palpated with arm in abduction with er stretch    Passive ROM  To Rt shoulder in supine into flexion, abduction and er to pts tolerance             PT Education - 09/26/17 1519    Education provided  Yes    Education Details  Rt UE [place] with red theraband    Person(s) Educated  Patient    Methods  Explanation;Demonstration;Handout    Comprehension  Verbalized understanding;Returned demonstration          PT Long Term Goals - 09/25/17 1723      PT LONG TERM GOAL #1   Title  Pt. will be independent in HEP for shoulder ROM.    Time  2    Period  Weeks    Status  New      PT LONG TERM GOAL #2   Title  Pt. will be knowledgeable about lymphedema risk reduction    Time  2    Period  Weeks    Status  New      PT LONG TERM GOAL #3   Title  Pt. will report at least 50% decreased pain inferior to left breast with raising left arm    Time  2    Period  Weeks    Status  New      PT LONG TERM GOAL #4   Title  Quick DASH score reduced to 10 or below    Baseline  20 on eval    Time  2    Period  Weeks            Plan - 09/26/17 1520    Clinical Impression Statement  Pt tolerated first session of manual therapy well. Instructed pt in [place] with red theraband which she tolerated well except some "twinge" with er so instructed pt to try without resistance prn, added this ti HEP. Pt tolerated P/ROM to Rt shoulder well with slow, long holds. During myofascial work to Lt inferior breast area cording was palpated and then educated pt about htis and focused myofascial release here with small releases felt  by therapist. Pt reported feeling good after session.  Rehab Potential  Excellent    Clinical Impairments Affecting Rehab Potential  h/o right shoulder pain, ongoing    PT Frequency  2x / week    PT Duration  2 weeks    PT Treatment/Interventions  ADLs/Self Care Home Management;Electrical Stimulation;DME Instruction;Therapeutic exercise;Patient/family education;Orthotic Fit/Training;Manual techniques;Scar mobilization;Passive range of motion    PT Next Visit Plan  Show HEP for shoulder flexion and ER stretches; cont myofascial release and scar mobilization for left inferior breast where cording was palpated today and right shoulder P/ROM; review prn [place] exercises for right shoulder; lymphedema risk reduction education, and go over in more detail about when to wear compression sleeve    Consulted and Agree with Plan of Care  Patient       Patient will benefit from skilled therapeutic intervention in order to improve the following deficits and impairments:  Decreased range of motion, Increased fascial restricitons, Pain, Decreased knowledge of precautions, Decreased knowledge of use of DME  Visit Diagnosis: Stiffness of right shoulder, not elsewhere classified  Right shoulder pain, unspecified chronicity  Stiffness of left shoulder, not elsewhere classified  Other symptoms and signs involving the musculoskeletal system     Problem List Patient Active Problem List   Diagnosis Date Noted  . Malignant neoplasm of upper-outer quadrant of left breast in female, estrogen receptor positive ([place]) 08/29/2017  . Genetic testing 07/31/2017  . Malignant neoplasm of upper-inner quadrant of right breast in female, estrogen receptor positive ([facility]) 07/24/2017  . Family history of breast cancer   . Family history of prostate cancer     Otelia Limes, PTA 09/26/2017, 3:24 PM  [place], [place],  [address] Phone: [phone]   Fax:  [phone]  Name: VALLA PACEY MRN: [id number] Date of Birth: 25-Jan-1971

## 2017-09-30 NOTE — Progress Notes (Signed)
  Radiation Oncology         [phone] ________________________________  Name: Marie Castro MRN: [id number]  Date: 09/24/2017  DOB: 1970-10-21  SIMULATION AND TREATMENT PLANNING NOTE    ICD-10-CM   1. Malignant neoplasm of upper-outer quadrant of left breast in female, estrogen receptor positive ([facility]) C50.412    Z17.0   2. 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  Identity was confirmed.  All relevant records and images related to the planned course of therapy were reviewed.  The patient freely provided informed written consent to proceed with treatment after reviewing the details related to the planned course of therapy. The consent form was witnessed and verified by the simulation staff.  Then, the patient was set-up in a stable reproducible  supine position for radiation therapy.  CT images were obtained.  Surface markings were placed.  The CT images were loaded into the planning software.  Then the target and avoidance structures were contoured.  Treatment planning then occurred.  The radiation prescription was entered and confirmed.  Then, I designed and supervised the construction of a total of 5 medically necessary complex treatment devices.  I have requested : 3D Simulation  I have requested a DVH of the following structures: Heart, lungs, lumpectomy cavities for both the left and right breast..  I have ordered:CBC  PLAN:  The patient will receive 50.4 Gy in 28 fractions directed at the left breast. The patient will then receive a boost to the left lumpectomy cavity of 10 gray in 5 fractions.  The right breast will receive 50.4 gray in 28 fractions. The patient will then  proceed with a boost to the right lumpectomy cavity of 12 gray in 6 fractions.    -----------------------------------  Blair Promise, PhD, MD

## 2017-10-01 ENCOUNTER — Ambulatory Visit: Payer: BC Managed Care – PPO | Admitting: Physical Therapy

## 2017-10-01 DIAGNOSIS — M25612 Stiffness of left shoulder, not elsewhere classified: Secondary | ICD-10-CM

## 2017-10-01 DIAGNOSIS — M25511 Pain in right shoulder: Secondary | ICD-10-CM

## 2017-10-01 DIAGNOSIS — M25611 Stiffness of right shoulder, not elsewhere classified: Secondary | ICD-10-CM | POA: Diagnosis not present

## 2017-10-01 DIAGNOSIS — R29898 Other symptoms and signs involving the musculoskeletal system: Secondary | ICD-10-CM

## 2017-10-01 NOTE — Patient Instructions (Addendum)
Flexors Stick Stretch I    Stand or sit, dowel in palm of arm to be stretched. Other arm, holding dowel at side and behind body, pushes arm being stretched forward and upward until straight over head. Hold __10_ seconds. Repeat _5__ times per session. Do _1-2__ sessions per day.  Copyright  VHI. All rights reserved.    External Rotator Cuff Stretch, Standing    Stand, palm against door frame and elbow bent at 90. Turn body away from fixed hand allowing shoulder to come forward. Hold __15_ seconds.  Repeat __3_ times per session. Do _1-2__ sessions per day.  ONLY DO IN PAINFREE RANGE.  Copyright  VHI. All rights reserved.

## 2017-10-01 NOTE — Therapy (Signed)
[place], [place], [address] Phone: [phone]   Fax:  [phone]  Physical Therapy Treatment  Patient Details  Name: Marie Castro MRN: [id number] Date of Birth: Jan 30, 1971 Referring Provider: Dr. [name]   Encounter Date: 10/01/2017  PT End of Session - 10/01/17 1308    Visit Number  3    Number of Visits  11    Date for PT Re-Evaluation  10/31/17    PT Start Time  0802    PT Stop Time  0848    PT Time Calculation (min)  46 min    Activity Tolerance  Patient tolerated treatment well    Behavior During Therapy  [facility] for tasks assessed/performed       Past Medical History:  Diagnosis Date  . Anxiety   . Cancer [facility])    breast  . Family history of breast cancer   . Family history of heart disease   . Family history of prostate cancer   . Hypothyroidism   . Thyroid disease     Past Surgical History:  Procedure Laterality Date  . BREAST BIOPSY Right 07/12/2017  . BREAST LUMPECTOMY WITH RADIOACTIVE SEED AND SENTINEL LYMPH NODE BIOPSY Right 08/16/2017   Procedure: RIGHT BREAST LUMPECTOMY WITH RADIOACTIVE SEED AND RIGHT SENTINEL LYMPH NODE BIOPSY;  Surgeon: [name], MD;  Location: [place];  Service: General;  Laterality: Right;  . BREAST LUMPECTOMY WITH RADIOACTIVE SEED LOCALIZATION Left 08/16/2017   Procedure: LEFT BREAST PARTIAL MASTECTOMY WITH BRACKETED RADIOACTIVE SEED LOCALIZATION;  Surgeon: [name], MD;  Location: [place];  Service: General;  Laterality: Left;  . DILATION AND CURETTAGE OF UTERUS    . KNEE SURGERY    . MULTIPLE TOOTH EXTRACTIONS    . RE-EXCISION OF BREAST LUMPECTOMY Left 08/27/2017   Procedure: LEFT RE-EXCISION OF BREAST LUMPECTOMY ERAS PATHWAY;  Surgeon: [name], MD;  Location: [place];  Service: General;  Laterality: Left;  . WISDOM TOOTH EXTRACTION      There were no vitals filed for this visit.  Subjective Assessment - 10/01/17  0804    Subjective  "I saw the surgeon on Friday and the thing that Val saw and thought was cording, the PA agreed with.  I did feel that it got a little bit better with the treatment." Feels confident about [place] exercise and is doing them only in painfree range.    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  Right lumpectomy and partial mastectomy on left 08/16/17 and reexcision for clear margins on the left 08/27/17; sentinel nodes removed from right, and those were negative. Will start XRT 10/03/17 and will have about 33 treatments.  Is on tamoxifen, which she started before surgery.  Hypothyroid, situational anxiety. h/o right shoulder physical therapy due to pain, which included dry needling; tries to avoid positions and movements that cause pain.    Currently in Pain?  Yes    Pain Score  2     Pain Location  Breast    Pain Orientation  Left;Lower    Pain Descriptors / Indicators  Other (Comment) a nuisance    Aggravating Factors   with movement; also where a bra hits it    Pain Relieving Factors  nothing         [facility] PT Assessment - 10/01/17 0001      Palpation   Palpation comment  Pt.  is not particularly tender at subacromial space, but is tender at right biceps tendon; however, resisted elbow flexion does not cause pain, whereas right shoulder ir and er do.                  [place] Adult PT Treatment/Exercise - 10/01/17 0001      Exercises   Exercises  Other Exercises    Other Exercises   tried cobra pose, then child's pose with lateral flexion to try to stretch cord      Shoulder Exercises: Standing   External Rotation  AAROM;Right;Left doorway, unilateral, 15 seconds x 3 each side, painfree    Flexion  AAROM;Right;Left;5 reps with dowel for stretch, 10 second holds    Other Standing Exercises  couldn't do bilateral doorway stretch due to right shoulder pain      Manual  Therapy   Soft tissue mobilization  to left cording area inferior to breast one small and one larger "pop" felt    Myofascial Release  right UE myofascial pulling with movement into abduction; crosshands at left with arm in flexion and stretch down to abdomen (across cord) one small "pop" felt    Passive ROM  To Rt and Lt. shoulder in supine into flexion, abduction and er to pts tolerance             PT Education - 10/01/17 1308    Education provided  Yes    Education Details  try cobra pose to see if that helps stretch cording; standing dowel exercise for shoulder flexion, unilateral doorway stretch for shoulder er    Person(s) Educated  Patient    Methods  Explanation;Verbal cues;Demonstration;Handout    Comprehension  Verbalized understanding;Returned demonstration          PT Long Term Goals - 09/25/17 1723      PT LONG TERM GOAL #1   Title  Pt. will be independent in HEP for shoulder ROM.    Time  2    Period  Weeks    Status  New      PT LONG TERM GOAL #2   Title  Pt. will be knowledgeable about lymphedema risk reduction    Time  2    Period  Weeks    Status  New      PT LONG TERM GOAL #3   Title  Pt. will report at least 50% decreased pain inferior to left breast with raising left arm    Time  2    Period  Weeks    Status  New      PT LONG TERM GOAL #4   Title  Quick DASH score reduced to 10 or below    Baseline  20 on eval    Time  2    Period  Weeks            Plan - 10/01/17 1309    Clinical Impression Statement  Pt. is doing fairly well, but right shoulder remains limited from old injury.  She does have localized tenderness, but at biceps tendon, while she is not tender with resisted elbow flexion but rather with shoulder er and ir, a puzzling combination.  Some release obtained in cording today.  She felt sore immediately after this.      Rehab Potential  Excellent    Clinical Impairments Affecting Rehab Potential  h/o right shoulder pain,  ongoing    PT Frequency  2x / week    PT Duration  4  weeks    PT Treatment/Interventions  ADLs/Self Care Home Management;Electrical Stimulation;DME Instruction;Therapeutic exercise;Patient/family education;Orthotic Fit/Training;Manual techniques;Scar mobilization;Passive range of motion;Iontophoresis 4mg /ml Dexamethasone    PT Next Visit Plan  continue myofascial release and soft tissue mobilization inferior to left breast for cording; bilat. shoulder ROM, lymphedema risk reduction and compression sleeve wear; iontophoresis to right biceps tendon area of tenderness if okayed by MD    Consulted and Agree with Plan of Care  Patient       Patient will benefit from skilled therapeutic intervention in order to improve the following deficits and impairments:  Decreased range of motion, Increased fascial restricitons, Pain, Decreased knowledge of precautions, Decreased knowledge of use of DME  Visit Diagnosis: Stiffness of right shoulder, not elsewhere classified - Plan: PT plan of care cert/re-cert  Right shoulder pain, unspecified chronicity - Plan: PT plan of care cert/re-cert  Stiffness of left shoulder, not elsewhere classified - Plan: PT plan of care cert/re-cert  Other symptoms and signs involving the musculoskeletal system - Plan: PT plan of care cert/re-cert     Problem List Patient Active Problem List   Diagnosis Date Noted  . Malignant neoplasm of upper-outer quadrant of left breast in female, estrogen receptor positive ([place]) 08/29/2017  . Genetic testing 07/31/2017  . Malignant neoplasm of upper-inner quadrant of right breast in female, estrogen receptor positive ([place]) 07/24/2017  . Family history of breast cancer   . Family history of prostate cancer     Royetta Probus 10/01/2017, 1:17 PM  [place], [place], [address] Phone: [phone]   Fax:  [phone]  Name: Marie Castro MRN:  [id number] Date of Birth: [birth year]  Serafina Royals, PT 10/01/17 1:17 PM

## 2017-10-03 ENCOUNTER — Ambulatory Visit
Admission: RE | Admit: 2017-10-03 | Discharge: 2017-10-03 | Disposition: A | Discharge status: Home / Self Care | Payer: BC Managed Care – PPO | Source: Ambulatory Visit | Attending: Radiation Oncology | Admitting: Radiation Oncology

## 2017-10-03 DIAGNOSIS — Z510 Encounter for antineoplastic radiation therapy: Secondary | ICD-10-CM | POA: Diagnosis not present

## 2017-10-04 ENCOUNTER — Ambulatory Visit
Admission: RE | Admit: 2017-10-04 | Discharge: 2017-10-04 | Disposition: A | Discharge status: Home / Self Care | Payer: BC Managed Care – PPO | Source: Ambulatory Visit | Attending: Radiation Oncology | Admitting: Radiation Oncology

## 2017-10-04 DIAGNOSIS — Z510 Encounter for antineoplastic radiation therapy: Secondary | ICD-10-CM | POA: Diagnosis not present

## 2017-10-05 ENCOUNTER — Ambulatory Visit: Payer: BC Managed Care – PPO | Admitting: Physical Therapy

## 2017-10-05 ENCOUNTER — Ambulatory Visit
Admission: RE | Admit: 2017-10-05 | Discharge: 2017-10-05 | Disposition: A | Discharge status: Home / Self Care | Payer: BC Managed Care – PPO | Source: Ambulatory Visit | Attending: Radiation Oncology | Admitting: Radiation Oncology

## 2017-10-05 DIAGNOSIS — M25612 Stiffness of left shoulder, not elsewhere classified: Secondary | ICD-10-CM

## 2017-10-05 DIAGNOSIS — M25511 Pain in right shoulder: Secondary | ICD-10-CM

## 2017-10-05 DIAGNOSIS — Z510 Encounter for antineoplastic radiation therapy: Secondary | ICD-10-CM | POA: Diagnosis not present

## 2017-10-05 DIAGNOSIS — M25611 Stiffness of right shoulder, not elsewhere classified: Secondary | ICD-10-CM

## 2017-10-05 DIAGNOSIS — R29898 Other symptoms and signs involving the musculoskeletal system: Secondary | ICD-10-CM

## 2017-10-05 NOTE — Therapy (Signed)
[place], [place], [address] Phone: [phone]   Fax:  [phone]  Physical Therapy Treatment  Patient Details  Name: Marie Castro MRN: [id number] Date of Birth: Nov 18, 1970 Referring Provider: Dr. [name]   Encounter Date: 10/05/2017  PT End of Session - 10/05/17 1159    Visit Number  4    Number of Visits  11    Date for PT Re-Evaluation  10/31/17    PT Start Time  6962    PT Stop Time  1149    PT Time Calculation (min)  62 min    Activity Tolerance  Patient tolerated treatment well    Behavior During Therapy  [facility] for tasks assessed/performed       Past Medical History:  Diagnosis Date  . Anxiety   . Cancer [facility])    breast  . Family history of breast cancer   . Family history of heart disease   . Family history of prostate cancer   . Hypothyroidism   . Thyroid disease     Past Surgical History:  Procedure Laterality Date  . BREAST BIOPSY Right 07/12/2017  . BREAST LUMPECTOMY WITH RADIOACTIVE SEED AND SENTINEL LYMPH NODE BIOPSY Right 08/16/2017   Procedure: RIGHT BREAST LUMPECTOMY WITH RADIOACTIVE SEED AND RIGHT SENTINEL LYMPH NODE BIOPSY;  Surgeon: [name], MD;  Location: [place];  Service: General;  Laterality: Right;  . BREAST LUMPECTOMY WITH RADIOACTIVE SEED LOCALIZATION Left 08/16/2017   Procedure: LEFT BREAST PARTIAL MASTECTOMY WITH BRACKETED RADIOACTIVE SEED LOCALIZATION;  Surgeon: [name], MD;  Location: [place];  Service: General;  Laterality: Left;  . DILATION AND CURETTAGE OF UTERUS    . KNEE SURGERY    . MULTIPLE TOOTH EXTRACTIONS    . RE-EXCISION OF BREAST LUMPECTOMY Left 08/27/2017   Procedure: LEFT RE-EXCISION OF BREAST LUMPECTOMY ERAS PATHWAY;  Surgeon: [name], MD;  Location: [place];  Service: General;  Laterality: Left;  . WISDOM TOOTH EXTRACTION      There were no vitals filed for this visit.  Subjective Assessment - 10/05/17  1048    Subjective  Things feel about the same. Was a little sore at end of session last time but it didn't last.    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  Right lumpectomy and partial mastectomy on left 08/16/17 and reexcision for clear margins on the left 08/27/17; sentinel nodes removed from right, and those were negative. Will start XRT 10/03/17 and will have about 33 treatments.  Is on tamoxifen, which she started before surgery.  Hypothyroid, situational anxiety. h/o right shoulder physical therapy due to pain, which included dry needling; tries to avoid positions and movements that cause pain.    Currently in Pain?  Yes    Pain Score  4     Pain Location  Shoulder    Pain Orientation  Right    Pain Descriptors / Indicators  Aching annoying    Aggravating Factors   movements such as reaching back    Pain Relieving Factors  nothing                      [facility] Adult PT Treatment/Exercise - 10/05/17 0001      Manual Therapy   Manual Therapy  Taping;Other (comment);Neural Stretch    Soft tissue mobilization  to left cording area inferior to breast  Myofascial Release  unidirectional release at right upper arm pulling out above axilla; crosshands on left from upper arm to left abdomen (but stopped when pain at axilla); crosshands from inferior aspect of left breast to left abdomen 1 larger and 3 smaller "pops" felt: cording releases    Passive ROM  Right shoulder stretch into horizontal abduction; then into er, abduction and flexion to tolerance, using traction through most of that and trying to avoid joint pain; left shoulder stretch to er, abduction and flexion to tolerance with some traction    Other Manual Therapy  cross friction massage to right biceps tendon area of tenderness    Neural Stretch  right UE    McConnell  horizontal strip placed at posterior shoulder area from right  upper arm to upper back, placed with pt. holding arm in er and with tension on the tape.             PT Education - 10/05/17 1159    Education provided  Yes    Education Details  could trying lying back over physioball to stretch, as long as it does not increase pain    Person(s) Educated  Patient    Methods  Explanation    Comprehension  Verbalized understanding          PT Long Term Goals - 10/05/17 1051      PT LONG TERM GOAL #1   Title  Pt. will be independent in HEP for shoulder ROM.    Status  Partially Met      PT LONG TERM GOAL #2   Title  Pt. will be knowledgeable about lymphedema risk reduction    Status  On-going      PT LONG TERM GOAL #3   Title  Pt. will report at least 50% decreased pain inferior to left breast with raising left arm    Baseline  50% better as of 10/05/17    Status  Achieved      PT LONG TERM GOAL #4   Title  Quick DASH score reduced to 10 or below    Status  On-going            Plan - 10/05/17 1159    Clinical Impression Statement  Pt. is making progress.  Goal for at least 50% reduction in left tightness (of cording) just inferior to breast met today. Achieved four releases of cording with palpable pops today. Tried McConnell taping to limit right shoulder internal rotation, as that aggravates her right shoulder pain.  Unable to try iontophoresis today (though MD signed order) because she will go for XRT in just over an hour.    Rehab Potential  Excellent    Clinical Impairments Affecting Rehab Potential  h/o right shoulder pain, ongoing    PT Frequency  2x / week    PT Duration  4 weeks    PT Treatment/Interventions  ADLs/Self Care Home Management;Electrical Stimulation;DME Instruction;Therapeutic exercise;Patient/family education;Orthotic Fit/Training;Manual techniques;Scar mobilization;Passive range of motion;Iontophoresis 25m/ml Dexamethasone    PT Next Visit Plan  Go over lymphedema risk reduction practices including  compression sleeve wear; continue myofascial release and soft tissue mobilization inferior to left breast for cording; bilat. shoulder ROM; iontophoresis to right biceps tendon area of tenderness if she is here for therapy AFTER having had her XRT that day    Consulted and Agree with Plan of Care  Patient       Patient will benefit from skilled therapeutic intervention in order to improve the  following deficits and impairments:  Decreased range of motion, Increased fascial restricitons, Pain, Decreased knowledge of precautions, Decreased knowledge of use of DME  Visit Diagnosis: Stiffness of right shoulder, not elsewhere classified  Right shoulder pain, unspecified chronicity  Stiffness of left shoulder, not elsewhere classified  Other symptoms and signs involving the musculoskeletal system     Problem List Patient Active Problem List   Diagnosis Date Noted  . Malignant neoplasm of upper-outer quadrant of left breast in female, estrogen receptor positive ([place]) 08/29/2017  . Genetic testing 07/31/2017  . Malignant neoplasm of upper-inner quadrant of right breast in female, estrogen receptor positive ([place]) 07/24/2017  . Family history of breast cancer   . Family history of prostate cancer     Chrystie Hagwood 10/05/2017, 12:04 PM  [place], [place], [address] Phone: [phone]   Fax:  [phone]  Name: Marie Castro MRN: [id number] Date of Birth: 1971-07-15  Serafina Royals, PT 10/05/17 12:04 PM

## 2017-10-08 ENCOUNTER — Ambulatory Visit
Admission: RE | Admit: 2017-10-08 | Discharge: 2017-10-08 | Disposition: A | Discharge status: Home / Self Care | Payer: BC Managed Care – PPO | Source: Ambulatory Visit | Attending: Radiation Oncology | Admitting: Radiation Oncology

## 2017-10-08 DIAGNOSIS — Z170 Estrogen receptor positive status [ER+]: Principal | ICD-10-CM

## 2017-10-08 DIAGNOSIS — Z510 Encounter for antineoplastic radiation therapy: Secondary | ICD-10-CM | POA: Diagnosis not present

## 2017-10-08 DIAGNOSIS — M25611 Stiffness of right shoulder, not elsewhere classified: Secondary | ICD-10-CM | POA: Diagnosis not present

## 2017-10-08 DIAGNOSIS — C50412 Malignant neoplasm of upper-outer quadrant of left female breast: Secondary | ICD-10-CM

## 2017-10-08 MED ORDER — RADIAPLEXRX EX GEL
Freq: Two times a day (BID) | CUTANEOUS | Status: DC
  Administered 2017-10-08: 17:00:00 via TOPICAL

## 2017-10-08 MED ORDER — ALRA NON-METALLIC DEODORANT (RAD-ONC)
1.0000 "application " | Freq: Once | TOPICAL | Status: AC
  Administered 2017-10-08: 1 via TOPICAL

## 2017-10-08 NOTE — Progress Notes (Signed)
Pt here for patient teaching.  Pt given Radiation and You booklet, skin care instructions, Alra deodorant and Radiaplex gel.  Reviewed areas of pertinence such as fatigue, skin changes, breast tenderness and breast swelling . Pt able to give teach back of use baby wipes, have Imodium on hand, drink plenty of water and sitz bath,apply Radiaplex bid, avoid applying anything to skin within 4 hours of treatment, avoid wearing an under wire bra and to use an electric razor if they must shave. Pt verbalizes understanding of information given and will contact nursing with any questions or concerns.     Http://rtanswers.org/treatmentinformation/whattoexpect/index

## 2017-10-10 ENCOUNTER — Telehealth: Payer: Self-pay

## 2017-10-10 ENCOUNTER — Ambulatory Visit
Admission: RE | Admit: 2017-10-10 | Discharge: 2017-10-10 | Disposition: A | Discharge status: Home / Self Care | Payer: BC Managed Care – PPO | Source: Ambulatory Visit | Attending: Radiation Oncology | Admitting: Radiation Oncology

## 2017-10-10 ENCOUNTER — Ambulatory Visit: Payer: BC Managed Care – PPO | Attending: General Surgery

## 2017-10-10 DIAGNOSIS — M25611 Stiffness of right shoulder, not elsewhere classified: Secondary | ICD-10-CM

## 2017-10-10 DIAGNOSIS — M25511 Pain in right shoulder: Secondary | ICD-10-CM

## 2017-10-10 DIAGNOSIS — M25612 Stiffness of left shoulder, not elsewhere classified: Secondary | ICD-10-CM | POA: Insufficient documentation

## 2017-10-10 DIAGNOSIS — Z510 Encounter for antineoplastic radiation therapy: Secondary | ICD-10-CM | POA: Diagnosis not present

## 2017-10-10 DIAGNOSIS — R29898 Other symptoms and signs involving the musculoskeletal system: Secondary | ICD-10-CM | POA: Diagnosis present

## 2017-10-10 NOTE — Telephone Encounter (Signed)
Pt called to ask about her missed period in December. Pt was concerned that this happened. Wanted to know if this was coming from tamoxifen. She started to take tamoxifen 2 months ago prior to her surgery.  Pt complains that since not having a period in December, pt started to feel nauseous and has some pelvic discomfort. She is not sure if its coming from tamoxifen. Pt is currently using condoms for contraception at this time and no oral birth control use. She does have an appt with her gynecologist this month. Suggested that pt take a pregnancy test to make sure that she is not pregnant. Pt verbalized understanding.  Told pt that tamoxifen may cause some nausea, pelvic pressure and discomfort. It can also cause period irregularities. Told pt that she may cease having a period for months, or she may get a longer or irregular bleeding. Pt verbalized understanding and will make sure she discuss this with her gynecologist. Told pt that Dr.Magrinat suggest that IUD merena will be best while on tamoxifen to have as a contraceptive. Pt will keep this in mind when she goes for her GYN appt. No further needs at this time.

## 2017-10-10 NOTE — Therapy (Signed)
[place], [place], [address] Phone: [phone]   Fax:  [phone]  Physical Therapy Treatment  Patient Details  Name: Marie Castro MRN: [id number] Date of Birth: 09/30/1971 Referring Provider: Dr. [name]   Encounter Date: 10/10/2017  PT End of Session - 10/10/17 1439    Visit Number  5    Number of Visits  11    Date for PT Re-Evaluation  10/31/17    PT Start Time  3403    PT Stop Time  1435    PT Time Calculation (min)  41 min    Activity Tolerance  Patient tolerated treatment well    Behavior During Therapy  [facility] for tasks assessed/performed       Past Medical History:  Diagnosis Date  . Anxiety   . Cancer [facility])    breast  . Family history of breast cancer   . Family history of heart disease   . Family history of prostate cancer   . Hypothyroidism   . Thyroid disease     Past Surgical History:  Procedure Laterality Date  . BREAST BIOPSY Right 07/12/2017  . BREAST LUMPECTOMY WITH RADIOACTIVE SEED AND SENTINEL LYMPH NODE BIOPSY Right 08/16/2017   Procedure: RIGHT BREAST LUMPECTOMY WITH RADIOACTIVE SEED AND RIGHT SENTINEL LYMPH NODE BIOPSY;  Surgeon: [name], MD;  Location: [place];  Service: General;  Laterality: Right;  . BREAST LUMPECTOMY WITH RADIOACTIVE SEED LOCALIZATION Left 08/16/2017   Procedure: LEFT BREAST PARTIAL MASTECTOMY WITH BRACKETED RADIOACTIVE SEED LOCALIZATION;  Surgeon: [name], MD;  Location: [place];  Service: General;  Laterality: Left;  . DILATION AND CURETTAGE OF UTERUS    . KNEE SURGERY    . MULTIPLE TOOTH EXTRACTIONS    . RE-EXCISION OF BREAST LUMPECTOMY Left 08/27/2017   Procedure: LEFT RE-EXCISION OF BREAST LUMPECTOMY ERAS PATHWAY;  Surgeon: [name], MD;  Location: [place];  Service: General;  Laterality: Left;  . WISDOM TOOTH EXTRACTION      There were no vitals filed for this visit.  Subjective Assessment - 10/10/17  1357    Subjective  The tape seemed to help but it didn't last very long, maybe we could try kinesiotape this time. Nothing elsenew since I was last.     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  Right lumpectomy and partial mastectomy on left 08/16/17 and reexcision for clear margins on the left 08/27/17; sentinel nodes removed from right, and those were negative. Will start XRT 10/03/17 and will have about 33 treatments.  Is on tamoxifen, which she started before surgery.  Hypothyroid, situational anxiety. h/o right shoulder physical therapy due to pain, which included dry needling; tries to avoid positions and movements that cause pain.    Patient Stated Goals  be painfree, great ROM    Currently in Pain?  No/denies                      [facility] Adult PT Treatment/Exercise - 10/10/17 0001      Manual Therapy   Manual Therapy  Taping    Soft tissue mobilization  to left cording area inferior to breast, though this done briefly today as pt or therapist could not palpate cording.    Myofascial Release  unidirectional release at right upper arm pulling out above axilla; crosshands from inferior aspect of left breast to left abdomen  briefly    Passive ROM  Right shoulder stretch into horizontal abduction; then into er, abduction and flexion to tolerance, using traction through most of that and trying to avoid joint pain; left shoulder stretch to er, abduction and flexion to tolerance with some traction.    Other Manual Therapy  cross friction massage to right biceps tendon area of tenderness    Neural Stretch  --    Kinesiotex  Inhibit Muscle      Kinesiotix   Inhibit Muscle   horizontal strip placed to posterior right shoulder from upper arm to upper back with pt holding arm held in er and with tension on the tape.                  PT Long Term Goals - 10/05/17 1051      PT LONG  TERM GOAL #1   Title  Pt. will be independent in HEP for shoulder ROM.    Status  Partially Met      PT LONG TERM GOAL #2   Title  Pt. will be knowledgeable about lymphedema risk reduction    Status  On-going      PT LONG TERM GOAL #3   Title  Pt. will report at least 50% decreased pain inferior to left breast with raising left arm    Baseline  50% better as of 10/05/17    Status  Achieved      PT LONG TERM GOAL #4   Title  Quick DASH score reduced to 10 or below    Status  On-going            Plan - 10/10/17 1440    Clinical Impression Statement  Continued with taping but tried kinesiotape instead today as pt reported Mcconnell didn't stay on long. Her tightness is much improved at area of cording (unable to palpate this today) at Lt inferior breast.     Rehab Potential  Excellent    Clinical Impairments Affecting Rehab Potential  h/o right shoulder pain, ongoing    PT Frequency  2x / week    PT Duration  4 weeks    PT Treatment/Interventions  ADLs/Self Care Home Management;Electrical Stimulation;DME Instruction;Therapeutic exercise;Patient/family education;Orthotic Fit/Training;Manual techniques;Scar mobilization;Passive range of motion;Iontophoresis 110m/ml Dexamethasone    PT Next Visit Plan  Go over lymphedema risk reduction practices including compression sleeve wear (issued handout today and began instruction of this); continue myofascial release and soft tissue mobilization inferior to left breast for cording; bilat. shoulder ROM; iontophoresis to right biceps tendon area of tenderness if she is here for therapy AFTER having had her XRT that day    Consulted and Agree with Plan of Care  Patient       Patient will benefit from skilled therapeutic intervention in order to improve the following deficits and impairments:  Decreased range of motion, Increased fascial restricitons, Pain, Decreased knowledge of precautions, Decreased knowledge of use of DME  Visit  Diagnosis: Stiffness of right shoulder, not elsewhere classified  Right shoulder pain, unspecified chronicity  Stiffness of left shoulder, not elsewhere classified  Other symptoms and signs involving the musculoskeletal system     Problem List Patient Active Problem List   Diagnosis Date Noted  . Malignant neoplasm of upper-outer quadrant of left breast in female, estrogen receptor positive ([place] 08/29/2017  . Genetic testing 07/31/2017  . Malignant neoplasm of upper-inner quadrant of right breast in [place], [place] 07/24/2017  . Family history of breast cancer   .  Family history of prostate cancer     Otelia Limes, PTA 10/10/2017, 5:21 PM  [place], [place], [address] Phone: [phone]   Fax:  [phone]  Name: TRUDE CANSLER MRN: [id number] Date of Birth: July 24, 1971

## 2017-10-11 ENCOUNTER — Ambulatory Visit
Admission: RE | Admit: 2017-10-11 | Discharge: 2017-10-11 | Disposition: A | Discharge status: Home / Self Care | Payer: BC Managed Care – PPO | Source: Ambulatory Visit | Attending: Radiation Oncology | Admitting: Radiation Oncology

## 2017-10-11 DIAGNOSIS — Z510 Encounter for antineoplastic radiation therapy: Secondary | ICD-10-CM | POA: Diagnosis not present

## 2017-10-12 ENCOUNTER — Ambulatory Visit
Admission: RE | Admit: 2017-10-12 | Discharge: 2017-10-12 | Disposition: A | Discharge status: Home / Self Care | Payer: BC Managed Care – PPO | Source: Ambulatory Visit | Attending: Radiation Oncology | Admitting: Radiation Oncology

## 2017-10-12 ENCOUNTER — Encounter: Payer: BC Managed Care – PPO | Admitting: Physical Therapy

## 2017-10-12 DIAGNOSIS — Z510 Encounter for antineoplastic radiation therapy: Secondary | ICD-10-CM | POA: Diagnosis not present

## 2017-10-15 ENCOUNTER — Ambulatory Visit
Admission: RE | Admit: 2017-10-15 | Discharge: 2017-10-15 | Disposition: A | Discharge status: Home / Self Care | Payer: BC Managed Care – PPO | Source: Ambulatory Visit | Attending: Radiation Oncology | Admitting: Radiation Oncology

## 2017-10-15 DIAGNOSIS — Z510 Encounter for antineoplastic radiation therapy: Secondary | ICD-10-CM | POA: Diagnosis not present

## 2017-10-16 ENCOUNTER — Ambulatory Visit
Admission: RE | Admit: 2017-10-16 | Discharge: 2017-10-16 | Disposition: A | Discharge status: Home / Self Care | Payer: BC Managed Care – PPO | Source: Ambulatory Visit | Attending: Radiation Oncology | Admitting: Radiation Oncology

## 2017-10-16 ENCOUNTER — Encounter: Payer: BC Managed Care – PPO | Admitting: Physical Therapy

## 2017-10-16 DIAGNOSIS — Z510 Encounter for antineoplastic radiation therapy: Secondary | ICD-10-CM | POA: Diagnosis not present

## 2017-10-17 ENCOUNTER — Ambulatory Visit
Admission: RE | Admit: 2017-10-17 | Discharge: 2017-10-17 | Disposition: A | Discharge status: Home / Self Care | Payer: BC Managed Care – PPO | Source: Ambulatory Visit | Attending: Radiation Oncology | Admitting: Radiation Oncology

## 2017-10-17 ENCOUNTER — Ambulatory Visit: Payer: BC Managed Care – PPO | Admitting: Physical Therapy

## 2017-10-17 DIAGNOSIS — M25611 Stiffness of right shoulder, not elsewhere classified: Secondary | ICD-10-CM

## 2017-10-17 DIAGNOSIS — M25511 Pain in right shoulder: Secondary | ICD-10-CM

## 2017-10-17 DIAGNOSIS — Z510 Encounter for antineoplastic radiation therapy: Secondary | ICD-10-CM | POA: Diagnosis not present

## 2017-10-17 DIAGNOSIS — R29898 Other symptoms and signs involving the musculoskeletal system: Secondary | ICD-10-CM

## 2017-10-17 DIAGNOSIS — M25612 Stiffness of left shoulder, not elsewhere classified: Secondary | ICD-10-CM

## 2017-10-17 NOTE — Therapy (Signed)
[place], [place], [address] Phone: [phone]   Fax:  [phone]  Physical Therapy Treatment  Patient Details  Name: Marie Castro MRN: [id number] Date of Birth: [birth year] Referring Provider: Dr. [name]   Encounter Date: 10/17/2017  PT End of Session - 10/17/17 2038    Visit Number  6    Number of Visits  11    Date for PT Re-Evaluation  10/31/17    PT Start Time  4825    PT Stop Time  1705    PT Time Calculation (min)  50 min    Activity Tolerance  Patient tolerated treatment well    Behavior During Therapy  [facility] for tasks assessed/performed       Past Medical History:  Diagnosis Date  . Anxiety   . Cancer [facility])    breast  . Family history of breast cancer   . Family history of heart disease   . Family history of prostate cancer   . Hypothyroidism   . Thyroid disease     Past Surgical History:  Procedure Laterality Date  . BREAST BIOPSY Right 07/12/2017  . BREAST LUMPECTOMY WITH RADIOACTIVE SEED AND SENTINEL LYMPH NODE BIOPSY Right 08/16/2017   Procedure: RIGHT BREAST LUMPECTOMY WITH RADIOACTIVE SEED AND RIGHT SENTINEL LYMPH NODE BIOPSY;  Surgeon: [name], MD;  Location: [place];  Service: General;  Laterality: Right;  . BREAST LUMPECTOMY WITH RADIOACTIVE SEED LOCALIZATION Left 08/16/2017   Procedure: LEFT BREAST PARTIAL MASTECTOMY WITH BRACKETED RADIOACTIVE SEED LOCALIZATION;  Surgeon: [name], MD;  Location: [place];  Service: General;  Laterality: Left;  . DILATION AND CURETTAGE OF UTERUS    . KNEE SURGERY    . MULTIPLE TOOTH EXTRACTIONS    . RE-EXCISION OF BREAST LUMPECTOMY Left 08/27/2017   Procedure: LEFT RE-EXCISION OF BREAST LUMPECTOMY ERAS PATHWAY;  Surgeon: [name], MD;  Location: [place];  Service: General;  Laterality: Left;  . WISDOM TOOTH EXTRACTION      There were no vitals filed for this visit.  Subjective Assessment - 10/17/17  1617    Subjective  The kinesiotape stayed on a good while; I felt the other tape more, but it didn't stay on past the next day.  Starting to get some redness from radiation.  I took a zumba class lat night and I was exhausted; felt some tightness at right upper flank with reaching up. Still isn't feeling the cording under left breast.    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  Right lumpectomy and partial mastectomy on left 08/16/17 and reexcision for clear margins on the left 08/27/17; sentinel nodes removed from right, and those were negative. Will start XRT 10/03/17 and will have about 33 treatments.  Is on tamoxifen, which she started before surgery.  Hypothyroid, situational anxiety. h/o right shoulder physical therapy due to pain, which included dry needling; tries to avoid positions and movements that cause pain.    Currently in Pain?  No/denies               Katina Dung - 10/17/17 0001    Open a tight or new jar  No difficulty    Do heavy household chores (wash walls, wash floors)  Mild difficulty    Carry a shopping bag or briefcase  No difficulty    Wash your back  Mild difficulty    Use  a knife to cut food  No difficulty    Recreational activities in which you take some force or impact through your arm, shoulder, or hand (golf, hammering, tennis)  Moderate difficulty    During the past week, to what extent has your arm, shoulder or hand problem interfered with your normal social activities with family, friends, neighbors, or groups?  Not at all    During the past week, to what extent has your arm, shoulder or hand problem limited your work or other regular daily activities  Modererately    Arm, shoulder, or hand pain.  Moderate    Tingling (pins and needles) in your arm, shoulder, or hand  None    Difficulty Sleeping  Mild difficulty    DASH Score  20.45 %            [facility] Adult  PT Treatment/Exercise - 10/17/17 0001      Self-Care   Self-Care  Other Self-Care Comments    Other Self-Care Comments   Educated about lymphedema risk reduction practices  and how/where to obtain a compression sleeve.      Modalities   Modalities  Iontophoresis      Iontophoresis   Type of Iontophoresis  Dexamethasone    Location  right biceps tendon area of tenderness    Dose  1 ml    Time  6 hours in STAT patch      Manual Therapy   Myofascial Release  unidirectional release at right upper arm pulling out above axiilla    Passive ROM  to right and left shoulder into er, abduction and flexion; traction with er and abduction    Other Manual Therapy  cross friction massage to right biceps tendon area of tenderness    Kinesiotex  Inhibit Muscle horizontal at posterior Rt. shoulder in er             PT Education - 10/17/17 2037    Education provided  Yes    Education Details  lymphedema risk reduction; iontophoresis wear time and precautions    Person(s) Educated  Patient    Methods  Explanation;Handout    Comprehension  Verbalized understanding          PT Long Term Goals - 10/17/17 2041      PT LONG TERM GOAL #1   Title  Pt. will be independent in HEP for shoulder ROM.    Status  Partially Met      PT LONG TERM GOAL #2   Title  Pt. will be knowledgeable about lymphedema risk reduction    Status  Achieved      PT LONG TERM GOAL #3   Title  Pt. will report at least 50% decreased pain inferior to left breast with raising left arm    Status  Achieved      PT LONG TERM GOAL #4   Title  Quick DASH score reduced to 10 or below    Baseline  20 on eval and on 10/17/17, though she reports she does note improvement    Status  On-going            Plan - 10/17/17 2038    Clinical Impression Statement  Added iontophoresis to right biceps tendon area of tenderness today.  Continued shoulder ROM and completed lymphedema risk reduction education, including how and  where to obtain a compression garment. Quick DASH score is the same as on eval, though patient reports she has improved, so that goal not yet  met.    Rehab Potential  Excellent    Clinical Impairments Affecting Rehab Potential  h/o right shoulder pain, ongoing    PT Frequency  2x / week    PT Duration  4 weeks    PT Treatment/Interventions  ADLs/Self Care Home Management;Electrical Stimulation;DME Instruction;Therapeutic exercise;Patient/family education;Orthotic Fit/Training;Manual techniques;Scar mobilization;Passive range of motion;Iontophoresis 50m/ml Dexamethasone    PT Next Visit Plan  Continue iontophoresis to right biceps tendon area of tenderness if her appointment follows XRT for that day; shoulder ROM    Consulted and Agree with Plan of Care  Patient       Patient will benefit from skilled therapeutic intervention in order to improve the following deficits and impairments:  Decreased range of motion, Increased fascial restricitons, Pain, Decreased knowledge of precautions, Decreased knowledge of use of DME  Visit Diagnosis: Stiffness of right shoulder, not elsewhere classified  Right shoulder pain, unspecified chronicity  Stiffness of left shoulder, not elsewhere classified  Other symptoms and signs involving the musculoskeletal system     Problem List Patient Active Problem List   Diagnosis Date Noted  . Malignant neoplasm of upper-outer quadrant of left breast in female, estrogen receptor positive ([place] 08/29/2017  . Genetic testing 07/31/2017  . Malignant neoplasm of upper-inner quadrant of right breast in female, estrogen receptor positive ([place] 07/24/2017  . Family history of breast cancer   . Family history of prostate cancer     Castro,Marie 10/17/2017, 8:44 PM  [place] [address]Phone: [phone]  Fax:  [phone] Name: HCOLLEN HOSTLERMRN: [id number] of  Birth: 402-Mar-1972 DSerafina Royals PT 10/17/17 8:45 PM

## 2017-10-17 NOTE — Patient Instructions (Signed)

## 2017-10-18 ENCOUNTER — Ambulatory Visit
Admission: RE | Admit: 2017-10-18 | Discharge: 2017-10-18 | Disposition: A | Discharge status: Home / Self Care | Payer: BC Managed Care – PPO | Source: Ambulatory Visit | Attending: Radiation Oncology | Admitting: Radiation Oncology

## 2017-10-18 DIAGNOSIS — Z510 Encounter for antineoplastic radiation therapy: Secondary | ICD-10-CM | POA: Diagnosis not present

## 2017-10-19 ENCOUNTER — Ambulatory Visit
Admission: RE | Admit: 2017-10-19 | Discharge: 2017-10-19 | Disposition: A | Discharge status: Home / Self Care | Payer: BC Managed Care – PPO | Source: Ambulatory Visit | Attending: Radiation Oncology | Admitting: Radiation Oncology

## 2017-10-19 DIAGNOSIS — Z510 Encounter for antineoplastic radiation therapy: Secondary | ICD-10-CM | POA: Diagnosis not present

## 2017-10-22 ENCOUNTER — Ambulatory Visit
Admission: RE | Admit: 2017-10-22 | Discharge: 2017-10-22 | Disposition: A | Discharge status: Home / Self Care | Payer: BC Managed Care – PPO | Source: Ambulatory Visit | Attending: Radiation Oncology | Admitting: Radiation Oncology

## 2017-10-22 DIAGNOSIS — Z510 Encounter for antineoplastic radiation therapy: Secondary | ICD-10-CM | POA: Diagnosis not present

## 2017-10-23 ENCOUNTER — Ambulatory Visit
Admission: RE | Admit: 2017-10-23 | Discharge: 2017-10-23 | Disposition: A | Discharge status: Home / Self Care | Payer: BC Managed Care – PPO | Source: Ambulatory Visit | Attending: Radiation Oncology | Admitting: Radiation Oncology

## 2017-10-23 ENCOUNTER — Encounter: Payer: BC Managed Care – PPO | Admitting: Physical Therapy

## 2017-10-23 DIAGNOSIS — Z170 Estrogen receptor positive status [ER+]: Secondary | ICD-10-CM | POA: Insufficient documentation

## 2017-10-23 DIAGNOSIS — Z510 Encounter for antineoplastic radiation therapy: Secondary | ICD-10-CM | POA: Diagnosis not present

## 2017-10-23 DIAGNOSIS — C50211 Malignant neoplasm of upper-inner quadrant of right female breast: Secondary | ICD-10-CM | POA: Diagnosis not present

## 2017-10-23 DIAGNOSIS — C50412 Malignant neoplasm of upper-outer quadrant of left female breast: Secondary | ICD-10-CM | POA: Diagnosis not present

## 2017-10-24 ENCOUNTER — Ambulatory Visit
Admission: RE | Admit: 2017-10-24 | Discharge: 2017-10-24 | Disposition: A | Discharge status: Home / Self Care | Payer: BC Managed Care – PPO | Source: Ambulatory Visit | Attending: Radiation Oncology | Admitting: Radiation Oncology

## 2017-10-24 ENCOUNTER — Ambulatory Visit: Payer: BC Managed Care – PPO | Admitting: Physical Therapy

## 2017-10-24 DIAGNOSIS — R29898 Other symptoms and signs involving the musculoskeletal system: Secondary | ICD-10-CM

## 2017-10-24 DIAGNOSIS — M25511 Pain in right shoulder: Secondary | ICD-10-CM

## 2017-10-24 DIAGNOSIS — M25612 Stiffness of left shoulder, not elsewhere classified: Secondary | ICD-10-CM

## 2017-10-24 DIAGNOSIS — M25611 Stiffness of right shoulder, not elsewhere classified: Secondary | ICD-10-CM

## 2017-10-24 DIAGNOSIS — C50211 Malignant neoplasm of upper-inner quadrant of right female breast: Secondary | ICD-10-CM | POA: Diagnosis not present

## 2017-10-24 NOTE — Therapy (Signed)
[place], [place], [address] Phone: [phone]   Fax:  [phone]  Physical Therapy Treatment  Patient Details  Name: Marie Castro MRN: [id number] Date of Birth: 1970/12/09 Referring Provider: Dr. [name]   Encounter Date: 10/24/2017  PT End of Session - 10/24/17 1707    Visit Number  7    Number of Visits  11    Date for PT Re-Evaluation  10/31/17    PT Start Time  1602    PT Stop Time  1654    PT Time Calculation (min)  52 min    Activity Tolerance  Patient tolerated treatment well    Behavior During Therapy  [facility] for tasks assessed/performed       Past Medical History:  Diagnosis Date  . Anxiety   . Cancer [facility])    breast  . Family history of breast cancer   . Family history of heart disease   . Family history of prostate cancer   . Hypothyroidism   . Thyroid disease     Past Surgical History:  Procedure Laterality Date  . BREAST BIOPSY Right 07/12/2017  . BREAST LUMPECTOMY WITH RADIOACTIVE SEED AND SENTINEL LYMPH NODE BIOPSY Right 08/16/2017   Procedure: RIGHT BREAST LUMPECTOMY WITH RADIOACTIVE SEED AND RIGHT SENTINEL LYMPH NODE BIOPSY;  Surgeon: [name], MD;  Location: [place];  Service: General;  Laterality: Right;  . BREAST LUMPECTOMY WITH RADIOACTIVE SEED LOCALIZATION Left 08/16/2017   Procedure: LEFT BREAST PARTIAL MASTECTOMY WITH BRACKETED RADIOACTIVE SEED LOCALIZATION;  Surgeon: [name], MD;  Location: [place];  Service: General;  Laterality: Left;  . DILATION AND CURETTAGE OF UTERUS    . KNEE SURGERY    . MULTIPLE TOOTH EXTRACTIONS    . RE-EXCISION OF BREAST LUMPECTOMY Left 08/27/2017   Procedure: LEFT RE-EXCISION OF BREAST LUMPECTOMY ERAS PATHWAY;  Surgeon: [name], MD;  Location: [place];  Service: General;  Laterality: Left;  . WISDOM TOOTH EXTRACTION      There were no vitals filed for this visit.  Subjective Assessment - 10/24/17  1609    Subjective  "I tried zumba on Sunday again and I had to stop doing my arms, I just couldn't take it, and it was a slower paced class."  Did fine with ionto patch.    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  Right lumpectomy and partial mastectomy on left 08/16/17 and reexcision for clear margins on the left 08/27/17; sentinel nodes removed from right, and those were negative. Will start XRT 10/03/17 and will have about 33 treatments.  Is on tamoxifen, which she started before surgery.  Hypothyroid, situational anxiety. h/o right shoulder physical therapy due to pain, which included dry needling; tries to avoid positions and movements that cause pain.    Currently in Pain?  No/denies         [facility] PT Assessment - 10/24/17 0001      AROM   Right Shoulder Flexion  150 Degrees    Right Shoulder External Rotation  66 Degrees in supine; in sitting, 81 degrees    Left Shoulder Flexion  160 Degrees                  [facility] Adult PT Treatment/Exercise - 10/24/17 0001      Shoulder Exercises: Prone   Other Prone Exercises  Hughston exercises x 10 each in painfree  range for all six on right side; then did same on left for comparison      Shoulder Exercises: Sidelying   External Rotation  Strengthening;Right;10 reps;Weights 2 sets    External Rotation Weight (lbs)  3      Iontophoresis   Type of Iontophoresis  Dexamethasone    Location  right biceps tendon area of tenderness    Dose  1 ml    Time  6 hours in STAT patch      Manual Therapy   Myofascial Release  crosshands at right shoulder to right lateral abdomen    Passive ROM  right shoulder into flexion then er; then traction with er still has pain at end range of er    Other Manual Therapy  cross friction massage to right biceps tendon area of tenderness    Kinesiotex  Inhibit Muscle      Kinesiotix   Inhibit Muscle   horizontal  strip placed to posterior right shoulder from upper arm to upper back with pt holding arm held in er and with tension on the tape; also vertical strip at right upper trap to inhibit elevation                  PT Long Term Goals - 10/17/17 2041      PT LONG TERM GOAL #1   Title  Pt. will be independent in HEP for shoulder ROM.    Status  Partially Met      PT LONG TERM GOAL #2   Title  Pt. will be knowledgeable about lymphedema risk reduction    Status  Achieved      PT LONG TERM GOAL #3   Title  Pt. will report at least 50% decreased pain inferior to left breast with raising left arm    Status  Achieved      PT LONG TERM GOAL #4   Title  Quick DASH score reduced to 10 or below    Baseline  20 on eval and on 10/17/17, though she reports she does note improvement    Status  On-going            Plan - 10/24/17 1707    Clinical Impression Statement  Continued with right shoulder stretching, iontophoresis, and kinesiotaping.  Added trial of Hughston exercises today, with patient doing each side for comparison, but working to strengthen right side.    Rehab Potential  Excellent    Clinical Impairments Affecting Rehab Potential  h/o right shoulder pain, ongoing    PT Frequency  2x / week    PT Duration  4 weeks    PT Treatment/Interventions  ADLs/Self Care Home Management;Electrical Stimulation;DME Instruction;Therapeutic exercise;Patient/family education;Orthotic Fit/Training;Manual techniques;Scar mobilization;Passive range of motion;Iontophoresis 57m/ml Dexamethasone    PT Next Visit Plan  Do Hughston exercises again and give for HEP for right shoulder; continue right shoulder ROM (flexion, er to tolerance); Continue iontophoresis to right biceps tendon area of tenderness if her appointment follows XRT for that day; continue kinesiotape    Consulted and Agree with Plan of Care  Patient       Patient will benefit from skilled therapeutic intervention in order to improve  the following deficits and impairments:  Decreased range of motion, Increased fascial restricitons, Pain, Decreased knowledge of precautions, Decreased knowledge of use of DME  Visit Diagnosis: Stiffness of right shoulder, not elsewhere classified  Right shoulder pain, unspecified chronicity  Stiffness of left shoulder, not elsewhere classified  Other symptoms and  signs involving the musculoskeletal system     Problem List Patient Active Problem List   Diagnosis Date Noted  . Malignant neoplasm of upper-outer quadrant of left breast in female, estrogen receptor positive ([place]) 08/29/2017  . Genetic testing 07/31/2017  . Malignant neoplasm of upper-inner quadrant of right breast in female, estrogen receptor positive ([place]) 07/24/2017  . Family history of breast cancer   . Family history of prostate cancer     SALISBURY,DONNA 10/24/2017, 5:11 PM  [place], [place], [address] Phone: [phone]   Fax:  [phone]  Name: Marie Castro MRN: [id number] Date of Birth: 1971-09-06  Serafina Royals, PT 10/24/17 5:11 PM

## 2017-10-25 ENCOUNTER — Ambulatory Visit
Admission: RE | Admit: 2017-10-25 | Discharge: 2017-10-25 | Disposition: A | Discharge status: Home / Self Care | Payer: BC Managed Care – PPO | Source: Ambulatory Visit | Attending: Radiation Oncology | Admitting: Radiation Oncology

## 2017-10-25 DIAGNOSIS — C50211 Malignant neoplasm of upper-inner quadrant of right female breast: Secondary | ICD-10-CM | POA: Diagnosis not present

## 2017-10-26 ENCOUNTER — Ambulatory Visit
Admission: RE | Admit: 2017-10-26 | Discharge: 2017-10-26 | Disposition: A | Discharge status: Home / Self Care | Payer: BC Managed Care – PPO | Source: Ambulatory Visit | Attending: Radiation Oncology | Admitting: Radiation Oncology

## 2017-10-26 DIAGNOSIS — C50211 Malignant neoplasm of upper-inner quadrant of right female breast: Secondary | ICD-10-CM | POA: Diagnosis not present

## 2017-10-29 ENCOUNTER — Ambulatory Visit: Payer: BC Managed Care – PPO | Admitting: Physical Therapy

## 2017-10-29 ENCOUNTER — Ambulatory Visit
Admission: RE | Admit: 2017-10-29 | Discharge: 2017-10-29 | Disposition: A | Discharge status: Home / Self Care | Payer: BC Managed Care – PPO | Source: Ambulatory Visit | Attending: Radiation Oncology | Admitting: Radiation Oncology

## 2017-10-29 DIAGNOSIS — R29898 Other symptoms and signs involving the musculoskeletal system: Secondary | ICD-10-CM

## 2017-10-29 DIAGNOSIS — M25611 Stiffness of right shoulder, not elsewhere classified: Secondary | ICD-10-CM | POA: Diagnosis not present

## 2017-10-29 DIAGNOSIS — M25612 Stiffness of left shoulder, not elsewhere classified: Secondary | ICD-10-CM

## 2017-10-29 DIAGNOSIS — M25511 Pain in right shoulder: Secondary | ICD-10-CM

## 2017-10-29 DIAGNOSIS — C50211 Malignant neoplasm of upper-inner quadrant of right female breast: Secondary | ICD-10-CM | POA: Diagnosis not present

## 2017-10-29 NOTE — Therapy (Signed)
[place], [place], [address] Phone: [phone]   Fax:  [phone]  Physical Therapy Treatment  Patient Details  Name: Marie Castro MRN: [id number] Date of Birth: [birth year] Referring Provider: Dr. [name]   Encounter Date: 10/29/2017  PT End of Session - 10/29/17 1706    Visit Number  8    Number of Visits  11    Date for PT Re-Evaluation  10/31/17    PT Start Time  1610    PT Stop Time  1657    PT Time Calculation (min)  47 min    Activity Tolerance  Patient tolerated treatment well    Behavior During Therapy  [facility] for tasks assessed/performed       Past Medical History:  Diagnosis Date  . Anxiety   . Cancer [facility])    breast  . Family history of breast cancer   . Family history of heart disease   . Family history of prostate cancer   . Hypothyroidism   . Thyroid disease     Past Surgical History:  Procedure Laterality Date  . BREAST BIOPSY Right 07/12/2017  . BREAST LUMPECTOMY WITH RADIOACTIVE SEED AND SENTINEL LYMPH NODE BIOPSY Right 08/16/2017   Procedure: RIGHT BREAST LUMPECTOMY WITH RADIOACTIVE SEED AND RIGHT SENTINEL LYMPH NODE BIOPSY;  Surgeon: [name], MD;  Location: [place];  Service: General;  Laterality: Right;  . BREAST LUMPECTOMY WITH RADIOACTIVE SEED LOCALIZATION Left 08/16/2017   Procedure: LEFT BREAST PARTIAL MASTECTOMY WITH BRACKETED RADIOACTIVE SEED LOCALIZATION;  Surgeon: [name], MD;  Location: [place];  Service: General;  Laterality: Left;  . DILATION AND CURETTAGE OF UTERUS    . KNEE SURGERY    . MULTIPLE TOOTH EXTRACTIONS    . RE-EXCISION OF BREAST LUMPECTOMY Left 08/27/2017   Procedure: LEFT RE-EXCISION OF BREAST LUMPECTOMY ERAS PATHWAY;  Surgeon: [name], MD;  Location: [place];  Service: General;  Laterality: Left;  . WISDOM TOOTH EXTRACTION      There were no vitals filed for this visit.  Subjective Assessment - 10/29/17  1612    Subjective  "On days right after radiation, I'm very tired." Didn't do zumba on Sunday--so far, that's really been the only time that it's ached. I'm not terribly active--challenging the right shoulder pain.    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  Right lumpectomy and partial mastectomy on left 08/16/17 and reexcision for clear margins on the left 08/27/17; sentinel nodes removed from right, and those were negative. Will start XRT 10/03/17 and will have about 33 treatments.  Is on tamoxifen, which she started before surgery.  Hypothyroid, situational anxiety. h/o right shoulder physical therapy due to pain, which included dry needling; tries to avoid positions and movements that cause pain.    Currently in Pain?  No/denies                      [facility] Adult PT Treatment/Exercise - 10/29/17 0001      Shoulder Exercises: Prone   Other Prone Exercises  Hughston exercises for right shoulder only, 10 reps for all six exercises.      Shoulder Exercises: Sidelying   External Rotation  Strengthening;Right;10 reps;Weights 2 sets    External Rotation Weight (lbs)  3      Manual Therapy   Myofascial Release  crosshands from right upper arm just above  axilla in diagonal to mid-abdomen, to right abdomen, and to right flank;    Passive ROM  in supine to right shoulder into er, abduction, and flexion; same to left, though patient tolerated less with left and was able to move her left arm well on her own motion into er appeared increased today    Other Manual Therapy  cross friction massage to right biceps tendon area of tenderness    Kinesiotex  Inhibit Muscle      Kinesiotix   Inhibit Muscle   horizontal strip placed to posterior right shoulder from upper arm to upper back with pt holding arm held in er and with tension on the tape; also vertical strip at right upper trap to inhibit elevation              PT Education - 10/29/17 1708    Education provided  Yes    Education Details  Hughston rotator cuff strengthening exercises in prone, 10 reps each to start (increase to 2 to 3 sets as able), for right shoulder    Person(s) Educated  Patient    Methods  Explanation;Handout;Verbal cues    Comprehension  Verbalized understanding;Returned demonstration          PT Long Term Goals - 10/17/17 2041      PT LONG TERM GOAL #1   Title  Pt. will be independent in HEP for shoulder ROM.    Status  Partially Met      PT LONG TERM GOAL #2   Title  Pt. will be knowledgeable about lymphedema risk reduction    Status  Achieved      PT LONG TERM GOAL #3   Title  Pt. will report at least 50% decreased pain inferior to left breast with raising left arm    Status  Achieved      PT LONG TERM GOAL #4   Title  Quick DASH score reduced to 10 or below    Baseline  20 on eval and on 10/17/17, though she reports she does note improvement    Status  On-going            Plan - 10/29/17 1706    Clinical Impression Statement  Though patient continues to have limitations with right shoulder pain, she did appear to tolerate increased range of motion into external rotation today; she also appeared to do Hughston exercises with greater ease. She did have a palpable "pop"or clunk in each shoulder with passive er.    Rehab Potential  Excellent    Clinical Impairments Affecting Rehab Potential  h/o right shoulder pain, ongoing    PT Frequency  2x / week    PT Duration  4 weeks    PT Treatment/Interventions  ADLs/Self Care Home Management;Electrical Stimulation;DME Instruction;Therapeutic exercise;Patient/family education;Orthotic Fit/Training;Manual techniques;Scar mobilization;Passive range of motion;Iontophoresis 72m/ml Dexamethasone    PT Next Visit Plan  Will need renewal next visit; continue right shoulder ROM (flexion, er to tolerance); Continue iontophoresis to right biceps tendon area  of tenderness if her appointment follows XRT for that day; continue kinesiotape    PT Home Exercise Plan  Hughston rotator cuff strengthening    Consulted and Agree with Plan of Care  Patient       Patient will benefit from skilled therapeutic intervention in order to improve the following deficits and impairments:  Decreased range of motion, Increased fascial restricitons, Pain, Decreased knowledge of precautions, Decreased knowledge of use of DME  Visit Diagnosis: Stiffness of right shoulder,  not elsewhere classified  Right shoulder pain, unspecified chronicity  Stiffness of left shoulder, not elsewhere classified  Other symptoms and signs involving the musculoskeletal system     Problem List Patient Active Problem List   Diagnosis Date Noted  . Malignant neoplasm of upper-outer quadrant of left breast in female, estrogen receptor positive ([place]) 08/29/2017  . Genetic testing 07/31/2017  . Malignant neoplasm of upper-inner quadrant of right breast in female, estrogen receptor positive ([place]) 07/24/2017  . Family history of breast cancer   . Family history of prostate cancer     Marie Castro 10/29/2017, 5:10 PM  [place], [place], [address] Phone: [phone]   Fax:  [phone]  Name: Marie Castro MRN: [id number] Date of Birth: Aug 07, 1971  Serafina Royals, PT 10/29/17 5:10 PM

## 2017-10-30 ENCOUNTER — Ambulatory Visit
Admission: RE | Admit: 2017-10-30 | Discharge: 2017-10-30 | Disposition: A | Discharge status: Home / Self Care | Payer: BC Managed Care – PPO | Source: Ambulatory Visit | Attending: Radiation Oncology | Admitting: Radiation Oncology

## 2017-10-30 DIAGNOSIS — C50211 Malignant neoplasm of upper-inner quadrant of right female breast: Secondary | ICD-10-CM | POA: Diagnosis not present

## 2017-10-30 DIAGNOSIS — Z170 Estrogen receptor positive status [ER+]: Principal | ICD-10-CM

## 2017-10-30 DIAGNOSIS — C50412 Malignant neoplasm of upper-outer quadrant of left female breast: Secondary | ICD-10-CM

## 2017-10-30 MED ORDER — RADIAPLEXRX EX GEL
Freq: Once | CUTANEOUS | Status: AC
  Administered 2017-10-30: 16:00:00 via TOPICAL

## 2017-10-31 ENCOUNTER — Ambulatory Visit
Admission: RE | Admit: 2017-10-31 | Discharge: 2017-10-31 | Disposition: A | Discharge status: Home / Self Care | Payer: BC Managed Care – PPO | Source: Ambulatory Visit | Attending: Radiation Oncology | Admitting: Radiation Oncology

## 2017-10-31 ENCOUNTER — Ambulatory Visit: Payer: BC Managed Care – PPO | Admitting: Physical Therapy

## 2017-10-31 DIAGNOSIS — M25611 Stiffness of right shoulder, not elsewhere classified: Secondary | ICD-10-CM

## 2017-10-31 DIAGNOSIS — M25612 Stiffness of left shoulder, not elsewhere classified: Secondary | ICD-10-CM

## 2017-10-31 DIAGNOSIS — M25511 Pain in right shoulder: Secondary | ICD-10-CM

## 2017-10-31 DIAGNOSIS — R29898 Other symptoms and signs involving the musculoskeletal system: Secondary | ICD-10-CM

## 2017-10-31 DIAGNOSIS — C50211 Malignant neoplasm of upper-inner quadrant of right female breast: Secondary | ICD-10-CM | POA: Diagnosis not present

## 2017-10-31 NOTE — Therapy (Signed)
[facility] [address] [place], [place], [address] Phone: [phone]   Fax:  [phone]  Physical Therapy Treatment  Patient Details  Name: Marie Castro MRN: [id number] Date of Birth: 07/10/1971 Referring Provider: Dr. Faera Byerly   Encounter Date: 10/31/2017  PT End of Session - 10/31/17 1700    Visit Number  9    Number of Visits  15    Date for PT Re-Evaluation  12/07/17    PT Start Time  1605    PT Stop Time  1651    PT Time Calculation (min)  46 min    Activity Tolerance  Patient tolerated treatment well    Behavior During Therapy  WFL for tasks assessed/performed       Past Medical History:  Diagnosis Date  . Anxiety   . Cancer (HCC)    breast  . Family history of breast cancer   . Family history of heart disease   . Family history of prostate cancer   . Hypothyroidism   . Thyroid disease     Past Surgical History:  Procedure Laterality Date  . BREAST BIOPSY Right 07/12/2017  . BREAST LUMPECTOMY WITH RADIOACTIVE SEED AND SENTINEL LYMPH NODE BIOPSY Right 08/16/2017   Procedure: RIGHT BREAST LUMPECTOMY WITH RADIOACTIVE SEED AND RIGHT SENTINEL LYMPH NODE BIOPSY;  Surgeon: Byerly, Faera, MD;  Location: [facility];  Service: General;  Laterality: Right;  . BREAST LUMPECTOMY WITH RADIOACTIVE SEED LOCALIZATION Left 08/16/2017   Procedure: LEFT BREAST PARTIAL MASTECTOMY WITH BRACKETED RADIOACTIVE SEED LOCALIZATION;  Surgeon: Byerly, Faera, MD;  Location: [facility];  Service: General;  Laterality: Left;  . DILATION AND CURETTAGE OF UTERUS    . KNEE SURGERY    . MULTIPLE TOOTH EXTRACTIONS    . RE-EXCISION OF BREAST LUMPECTOMY Left 08/27/2017   Procedure: LEFT RE-EXCISION OF BREAST LUMPECTOMY ERAS PATHWAY;  Surgeon: Byerly, Faera, MD;  Location: [facility];  Service: General;  Laterality: Left;  . WISDOM TOOTH EXTRACTION      There were no vitals filed for this visit.  Subjective Assessment - 10/31/17  1609    Subjective  "I think things have definitely quieted down. 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  Right lumpectomy and partial mastectomy on left 08/16/17 and reexcision for clear margins on the left 08/27/17; sentinel nodes removed from right, and those were negative. Will start XRT 10/03/17 and will have about 33 treatments.  Is on tamoxifen, which she started before surgery.  Hypothyroid, situational anxiety. h/o right shoulder physical therapy due to pain, which included dry needling; tries to avoid positions and movements that cause pain.    Currently in Pain?  No/denies         [facility] PT Assessment - 10/31/17 0001      AROM   Right Shoulder External Rotation  66 Degrees in supine, prior to stretching; 76 after stretching                  [facility] Adult PT Treatment/Exercise - 10/31/17 0001      Iontophoresis   Type of Iontophoresis  Dexamethasone    Location  right biceps tendon area of tenderness    Dose  1 ml    Time  6 hours in STAT   patch      Manual Therapy   Myofascial Release  myofascial pulling of right UE with movement into flexion; same on left side; crosshands in vertical at each side of chest, also in diagonal at each side    Passive ROM  right shoulder into flexion then er;    Other Manual Therapy  cross friction massage to right biceps tendon area of tenderness    McConnell       Kinesiotex  Inhibit Muscle      Kinesiotix   Inhibit Muscle   horizontal strip placed to posterior right shoulder from upper arm to upper back with pt holding arm held in er and with tension on the tape; also vertical strip at right upper trap to inhibit elevation                  PT Long Term Goals - 10/31/17 1706      PT LONG TERM GOAL #1   Title  Pt. will be  independent in HEP for shoulder ROM.    Status  Partially Met      PT LONG TERM GOAL #2   Title  Pt. will be knowledgeable about lymphedema risk reduction    Status  Achieved      PT LONG TERM GOAL #3   Title  Pt. will report at least 50% decreased pain inferior to left breast with raising left arm    Status  Achieved      PT LONG TERM GOAL #4   Title  Quick DASH score reduced to 10 or below    Baseline  20 on eval and on 10/17/17, though she reports she does note improvement    Status  On-going      PT LONG TERM GOAL #5   Title  Pt. will report at least 60% decrease in right shoulder pain with moving the right arm into painful positions that she had noted in the past.    Time  3    Period  Weeks    Status  New            Plan - 10/31/17 1702    Clinical Impression Statement  Patient is making slow progress, but progress nonetheless.  Her right shoulder external rotation measurement has improved, and she is not noticing aching in the right shoulder as much as she has in the past.  We discussed and agreed to continue therapy to get as much progress as possible. She had her fourth iontophoresis treatment to right biceps tendon today.    Rehab Potential  Excellent    Clinical Impairments Affecting Rehab Potential  h/o right shoulder pain, ongoing    PT Frequency  2x / week    PT Duration  4 weeks    PT Treatment/Interventions  ADLs/Self Care Home Management;Electrical Stimulation;DME Instruction;Therapeutic exercise;Patient/family education;Orthotic Fit/Training;Manual techniques;Scar mobilization;Passive range of motion;Iontophoresis 4mg/ml Dexamethasone    PT Next Visit Plan  Continue iontophoresis to right biceps tendon 2-4 more times (for a total of 6-8 treatments).  Continue right shoulder ROM and left shoulder ROM to a lesser extent.  Check on her performing [place] exercises at home and have her do those in clinic for the right shoulder; consider adding a one pound weight to  those if tolerated.    PT Home Exercise Plan  [place] rotator cuff strengthening    Consulted and Agree with Plan of Care  Patient       Patient will benefit from skilled therapeutic   intervention in order to improve the following deficits and impairments:  Decreased range of motion, Increased fascial restricitons, Pain, Decreased knowledge of precautions, Decreased knowledge of use of DME  Visit Diagnosis: Stiffness of right shoulder, not elsewhere classified - Plan: PT plan of care cert/re-cert  Right shoulder pain, unspecified chronicity - Plan: PT plan of care cert/re-cert  Stiffness of left shoulder, not elsewhere classified - Plan: PT plan of care cert/re-cert  Other symptoms and signs involving the musculoskeletal system - Plan: PT plan of care cert/re-cert     Problem List Patient Active Problem List   Diagnosis Date Noted  . Malignant neoplasm of upper-outer quadrant of left breast in female, estrogen receptor positive ([facility]) 08/29/2017  . Genetic testing 07/31/2017  . Malignant neoplasm of upper-inner quadrant of right breast in female, estrogen receptor positive ([facility]) 07/24/2017  . Family history of breast cancer   . Family history of prostate cancer     Deanthony Maull 10/31/2017, 5:09 PM  [facility] [place], [place], [address] Phone: [phone]   Fax:  [phone]  Name: Marie Castro MRN: [id number] Date of Birth: 1971-06-08  Serafina Royals, PT 10/31/17 5:09 PM

## 2017-11-01 ENCOUNTER — Ambulatory Visit: Payer: BC Managed Care – PPO

## 2017-11-02 ENCOUNTER — Ambulatory Visit
Admission: RE | Admit: 2017-11-02 | Discharge: 2017-11-02 | Disposition: A | Discharge status: Home / Self Care | Payer: BC Managed Care – PPO | Source: Ambulatory Visit | Attending: Radiation Oncology | Admitting: Radiation Oncology

## 2017-11-02 ENCOUNTER — Telehealth: Payer: Self-pay | Admitting: Oncology

## 2017-11-02 ENCOUNTER — Inpatient Hospital Stay: Payer: BC Managed Care – PPO | Attending: Medical | Admitting: Medical

## 2017-11-02 VITALS — BP 116/66 | HR 67 | Temp 97.7°F | Resp 18 | Ht 66.0 in | Wt 154.7 lb

## 2017-11-02 VITALS — BP 114/74 | HR 67 | Temp 98.3°F | Ht 66.0 in | Wt 152.8 lb

## 2017-11-02 DIAGNOSIS — C50211 Malignant neoplasm of upper-inner quadrant of right female breast: Secondary | ICD-10-CM | POA: Diagnosis not present

## 2017-11-02 DIAGNOSIS — Z170 Estrogen receptor positive status [ER+]: Principal | ICD-10-CM

## 2017-11-02 DIAGNOSIS — Z803 Family history of malignant neoplasm of breast: Secondary | ICD-10-CM | POA: Diagnosis not present

## 2017-11-02 DIAGNOSIS — G43009 Migraine without aura, not intractable, without status migrainosus: Secondary | ICD-10-CM | POA: Diagnosis present

## 2017-11-02 DIAGNOSIS — C50412 Malignant neoplasm of upper-outer quadrant of left female breast: Secondary | ICD-10-CM

## 2017-11-02 DIAGNOSIS — Z8041 Family history of malignant neoplasm of ovary: Secondary | ICD-10-CM | POA: Diagnosis not present

## 2017-11-02 DIAGNOSIS — Z923 Personal history of irradiation: Secondary | ICD-10-CM | POA: Diagnosis not present

## 2017-11-02 DIAGNOSIS — D0511 Intraductal carcinoma in situ of right breast: Secondary | ICD-10-CM | POA: Insufficient documentation

## 2017-11-02 DIAGNOSIS — R112 Nausea with vomiting, unspecified: Secondary | ICD-10-CM | POA: Insufficient documentation

## 2017-11-02 DIAGNOSIS — F419 Anxiety disorder, unspecified: Secondary | ICD-10-CM | POA: Insufficient documentation

## 2017-11-02 MED ORDER — PROMETHAZINE HCL 25 MG PO TABS: 25.0000 mg | ORAL_TABLET | Freq: Four times a day (QID) | ORAL | 2 refills | Status: DC | PRN

## 2017-11-02 MED ORDER — SONAFINE EX EMUL
1.0000 "application " | Freq: Once | CUTANEOUS | Status: AC
  Administered 2017-11-02: 1 via TOPICAL

## 2017-11-02 MED ORDER — ONDANSETRON 8 MG PO TBDP: 8.0000 mg | ORAL_TABLET | Freq: Three times a day (TID) | ORAL | 2 refills | Status: DC | PRN

## 2017-11-02 MED ORDER — SUMATRIPTAN SUCCINATE 50 MG PO TABS: 50.0000 mg | ORAL_TABLET | ORAL | 2 refills | Status: DC | PRN

## 2017-11-02 NOTE — Telephone Encounter (Signed)
Patient called and said she is having trouble with migraines/nausea since starting radiation.  She started having nausea on Tuesday this week and went to urgent care and was given zofran.  She missed 3 days of work this week.  Advised her to take zofran and hour before her treatment today and that she can see a doctor after treatment today.  She verbalized understanding and agreement.

## 2017-11-02 NOTE — Patient Instructions (Signed)
Excedrin Migraine

## 2017-11-02 NOTE — Progress Notes (Signed)
Patient has completed 20 fractions to her bil breasts.  She reports having headaches/migraines that started the first weekend after radiation started.  She has not had migraines before. She said the first one was on the left side of her head and she vomited once.  She had another headache on Tuesday evening with nausea.  Wednesday she vomited and headache worsened and she had light sensitivity.  She vomited yesterday and headache worsened again.  She went to urgent care and was given Zofran.  This morning she felt better with a remainder of a headache.  She took Zofran before treatment today and still does not feel well.  She said she has been under a lot of stress with her diagnosis, radiation treatment, work and caring for her family.  She is very fatigued.  Patient referred to symptom management clinic per Dr. Isidore Moos.  Talked to [place], RN with symptom management and was given an appointment.  Escorted patient to scheduling to check in.

## 2017-11-05 ENCOUNTER — Ambulatory Visit
Admission: RE | Admit: 2017-11-05 | Discharge: 2017-11-05 | Disposition: A | Discharge status: Home / Self Care | Payer: BC Managed Care – PPO | Source: Ambulatory Visit | Attending: Radiation Oncology | Admitting: Radiation Oncology

## 2017-11-05 ENCOUNTER — Telehealth: Payer: Self-pay | Admitting: Medical

## 2017-11-05 DIAGNOSIS — C50211 Malignant neoplasm of upper-inner quadrant of right female breast: Secondary | ICD-10-CM | POA: Diagnosis not present

## 2017-11-05 NOTE — Progress Notes (Signed)
Symptoms Management Clinic Progress Note   Marie Castro [id number] Sep 25, 1971 47 y.o.  Doretha Sou is managed by Dr. Jana Hakim  Actively treated with chemotherapy: no  Current Therapy: Radiation therapy  Last Treated: 11/02/2017  Assessment: Plan:    Migraine without aura and without status migrainosus, not intractable - Plan: SUMAtriptan (IMITREX) 50 MG tablet, promethazine (PHENERGAN) 25 MG tablet, ondansetron (ZOFRAN-ODT) 8 MG disintegrating tablet  Non-intractable vomiting with nausea, unspecified vomiting type - Plan: promethazine (PHENERGAN) 25 MG tablet, ondansetron (ZOFRAN-ODT) 8 MG disintegrating tablet   Migraine without aura: Patient was given a prescription for Imitrex 50 mg, Phenergan 25 mg, and Zofran 8 mg.  Non-intractable nausea with vomiting: Patient being in a prescription for Phenergan 25 mg, and Zofran 8 mg.  Please see After Visit Summary for patient specific instructions.  Future Appointments  Date Time Provider [place]  11/06/2017  3:15 PM [facility] [id number] [facility] None  11/06/2017  3:35 PM [name], MD [facility] None  11/07/2017  3:15 PM [facility] [id number] [facility] None  11/08/2017  3:15 PM [facility] [id number] [facility] None  11/09/2017  3:15 PM [facility] [id number] [facility] None  11/12/2017  3:15 PM [facility] [id number] [facility] None  11/13/2017  2:30 PM [facility] [id number] [facility] None  11/13/2017  3:00 PM [facility] LAB 3 [facility] None  11/13/2017  3:30 PM Magrinat, [name], MD [facility] None  11/14/2017  3:15 PM [facility] [id number] [facility] None  11/14/2017  4:00 PM Collie Siad A, PTA [facility] None  11/15/2017  3:15 PM [facility] [id number] [facility] None  11/16/2017  3:15 PM [facility] [id number] [facility] None  11/19/2017  3:15 PM [facility] [id number] [facility] None  11/19/2017  4:00 PM Jomarie Longs, PT [facility] None  11/20/2017  3:15 PM [facility] [id number] [facility] None    11/21/2017  3:15 PM [facility] [id number] [facility] None  11/22/2017  3:15 PM [facility] [id number] [facility] None  11/22/2017  4:00 PM Kipp Laurence, PT [facility] None  11/26/2017  3:15 PM Jomarie Longs, PT [facility] None  11/28/2017  3:15 PM Jomarie Longs, PT [facility] None  12/03/2017  3:15 PM Jomarie Longs, PT [facility] None  12/05/2017  3:15 PM Jomarie Longs, PT [facility] None    No orders of the defined types were placed in this encounter.      Subjective:   Patient ID:  Marie Castro is a 47 y.o. (DOB 1971-06-17) female.  Chief Complaint:  Chief Complaint  Patient presents with  . 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  The patient is currently treated with radiation therapy under the care of Dr. Tyler Pita.  She reports that she has had 2 episodes of a headache with nausea and vomiting.  Her first headache occurred in the first week of January after starting radiation therapy.  She took a tramadol for her headache without complete resolution.  Most recently she reports having nausea since last Wednesday with a headache last evening which was severe.  She has had no auras.  She denies any changes in strength or visual changes.  She believes that her headache could be related to the stress that she has undergone since her diagnosis.  She continues to work, has 2 children who are in school and has 2 elderly parents.  She reports that her parents and her  husband have been helping out as they are able.  Medications: I have reviewed the patient's current medications.  Allergies:  Allergies  Allergen Reactions  . Oxycodone Other (See Comments)    Pt states could not sleep at all, was up 24 hrs  . Latex Rash    Pt states rubber bands from her braces causes ulcers in her mouth    Past Medical  History:  Diagnosis Date  . Anxiety   . Cancer [facility])    breast  . Family history of breast cancer   . Family history of heart disease   . Family history of prostate cancer   . Hypothyroidism   . Thyroid disease     Past Surgical History:  Procedure Laterality Date  . BREAST BIOPSY Right 07/12/2017  . BREAST LUMPECTOMY WITH RADIOACTIVE SEED AND SENTINEL LYMPH NODE BIOPSY Right 08/16/2017   Procedure: RIGHT BREAST LUMPECTOMY WITH RADIOACTIVE SEED AND RIGHT SENTINEL LYMPH NODE BIOPSY;  Surgeon: [name], MD;  Location: [facility];  Service: General;  Laterality: Right;  . BREAST LUMPECTOMY WITH RADIOACTIVE SEED LOCALIZATION Left 08/16/2017   Procedure: LEFT BREAST PARTIAL MASTECTOMY WITH BRACKETED RADIOACTIVE SEED LOCALIZATION;  Surgeon: [name], MD;  Location: [facility];  Service: General;  Laterality: Left;  . DILATION AND CURETTAGE OF UTERUS    . KNEE SURGERY    . MULTIPLE TOOTH EXTRACTIONS    . RE-EXCISION OF BREAST LUMPECTOMY Left 08/27/2017   Procedure: LEFT RE-EXCISION OF BREAST LUMPECTOMY ERAS PATHWAY;  Surgeon: [name], MD;  Location: [facility];  Service: General;  Laterality: Left;  . WISDOM TOOTH EXTRACTION      Family History  Problem Relation Age of Onset  . Cancer Mother        SKIN AND BREAST  . Heart disease Mother   . Heart disease Father   . Hyperlipidemia Father   . Depression Father   . Cancer Maternal Grandmother        BREAST  . Melanoma Maternal Grandmother        toe  . Cancer Maternal Grandfather        PROSTATE  . Irritable bowel syndrome Paternal Grandmother   . Heart disease Paternal Grandfather   . Breast cancer Maternal Aunt 72  . Breast cancer Maternal Aunt        dx in her 72s    Social History   Socioeconomic History  . Marital status: Married    Spouse name: Not on file  . Number of children: 2  . Years of education: Not on file  . Highest education level: Not on file  Social Needs  . Financial resource  strain: Not on file  . Food insecurity - worry: Not on file  . Food insecurity - inability: Not on file  . Transportation needs - medical: Not on file  . Transportation needs - non-medical: Not on file  Occupational History  . Not on file  Tobacco Use  . Smoking status: Never Smoker  . Smokeless tobacco: Never Used  Substance and Sexual Activity  . Alcohol use: No  . Drug use: No  . Sexual activity: Yes  Other Topics Concern  . Not on file  Social History Narrative  . Not on file    Past Medical History, Surgical history, Social history, and Family history were reviewed and updated as appropriate.   Please see review of systems for further details on the patient's review from today.   Review of Systems:  Review  of Systems  Eyes: Negative for photophobia and visual disturbance.  Gastrointestinal: Positive for nausea and vomiting.  Neurological: Positive for headaches. Negative for dizziness, facial asymmetry and weakness.    Objective:   Physical Exam:  BP 116/66 (BP Location: Left Arm, Patient Position: Sitting)   Pulse 67   Temp 97.7 F (36.5 C) (Oral)   Resp 18   Ht '5\' 6"'  (1.676 m)   Wt 154 lb 11.2 oz (70.2 kg)   SpO2 99%   BMI 24.97 kg/m  ECOG: 0  Physical Exam  Constitutional: No distress.  HENT:  Head: Normocephalic and atraumatic.  Musculoskeletal: Normal range of motion.  Neurological: She is alert. She has normal strength. She displays no tremor. She exhibits normal muscle tone. Coordination normal.  Skin: She is not diaphoretic.  Psychiatric: She has a normal mood and affect. Her behavior is normal. Judgment and thought content normal.    Lab Review:     Component Value Date/Time   NA 136 08/10/2017 0851   K 4.4 08/10/2017 0851   CL 104 08/10/2017 0851   CO2 28 08/10/2017 0851   GLUCOSE 75 08/10/2017 0851   BUN 10 08/10/2017 0851   CREATININE 0.89 08/10/2017 0851   CALCIUM 9.3 08/10/2017 0851   GFRNONAA >60 08/10/2017 0851   GFRAA >60  08/10/2017 0851       Component Value Date/Time   WBC 5.5 08/10/2017 0851   RBC 4.70 08/10/2017 0851   HGB 13.7 08/10/2017 0851   HCT 40.8 08/10/2017 0851   PLT 287 08/10/2017 0851   MCV 86.8 08/10/2017 0851   MCH 29.1 08/10/2017 0851   MCHC 33.6 08/10/2017 0851   RDW 12.4 08/10/2017 0851   -------------------------------  Imaging from last 24 hours (if applicable):  Radiology interpretation: No results found.

## 2017-11-05 NOTE — Telephone Encounter (Signed)
Per 1/25 no los °

## 2017-11-06 ENCOUNTER — Ambulatory Visit
Admission: RE | Admit: 2017-11-06 | Discharge: 2017-11-06 | Disposition: A | Discharge status: Home / Self Care | Payer: BC Managed Care – PPO | Source: Ambulatory Visit | Attending: Radiation Oncology | Admitting: Radiation Oncology

## 2017-11-06 DIAGNOSIS — C50211 Malignant neoplasm of upper-inner quadrant of right female breast: Secondary | ICD-10-CM | POA: Diagnosis not present

## 2017-11-06 DIAGNOSIS — Z170 Estrogen receptor positive status [ER+]: Principal | ICD-10-CM

## 2017-11-06 DIAGNOSIS — C50412 Malignant neoplasm of upper-outer quadrant of left female breast: Secondary | ICD-10-CM

## 2017-11-06 NOTE — Progress Notes (Signed)
Simulation verification  The patient was brought to the treatment machine and placed in the plan treatment position.  Clinical set up was verified to ensure that the target region is appropriately covered for the patient's upcoming electron boost treatment.  The targeted volume of tissue is appropriately covered by the radiation field.  Based on my personal review, I approve the simulation verification.  The patient's treatment will proceed as planned.  ------------------------------------------------  [name], MD

## 2017-11-07 ENCOUNTER — Ambulatory Visit
Admission: RE | Admit: 2017-11-07 | Discharge: 2017-11-07 | Disposition: A | Discharge status: Home / Self Care | Payer: BC Managed Care – PPO | Source: Ambulatory Visit | Attending: Radiation Oncology | Admitting: Radiation Oncology

## 2017-11-07 DIAGNOSIS — C50211 Malignant neoplasm of upper-inner quadrant of right female breast: Secondary | ICD-10-CM | POA: Diagnosis not present

## 2017-11-08 ENCOUNTER — Ambulatory Visit
Admission: RE | Admit: 2017-11-08 | Discharge: 2017-11-08 | Disposition: A | Discharge status: Home / Self Care | Payer: BC Managed Care – PPO | Source: Ambulatory Visit | Attending: Radiation Oncology | Admitting: Radiation Oncology

## 2017-11-08 DIAGNOSIS — C50211 Malignant neoplasm of upper-inner quadrant of right female breast: Secondary | ICD-10-CM | POA: Diagnosis not present

## 2017-11-09 ENCOUNTER — Ambulatory Visit
Admission: RE | Admit: 2017-11-09 | Discharge: 2017-11-09 | Disposition: A | Discharge status: Home / Self Care | Payer: BC Managed Care – PPO | Source: Ambulatory Visit | Attending: Radiation Oncology | Admitting: Radiation Oncology

## 2017-11-09 DIAGNOSIS — C50211 Malignant neoplasm of upper-inner quadrant of right female breast: Secondary | ICD-10-CM | POA: Diagnosis not present

## 2017-11-12 ENCOUNTER — Ambulatory Visit
Admission: RE | Admit: 2017-11-12 | Discharge: 2017-11-12 | Disposition: A | Discharge status: Home / Self Care | Payer: BC Managed Care – PPO | Source: Ambulatory Visit | Attending: Radiation Oncology | Admitting: Radiation Oncology

## 2017-11-12 ENCOUNTER — Other Ambulatory Visit: Payer: Self-pay

## 2017-11-12 DIAGNOSIS — C50211 Malignant neoplasm of upper-inner quadrant of right female breast: Secondary | ICD-10-CM

## 2017-11-12 DIAGNOSIS — Z170 Estrogen receptor positive status [ER+]: Principal | ICD-10-CM

## 2017-11-13 ENCOUNTER — Inpatient Hospital Stay: Payer: BC Managed Care – PPO | Attending: Medical | Admitting: Oncology

## 2017-11-13 ENCOUNTER — Ambulatory Visit
Admission: RE | Admit: 2017-11-13 | Discharge: 2017-11-13 | Disposition: A | Discharge status: Home / Self Care | Payer: BC Managed Care – PPO | Source: Ambulatory Visit | Attending: Radiation Oncology | Admitting: Radiation Oncology

## 2017-11-13 ENCOUNTER — Inpatient Hospital Stay: Payer: BC Managed Care – PPO

## 2017-11-13 VITALS — BP 99/58 | HR 61 | Temp 98.2°F | Resp 18 | Ht 66.0 in | Wt 152.4 lb

## 2017-11-13 DIAGNOSIS — C50211 Malignant neoplasm of upper-inner quadrant of right female breast: Secondary | ICD-10-CM

## 2017-11-13 DIAGNOSIS — R5383 Other fatigue: Secondary | ICD-10-CM | POA: Insufficient documentation

## 2017-11-13 DIAGNOSIS — C50412 Malignant neoplasm of upper-outer quadrant of left female breast: Secondary | ICD-10-CM

## 2017-11-13 DIAGNOSIS — Z79899 Other long term (current) drug therapy: Secondary | ICD-10-CM

## 2017-11-13 DIAGNOSIS — F419 Anxiety disorder, unspecified: Secondary | ICD-10-CM

## 2017-11-13 DIAGNOSIS — Z79810 Long term (current) use of selective estrogen receptor modulators (SERMs): Secondary | ICD-10-CM | POA: Diagnosis not present

## 2017-11-13 DIAGNOSIS — E039 Hypothyroidism, unspecified: Secondary | ICD-10-CM | POA: Diagnosis not present

## 2017-11-13 DIAGNOSIS — Z8042 Family history of malignant neoplasm of prostate: Secondary | ICD-10-CM | POA: Diagnosis not present

## 2017-11-13 DIAGNOSIS — Z170 Estrogen receptor positive status [ER+]: Secondary | ICD-10-CM | POA: Insufficient documentation

## 2017-11-13 DIAGNOSIS — G43909 Migraine, unspecified, not intractable, without status migrainosus: Secondary | ICD-10-CM

## 2017-11-13 DIAGNOSIS — Z803 Family history of malignant neoplasm of breast: Secondary | ICD-10-CM

## 2017-11-13 DIAGNOSIS — Z923 Personal history of irradiation: Secondary | ICD-10-CM | POA: Diagnosis not present

## 2017-11-13 DIAGNOSIS — Z808 Family history of malignant neoplasm of other organs or systems: Secondary | ICD-10-CM

## 2017-11-13 LAB — CMP (CANCER CENTER ONLY)
ALBUMIN: 3.8 g/dL (ref 3.5–5.0)
ALT: 13 U/L (ref 0–55)
AST: 16 U/L (ref 5–34)
Alkaline Phosphatase: 33 U/L — ABNORMAL LOW (ref 40–150)
Anion gap: 6 (ref 3–11)
BUN: 9 mg/dL (ref 7–26)
CO2: 30 mmol/L — AB (ref 22–29)
CREATININE: 0.81 mg/dL (ref 0.60–1.10)
Calcium: 8.9 mg/dL (ref 8.4–10.4)
Chloride: 103 mmol/L (ref 98–109)
GFR, Est AFR Am: >60 mL/min (ref >60)
GFR, Estimated: >60 mL/min (ref >60)
GLUCOSE: 80 mg/dL (ref 70–140)
POTASSIUM: 4.5 mmol/L (ref 3.5–5.1)
SODIUM: 139 mmol/L (ref 136–145)
Total Bilirubin: 0.3 mg/dL (ref 0.2–1.2)
Total Protein: 6.7 g/dL (ref 6.4–8.3)

## 2017-11-13 LAB — CBC WITH DIFFERENTIAL (CANCER CENTER ONLY)
BASOS ABS: 0 10*3/uL (ref 0.0–0.1)
BASOS PCT: 1 %
EOS ABS: 0.2 10*3/uL (ref 0.0–0.5)
EOS PCT: 5 %
HCT: 37.2 % (ref 34.8–46.6)
Hemoglobin: 12.4 g/dL (ref 11.6–15.9)
Lymphocytes Relative: 14 %
Lymphs Abs: 0.7 10*3/uL — ABNORMAL LOW (ref 0.9–3.3)
MCH: 29.1 pg (ref 25.1–34.0)
MCHC: 33.5 g/dL (ref 31.5–36.0)
MCV: 87 fL (ref 79.5–101.0)
MONO ABS: 0.6 10*3/uL (ref 0.1–0.9)
Monocytes Relative: 13 %
Neutro Abs: 3.1 10*3/uL (ref 1.5–6.5)
Neutrophils Relative %: 67 %
PLATELETS: 227 10*3/uL (ref 145–400)
RBC: 4.28 MIL/uL (ref 3.70–5.45)
RDW: 12.3 % (ref 11.2–14.5)
WBC: 4.6 10*3/uL (ref 3.9–10.3)

## 2017-11-13 NOTE — Progress Notes (Signed)
Marie Castro  [phone] Fax:([phone]) [phone]     ID: Marie Castro DOB: 1971/09/22  MR#: [id number]  IRW#:[id number]  Patient Care Team: [name], PA-C as PCP - General (Nurse Practitioner) Adaora Mchaney, [name], MD as Consulting Physician (Oncology) [name], MD as Consulting Physician (General Surgery) [name], MD as Consulting Physician (Radiation Oncology) [name], PA-C as Physician Assistant (Obstetrics and Gynecology) Register, Amber, PA-C as Physician Assistant (Dermatology) OTHER MD:  CHIEF COMPLAINT: Invasive lobular breast cancer, estrogen receptor positive  CURRENT TREATMENT: Adjuvant radiation ongoing; tamoxifen   HISTORY OF CURRENT ILLNESS: From the original intake note:  Marie Castro had bilateral screening mammography at [facility] 07/06/2017, showing a new 1.5 cm oval mass in the right breast upper inner quadrant, a possible development asymmetry in the left breast upper outer quadrant, and a 0.3 cm area of calcifications in the left breast upper outer quadrant. On 07/11/2017 the patient underwent left diagnostic mammography with tomography and bilateral breast ultrasonography. The left breast mammography showed the breast density to be category C. In the upper-outer quadrant of the left breast there was a 0.8 cm oval mass which by ultrasound was a benign complicated cyst. There was also a 0.3 cm area of grouped calcifications in the left breast upper outer quadrant.  In the right breast, ultrasonography showed an irregular mass in the upper inner quadrant adjacent to a benign 1.5 cm simple cyst.  On 07/12/2017 she underwent bilateral biopsies. On the right there was an invasive lobular carcinoma, grade 1 or 2, estrogen receptor 80% positive with moderate staining intensity, progesterone receptor 95% positive with strong staining intensity, with an MIB-1 of 1%, and no HER-2 amplification, the signals ratio being 1.12 and the number per cell  1.85. ([id number])  On the left the biopsy showed atypical lobular hyperplasia.  Both axillae were sonographically benign  The patient's subsequent history is as detailed below.  INTERVAL HISTORY: Marie Castro returns today for follow-up and treatment of her estrogen receptor positive breast cancer. She continues on Tamoxifen, with good tolerance. She denies hot flashes or increase in vaginal wetness. She is now taking Tamoxifen at night to aid with recent nausea, which mildly helped. She is also taking Rx phenergan to aid with this. She denies nausea during the day, but reports that she has intermittent episodes of this. She reports that her nausea may be related to her migraines.   She reports that she is at the end of her radiation treatment which she has tolerated well thus far. Her last radiation treatment will be 11/22/2017.   Note that after missing her period for a month she just started it again about a week    REVIEW OF SYSTEMS: Marie Castro reports that she typically has hot chocolate or cappuccino for breakfast and she consumes lunch at 11 AM. For dinner, she will sometimes eat only a snack because she likes to eat an early dinner and her family doesn't.  She reports increase in fatigue and reports that she has tried Zumba classes that she discontinued following her recent migraine. She was previously prescribed Imitrex, but she hasn't taken it as of yet. Marie Castro notes that if she has a headache she will take ibuprofen. She notes that at work, she is able to walk up and down the hallways and that is her form of exercise at this time. She reports that she skipped her menstrual cycle in December, but reports that her menstrual cycle returned last week. She denies unusual headaches, visual  changes, nausea, vomiting, or dizziness. There has been no unusual cough, phlegm production, or pleurisy. This been no change in bowel or bladder habits. She denies unexplained fatigue or unexplained weight loss,  bleeding, rash, or fever. A detailed review of systems was otherwise stable.       PAST MEDICAL HISTORY: Past Medical History:  Diagnosis Date  . Anxiety   . Cancer [facility])    breast  . Family history of breast cancer   . Family history of heart disease   . Family history of prostate cancer   . Hypothyroidism   . Thyroid disease     PAST SURGICAL HISTORY: Past Surgical History:  Procedure Laterality Date  . BREAST BIOPSY Right 07/12/2017  . BREAST LUMPECTOMY WITH RADIOACTIVE SEED AND SENTINEL LYMPH NODE BIOPSY Right 08/16/2017   Procedure: RIGHT BREAST LUMPECTOMY WITH RADIOACTIVE SEED AND RIGHT SENTINEL LYMPH NODE BIOPSY;  Surgeon: [name], MD;  Location: [place];  Service: General;  Laterality: Right;  . BREAST LUMPECTOMY WITH RADIOACTIVE SEED LOCALIZATION Left 08/16/2017   Procedure: LEFT BREAST PARTIAL MASTECTOMY WITH BRACKETED RADIOACTIVE SEED LOCALIZATION;  Surgeon: [name], MD;  Location: [place];  Service: General;  Laterality: Left;  . DILATION AND CURETTAGE OF UTERUS    . KNEE SURGERY    . MULTIPLE TOOTH EXTRACTIONS    . RE-EXCISION OF BREAST LUMPECTOMY Left 08/27/2017   Procedure: LEFT RE-EXCISION OF BREAST LUMPECTOMY ERAS PATHWAY;  Surgeon: [name], MD;  Location: [place];  Service: General;  Laterality: Left;  . WISDOM TOOTH EXTRACTION      FAMILY HISTORY Family History  Problem Relation Age of Onset  . Cancer Mother        SKIN AND BREAST  . Heart disease Mother   . Heart disease Father   . Hyperlipidemia Father   . Depression Father   . Cancer Maternal Grandmother        BREAST  . Melanoma Maternal Grandmother        toe  . Cancer Maternal Grandfather        PROSTATE  . Irritable bowel syndrome Paternal Grandmother   . Heart disease Paternal Grandfather   . Breast cancer Maternal Aunt 72  . Breast cancer Maternal Aunt        dx in her 75s  The patient's father is 50 year old and her mother 55 year old as of October 2018.  The patient has one sister, no brothers. The patient's mother and mother's mother both had breast cancer in their 42s. A maternal aunt had breast cancer at age 49, and another at age 30. The patient's mother has had genetics testing, which was negative. Please also consulted the detailed genetics pedigree in [place]:  No LMP recorded. Menarche age 19, first live birth age 25, she is still premenopausal, with regular periods lasting approximately 5 days, of which 3 are heavy. She used oral contraceptives approximately 2 years with no complications and also had a subcutaneous depot contraceptive briefly. 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 Her husband Hospital doctor ("Chip") is a Control and instrumentation engineer for [employer]. Their son, Marie Castro, is 31 and daughter Marie Castro as of October 2018. The patient attends the [employer] DIRECTIVES:    HEALTH MAINTENANCE: Social History   Tobacco Use  . Smoking status: Never Smoker  . Smokeless  tobacco: Never Used  Substance Use Topics  . Alcohol use: No  . Drug use: No     Colonoscopy: Never  PAP:  Bone density: Never   Allergies  Allergen Reactions  . Oxycodone Other (See Comments)    Pt states could not sleep at all, was up 24 hrs  . Latex Rash    Pt states rubber bands from her braces causes ulcers in her mouth    Current Outpatient Medications  Medication Sig Dispense Refill  . diphenhydrAMINE (BENADRYL) 12.5 MG/5ML liquid Take 37.5 mg by mouth at bedtime as needed for sleep.    Marland Kitchen ibuprofen (ADVIL,MOTRIN) 200 MG tablet Take 400-600 mg by mouth daily as needed for headache or moderate pain.    Marland Kitchen levothyroxine (SYNTHROID, LEVOTHROID) 100 MCG tablet Take 100 mcg by mouth daily before breakfast.    . LORazepam (ATIVAN) 0.5 MG tablet Take 0.5 mg by mouth 2 (two)  times daily as needed for anxiety.    . Multiple Vitamins-Minerals (MULTIVITAMIN GUMMIES WOMENS) CHEW Chew 2 tablets by mouth daily.    . ondansetron (ZOFRAN-ODT) 8 MG disintegrating tablet Take 1 tablet (8 mg total) by mouth every 8 (eight) hours as needed for nausea or vomiting. 20 tablet 2  . promethazine (PHENERGAN) 25 MG tablet Take 1 tablet (25 mg total) by mouth every 6 (six) hours as needed for nausea or vomiting. 45 tablet 2  . SUMAtriptan (IMITREX) 50 MG tablet Take 1 tablet (50 mg total) by mouth every 2 (two) hours as needed for migraine. May repeat in 2 hours if headache persists or recurs. 10 tablet 2  . tamoxifen (NOLVADEX) 20 MG tablet Take 20 mg daily by mouth.    . traMADol (ULTRAM) 50 MG tablet Take every 6 (six) hours as needed by mouth.    . Wound Dressings (SONAFINE EX) Apply topically.     No current facility-administered medications for this visit.     OBJECTIVE: Young white woman who appears stated age  15:   11/13/17 1529  BP: (!) 99/58  Pulse: 61  Resp: 18  Temp: 98.2 F (36.8 C)  SpO2: 100%     Body mass index is 24.6 kg/m.   Wt Readings from Last 3 Encounters:  11/13/17 152 lb 6.4 oz (69.1 kg)  11/02/17 152 lb 12.8 oz (69.3 kg)  11/02/17 154 lb 11.2 oz (70.2 kg)      ECOG FS:1 - Symptomatic but completely ambulatory  Sclerae unicteric, EOMs intact Oropharynx clear and moist No cervical or supraclavicular adenopathy Lungs no rales or rhonchi Heart regular rate and rhythm Abd soft, nontender, positive bowel sounds MSK no focal spinal tenderness, no upper extremity lymphedema Neuro: nonfocal, well oriented, appropriate affect Breasts: Deferred  Breasts: The right breast is status post recent lumpectomy.  The incision is healing very nicely.  The cosmetic result is good.  There is some hyperesthesia in the axillary region but no erythema or swelling.  The left breast is unremarkable.  The left axilla is benign.  LAB RESULTS:  CMP       Component Value Date/Time   NA 136 08/10/2017 0851   K 4.4 08/10/2017 0851   CL 104 08/10/2017 0851   CO2 28 08/10/2017 0851   GLUCOSE 75 08/10/2017 0851   BUN 10 08/10/2017 0851   CREATININE 0.89 08/10/2017 0851   CALCIUM 9.3 08/10/2017 0851   GFRNONAA >60 08/10/2017 0851   GFRAA >60 08/10/2017 0851    No results found for: TOTALPROTELP, ALBUMINELP, A1GS,  A2GS, BETS, BETA2SER, GAMS, MSPIKE, SPEI  No results found for: Nils Pyle, [facility]  Lab Results  Component Value Date   WBC 4.6 11/13/2017   NEUTROABS 3.1 11/13/2017   HGB 13.7 08/10/2017   HCT 37.2 11/13/2017   MCV 87.0 11/13/2017   PLT 227 11/13/2017      Chemistry      Component Value Date/Time   NA 136 08/10/2017 0851   K 4.4 08/10/2017 0851   CL 104 08/10/2017 0851   CO2 28 08/10/2017 0851   BUN 10 08/10/2017 0851   CREATININE 0.89 08/10/2017 0851      Component Value Date/Time   CALCIUM 9.3 08/10/2017 0851       No results found for: LABCA2  No components found for: OMVEHM094  No results for input(s): INR in the last 168 hours.  No results found for: LABCA2  No results found for: BSJ628  No results found for: ZMO294  No results found for: TML465  No results found for: CA2729  No components found for: HGQUANT  No results found for: CEA1 / No results found for: CEA1   No results found for: AFPTUMOR  No results found for: CHROMOGRNA  No results found for: PSA1  Appointment on 11/13/2017  Component Date Value Ref Range Status  . WBC Count 11/13/2017 4.6  3.9 - 10.3 K/uL Final  . RBC 11/13/2017 4.28  3.70 - 5.45 MIL/uL Final  . Hemoglobin 11/13/2017 12.4  11.6 - 15.9 g/dL Final  . HCT 11/13/2017 37.2  34.8 - 46.6 % Final  . MCV 11/13/2017 87.0  79.5 - 101.0 fL Final  . MCH 11/13/2017 29.1  25.1 - 34.0 pg Final  . MCHC 11/13/2017 33.5  31.5 - 36.0 g/dL Final  . RDW 11/13/2017 12.3  11.2 - 14.5 % Final  . Platelet Count 11/13/2017 227  145 - 400 K/uL Final  .  Neutrophils Relative % 11/13/2017 67  % Final  . Neutro Abs 11/13/2017 3.1  1.5 - 6.5 K/uL Final  . Lymphocytes Relative 11/13/2017 14  % Final  . Lymphs Abs 11/13/2017 0.7* 0.9 - 3.3 K/uL Final  . Monocytes Relative 11/13/2017 13  % Final  . Monocytes Absolute 11/13/2017 0.6  0.1 - 0.9 K/uL Final  . Eosinophils Relative 11/13/2017 5  % Final  . Eosinophils Absolute 11/13/2017 0.2  0.0 - 0.5 K/uL Final  . Basophils Relative 11/13/2017 1  % Final  . Basophils Absolute 11/13/2017 0.0  0.0 - 0.1 K/uL Final   Performed at [facility], [place] [address] [place], [place] [address]    (this displays the last labs from the last 3 days)  No results found for: TOTALPROTELP, ALBUMINELP, A1GS, A2GS, BETS, BETA2SER, GAMS, MSPIKE, SPEI (this displays SPEP labs)  No results found for: KPAFRELGTCHN, LAMBDASER, KAPLAMBRATIO (kappa/lambda light chains)  No results found for: HGBA, HGBA2QUANT, HGBFQUANT, HGBSQUAN (Hemoglobinopathy evaluation)   No results found for: LDH  No results found for: IRON, TIBC, IRONPCTSAT (Iron and TIBC)  No results found for: FERRITIN  Urinalysis No results found for: COLORURINE, APPEARANCEUR, LABSPEC, PHURINE, GLUCOSEU, HGBUR, BILIRUBINUR, KETONESUR, PROTEINUR, UROBILINOGEN, NITRITE, LEUKOCYTESUR   STUDIES: No results found.  ELIGIBLE FOR AVAILABLE RESEARCH PROTOCOL: no  ASSESSMENT: 47 y.o. [place] woman status post right breast upper inner quadrant biopsy 07/11/2017 for a clinical T1c N0, stage IA invasive lobular carcinoma, E-cadherin negative, grade 1 or 2, estrogen and progesterone receptor positive, HER-2 nonamplified, with an MIB-1 of 1%  (1) biopsy of left  breast upper outer quadrant calcifications 07/11/2017 showed atypical lobular hyperplasia  (a) breast MRI 07/19/2017 shows 2 additional areas of concern in the left breast, with MRI guided biopsy 10/Castro/2018 showing ductal carcinoma in situ x2, low-grade, estrogen and progesterone  receptor strongly positive.  (2) genetics testing 07/27/2017 through the STAT Breast cancer panel offered by [facility] found no deleterious mutations in ATM, BRCA1, BRCA2, CDH1, CHEK2, PALB2, PTEN, STK11 and TP53.   Reflexed to the Multi-Gene Panel offered by [facility] finding no deleterious mutations in ALK, APC, ATM, AXIN2,BAP1,  BARD1, BLM, BMPR1A, BRCA1, BRCA2, BRIP1, CASR, CDC73, CDH1, CDK4, CDKN1B, CDKN1C, CDKN2A (p14ARF), CDKN2A (p16INK4a), CEBPA, CHEK2, CTNNA1, DICER1, DIS3L2, EGFR (c.2369C>T, p.Thr790Met variant only), EPCAM (Deletion/duplication testing only), FH, FLCN, GATA2, GPC3, GREM1 (Promoter region deletion/duplication testing only), HOXB13 (c.251G>A, p.Gly84Glu), HRAS, KIT, MAX, MEN1, MET, MITF (c.952G>A, p.Glu318Lys variant only), MLH1, MSH2, MSH3, MSH6, MUTYH, NBN, NF1, NF2, NTHL1, PALB2, PDGFRA, PHOX2B, PMS2, POLD1, POLE, POT1, PRKAR1A, PTCH1, PTEN, RAD50, RAD51C, RAD51D, RB1, RECQL4, RET, RUNX1, SDHAF2, SDHA (sequence changes only), SDHB, SDHC, SDHD, SMAD4, SMARCA4, SMARCB1, SMARCE1, STK11, SUFU, TERT, TERT, TMEM127, TP53, TSC1, TSC2, VHL, WRN and WT1.   (3) tamoxifen started neoadjuvantly 07/24/2017  (4) status post right lumpectomy and sentinel lymph node sampling 08/16/2017 for a pT1c pN0, stage IA invasive lobular carcinoma, grade 2, total of 3 sentinel nodes removed  (5) status post left lumpectomy 08/16/2017 for ductal carcinoma in situ, low-grade, with focally positive margins, Tis NX.  (a) additional surgery for margin clearance 08/27/2017 was successful ([id number])  (6) Oncotype DX score of 12 predicts a 10-year risk of recurrence outside the breast of 8% if the patient's only systemic therapy is tamoxifen for 5 years.  It also predicts no benefit from chemotherapy.  (7) adjuvant radiation 10/04/2018 to 11/22/2017:  (8) to continue anti-estrogens a minimum of 5 years, then consider 2 years additional aromatase inhibitor treatment.  PLAN: Tonnya is tolerating the  tamoxifen well and the plan will be to continue that a minimum of 5 years after which we will consider 2 years of aromatase inhibitor therapy.  She is doing well with the radiation.  So far no desquamation and only moderate fatigue.  She understands the fatigue from radiation will last a couple of months beyond completion of treatments.    I have strongly encouraged her to start an exercise program particularly walking and gave her information on the trails to recovery program.  We discussed the fact that tamoxifen can change the periods in any number of ways, from making no changes, to stopping them, to making them less frequent or less frequent, or heavier or lighter.  What it does not do is provide contraception.  They are using barrier methods at present.  They understand that if she does get pregnant while on tamoxifen and I have very little data regarding safety of the baby.  She will see Dr. Barry Dienes in March.  Likely she will see Dr. Dorathy Daft in March as well.  I am going to see her again in August, before she returns to school.  She knows to call for any issues that may develop before that visit.  Marie Castro, [name], MD  11/13/17 3:43 PM Medical Oncology and Hematology [facility] [address] [place], [place] [address] Tel. [phone]    Fax. [phone]    This document serves as a record of services personally performed by Lurline Del, MD. It was created on his behalf by Steva Colder, a trained medical  scribe. The creation of this record is based on the scribe's personal observations and the provider's statements to them.   I have reviewed the above documentation for accuracy and completeness, and I agree with the above.

## 2017-11-14 ENCOUNTER — Ambulatory Visit: Payer: BC Managed Care – PPO | Attending: General Surgery

## 2017-11-14 ENCOUNTER — Ambulatory Visit: Payer: BC Managed Care – PPO

## 2017-11-14 ENCOUNTER — Ambulatory Visit
Admission: RE | Admit: 2017-11-14 | Discharge: 2017-11-14 | Disposition: A | Discharge status: Home / Self Care | Payer: BC Managed Care – PPO | Source: Ambulatory Visit | Attending: Radiation Oncology | Admitting: Radiation Oncology

## 2017-11-14 DIAGNOSIS — M25612 Stiffness of left shoulder, not elsewhere classified: Secondary | ICD-10-CM | POA: Insufficient documentation

## 2017-11-14 DIAGNOSIS — M25611 Stiffness of right shoulder, not elsewhere classified: Secondary | ICD-10-CM | POA: Diagnosis not present

## 2017-11-14 DIAGNOSIS — R29898 Other symptoms and signs involving the musculoskeletal system: Secondary | ICD-10-CM | POA: Diagnosis present

## 2017-11-14 DIAGNOSIS — M25511 Pain in right shoulder: Secondary | ICD-10-CM | POA: Diagnosis present

## 2017-11-14 DIAGNOSIS — C50211 Malignant neoplasm of upper-inner quadrant of right female breast: Secondary | ICD-10-CM | POA: Diagnosis not present

## 2017-11-14 NOTE — Therapy (Signed)
[place], [place], [address] Phone: [phone]   Fax:  [phone]  Physical Therapy Treatment  Patient Details  Name: Marie Castro MRN: [id number] Date of Birth: December 20, 1970 Referring Provider: Dr. [name]   Encounter Date: 11/14/2017  PT End of Session - 11/14/17 1654    Visit Number  10    Number of Visits  15    Date for PT Re-Evaluation  12/07/17    PT Start Time  0315    PT Stop Time  1650    PT Time Calculation (min)  45 min    Activity Tolerance  Patient tolerated treatment well    Behavior During Therapy  [facility] for tasks assessed/performed       Past Medical History:  Diagnosis Date  . Anxiety   . Cancer [facility])    breast  . Family history of breast cancer   . Family history of heart disease   . Family history of prostate cancer   . Hypothyroidism   . Thyroid disease     Past Surgical History:  Procedure Laterality Date  . BREAST BIOPSY Right 07/12/2017  . BREAST LUMPECTOMY WITH RADIOACTIVE SEED AND SENTINEL LYMPH NODE BIOPSY Right 08/16/2017   Procedure: RIGHT BREAST LUMPECTOMY WITH RADIOACTIVE SEED AND RIGHT SENTINEL LYMPH NODE BIOPSY;  Surgeon: [name], MD;  Location: [place];  Service: General;  Laterality: Right;  . BREAST LUMPECTOMY WITH RADIOACTIVE SEED LOCALIZATION Left 08/16/2017   Procedure: LEFT BREAST PARTIAL MASTECTOMY WITH BRACKETED RADIOACTIVE SEED LOCALIZATION;  Surgeon: [name], MD;  Location: [place];  Service: General;  Laterality: Left;  . DILATION AND CURETTAGE OF UTERUS    . KNEE SURGERY    . MULTIPLE TOOTH EXTRACTIONS    . RE-EXCISION OF BREAST LUMPECTOMY Left 08/27/2017   Procedure: LEFT RE-EXCISION OF BREAST LUMPECTOMY ERAS PATHWAY;  Surgeon: [name], MD;  Location: [place];  Service: General;  Laterality: Left;  . WISDOM TOOTH EXTRACTION      There were no vitals filed for this visit.  Subjective Assessment - 11/14/17  1608    Subjective  I can tell in the last few days my pain has really calmed down. I have been able to pull up my top (like adjusting bra or tank top) without pain. I had a really bad migraine after my last visit and then couldn't get a 4:00 appt until this week.     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  Right lumpectomy and partial mastectomy on left 08/16/17 and reexcision for clear margins on the left 08/27/17; sentinel nodes removed from right, and those were negative. Will start XRT 10/03/17 and will have about 33 treatments.  Is on tamoxifen, which she started before surgery.  Hypothyroid, situational anxiety. h/o right shoulder physical therapy due to pain, which included dry needling; tries to avoid positions and movements that cause pain.    Patient Stated Goals  be painfree, great ROM    Currently in Pain?  No/denies                      [facility] Adult PT Treatment/Exercise - 11/14/17 0001      Iontophoresis   Type of Iontophoresis  Dexamethasone    Location  right biceps tendon area of tenderness    Dose  1 ml    Time  6 hours in  STAT patch      Manual Therapy   Myofascial Release  myofascial pulling of right UE with movement into flexion; same on left side; crosshands in vertical at each side of chest, also in diagonal at each side    Passive ROM  right shoulder into flexion then er;    Other Manual Therapy  cross friction massage to right biceps tendon area of tenderness    Kinesiotex  Inhibit Muscle      Kinesiotix   Inhibit Muscle   horizontal strip placed to posterior right shoulder from upper arm to upper back with pt holding arm held in er and with tension on the tape, did a longer strip today which pt reports feeling more beneficial                   PT Long Term Goals - 10/31/17 1706      PT LONG TERM GOAL #1   Title  Pt. will be independent in HEP for  shoulder ROM.    Status  Partially Met      PT LONG TERM GOAL #2   Title  Pt. will be knowledgeable about lymphedema risk reduction    Status  Achieved      PT LONG TERM GOAL #3   Title  Pt. will report at least 50% decreased pain inferior to left breast with raising left arm    Status  Achieved      PT LONG TERM GOAL #4   Title  Quick DASH score reduced to 10 or below    Baseline  20 on eval and on 10/17/17, though she reports she does note improvement    Status  On-going      PT LONG TERM GOAL #5   Title  Pt. will report at least 60% decrease in right shoulder pain with moving the right arm into painful positions that she had noted in the past.    Time  3    Period  Weeks    Status  New            Plan - 11/14/17 1654    Clinical Impression Statement  Pt has made great gains since last visit. She reports in last few days she has noticed decreased to no pain with getting dressed and reaching. Her end ROM was tight today as she reports had a migraine after last visit for about 3 days which she didn't do much of anything and then had a tension HA for a few days after, plus with ongoing radiation just caused her to feel very tight. By end of session her A/ROM had visibly improved well and pt reported much less tenderness. Continued with iontophoresis (5th) and kinesiotaping as done last session as pt finds both beneficial.     Rehab Potential  Excellent    Clinical Impairments Affecting Rehab Potential  h/o right shoulder pain, ongoing    PT Frequency  2x / week    PT Duration  4 weeks    PT Treatment/Interventions  ADLs/Self Care Home Management;Electrical Stimulation;DME Instruction;Therapeutic exercise;Patient/family education;Orthotic Fit/Training;Manual techniques;Scar mobilization;Passive range of motion;Iontophoresis 41m/ml Dexamethasone    PT Next Visit Plan  Continue iontophoresis to right biceps tendon 1-3 more times (for a total of 6-8 treatments).  Continue right shoulder  ROM and left shoulder ROM to a lesser extent.  Check on her performing Hughston exercises at home and have her do those in clinic for the right shoulder; consider adding a one pound  weight to those if tolerated.    Consulted and Agree with Plan of Care  Patient       Patient will benefit from skilled therapeutic intervention in order to improve the following deficits and impairments:  Decreased range of motion, Increased fascial restricitons, Pain, Decreased knowledge of precautions, Decreased knowledge of use of DME  Visit Diagnosis: Stiffness of right shoulder, not elsewhere classified  Right shoulder pain, unspecified chronicity  Other symptoms and signs involving the musculoskeletal system     Problem List Patient Active Problem List   Diagnosis Date Noted  . Malignant neoplasm of upper-outer quadrant of left breast in female, estrogen receptor positive ([place]) 08/29/2017  . Genetic testing 07/31/2017  . Malignant neoplasm of upper-inner quadrant of right breast in female, estrogen receptor positive ([place]) 07/24/2017  . Family history of breast cancer   . Family history of prostate cancer     Otelia Limes, PTA 11/14/2017, 4:59 PM  [place], [place], [address] Phone: [phone]   Fax:  [phone]  Name: Marie Castro MRN: [id number] Date of Birth: October 02, 1971

## 2017-11-15 ENCOUNTER — Ambulatory Visit
Admission: RE | Admit: 2017-11-15 | Discharge: 2017-11-15 | Disposition: A | Discharge status: Home / Self Care | Payer: BC Managed Care – PPO | Source: Ambulatory Visit | Attending: Radiation Oncology | Admitting: Radiation Oncology

## 2017-11-15 ENCOUNTER — Ambulatory Visit: Payer: BC Managed Care – PPO

## 2017-11-15 DIAGNOSIS — C50211 Malignant neoplasm of upper-inner quadrant of right female breast: Secondary | ICD-10-CM | POA: Diagnosis not present

## 2017-11-16 ENCOUNTER — Ambulatory Visit
Admission: RE | Admit: 2017-11-16 | Discharge: 2017-11-16 | Disposition: A | Discharge status: Home / Self Care | Payer: BC Managed Care – PPO | Source: Ambulatory Visit | Attending: Radiation Oncology | Admitting: Radiation Oncology

## 2017-11-16 DIAGNOSIS — C50211 Malignant neoplasm of upper-inner quadrant of right female breast: Secondary | ICD-10-CM | POA: Diagnosis not present

## 2017-11-19 ENCOUNTER — Ambulatory Visit
Admission: RE | Admit: 2017-11-19 | Discharge: 2017-11-19 | Disposition: A | Discharge status: Home / Self Care | Payer: BC Managed Care – PPO | Source: Ambulatory Visit | Attending: Radiation Oncology | Admitting: Radiation Oncology

## 2017-11-19 ENCOUNTER — Ambulatory Visit: Payer: BC Managed Care – PPO | Admitting: Physical Therapy

## 2017-11-19 DIAGNOSIS — M25611 Stiffness of right shoulder, not elsewhere classified: Secondary | ICD-10-CM

## 2017-11-19 DIAGNOSIS — R29898 Other symptoms and signs involving the musculoskeletal system: Secondary | ICD-10-CM

## 2017-11-19 DIAGNOSIS — M25511 Pain in right shoulder: Secondary | ICD-10-CM

## 2017-11-19 DIAGNOSIS — C50211 Malignant neoplasm of upper-inner quadrant of right female breast: Secondary | ICD-10-CM | POA: Diagnosis not present

## 2017-11-19 DIAGNOSIS — M25612 Stiffness of left shoulder, not elsewhere classified: Secondary | ICD-10-CM

## 2017-11-19 NOTE — Therapy (Signed)
[place], [place], [address] Phone: [phone]   Fax:  [phone]  Physical Therapy Treatment  Patient Details  Name: DEMECIA Castro MRN: [id number] Date of Birth: 23-Feb-1971 Referring Provider: Dr. Stark Castro   Encounter Date: 11/19/2017  PT End of Session - 11/19/17 1702    Visit Number  11    Number of Visits  15    Date for PT Re-Evaluation  12/07/17    PT Start Time  1600    PT Stop Time  1650    PT Time Calculation (min)  50 min    Activity Tolerance  Patient tolerated treatment well    Behavior During Therapy  [facility] for tasks assessed/performed       Past Medical History:  Diagnosis Date  . Anxiety   . Cancer [facility])    breast  . Family history of breast cancer   . Family history of heart disease   . Family history of prostate cancer   . Hypothyroidism   . Thyroid disease     Past Surgical History:  Procedure Laterality Date  . BREAST BIOPSY Right 07/12/2017  . BREAST LUMPECTOMY WITH RADIOACTIVE SEED AND SENTINEL LYMPH NODE BIOPSY Right 08/16/2017   Procedure: RIGHT BREAST LUMPECTOMY WITH RADIOACTIVE SEED AND RIGHT SENTINEL LYMPH NODE BIOPSY;  Surgeon: Marie Klein, MD;  Location: [place];  Service: General;  Laterality: Right;  . BREAST LUMPECTOMY WITH RADIOACTIVE SEED LOCALIZATION Left 08/16/2017   Procedure: LEFT BREAST PARTIAL MASTECTOMY WITH BRACKETED RADIOACTIVE SEED LOCALIZATION;  Surgeon: Marie Klein, MD;  Location: [place];  Service: General;  Laterality: Left;  . DILATION AND CURETTAGE OF UTERUS    . KNEE SURGERY    . MULTIPLE TOOTH EXTRACTIONS    . RE-EXCISION OF BREAST LUMPECTOMY Left 08/27/2017   Procedure: LEFT RE-EXCISION OF BREAST LUMPECTOMY ERAS PATHWAY;  Surgeon: Marie Klein, MD;  Location: [place];  Service: General;  Laterality: Left;  . WISDOM TOOTH EXTRACTION      There were no vitals filed for this visit.  Subjective Assessment - 11/19/17  1605    Subjective  The kinesiotape stayed longer with the longer length. My shoulder is feeling better.  I'm able to tuck my shirt in and it doesn't tweek as much. Is doing the Hughston exercises and can do those without it hurting. Will finish radiation on Thursday.    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  Right lumpectomy and partial mastectomy on left 08/16/17 and reexcision for clear margins on the left 08/27/17; sentinel nodes removed from right, and those were negative. Will start XRT 10/03/17 and will have about 33 treatments.  Is on tamoxifen, which she started before surgery.  Hypothyroid, situational anxiety. h/o right shoulder physical therapy due to pain, which included dry needling; tries to avoid positions and movements that cause pain.    Currently in Pain?  No/denies                      [facility] Adult PT Treatment/Exercise - 11/19/17 0001      Shoulder Exercises: Supine   Horizontal ABduction  AROM;Both;10 reps supine over foam roller, ~5 second holds for stretch    Horizontal ABduction Limitations  -- This was a new stretch instructed by therapist today    Other Supine Exercises  supine over foam roller, relax shoulders back to open up  chest x 1-2 mins.; then bilat. D2 stretches x 5 These were new stretches taught today; supervision for safet      Shoulder Exercises: Prone   Other Prone Exercises  Hughston exercises for right shoulder only, 10 reps for all six exercises.with 1 lb. weight in hand therapist supervised to check tolerance for adding weight      Shoulder Exercises: Sidelying   External Rotation  Strengthening;Right;Weights;15 reps couldn't do more today    External Rotation Weight (lbs)  3 tried 4, but that was uncomfortable      Iontophoresis   Type of Iontophoresis  Dexamethasone    Location  right biceps tendon area of tenderness    Dose  1 ml    Time  6  hours in STAT patch      Manual Therapy   Other Manual Therapy  cross friction massage to right biceps tendon area of tenderness    Kinesiotex  Inhibit Muscle      Kinesiotix   Inhibit Muscle   horizontal strip placed to posterior right shoulder from upper arm to upper back with pt holding arm held in er and with tension on the tape, did a longer strip today which pt reports feeling more beneficial              PT Education - 11/19/17 1701    Education provided  Yes    Education Details  do pect stretches either in supine on physioball or supine over towel roll    Person(s) Educated  Patient    Methods  Explanation;Demonstration;Verbal cues    Comprehension  Verbalized understanding;Returned demonstration          PT Long Term Goals - 11/19/17 1607      PT LONG TERM GOAL #1   Title  Pt. will be independent in HEP for shoulder ROM.    Status  Achieved      PT LONG TERM GOAL #2   Title  Pt. will be knowledgeable about lymphedema risk reduction    Status  Achieved      PT LONG TERM GOAL #3   Title  Pt. will report at least 50% decreased pain inferior to left breast with raising left arm    Status  Achieved      PT LONG TERM GOAL #5   Title  Pt. will report at least 60% decrease in right shoulder pain with moving the right arm into painful positions that she had noted in the past.    Baseline  60-70% improved as of 11/19/17    Status  Achieved            Plan - 11/19/17 1702    Clinical Impression Statement  Pt. reports 60-70% decrease in right shoulder pain with movements that had been painful; she is still being careful, but able to do more without pain, including reaching up overhead. Advanced Hughston exercise to include 1 lb. weights today.    Rehab Potential  Excellent    Clinical Impairments Affecting Rehab Potential  h/o right shoulder pain, ongoing    PT Frequency  2x / week    PT Duration  4 weeks    PT Treatment/Interventions  ADLs/Self Care Home  Management;Electrical Stimulation;DME Instruction;Therapeutic exercise;Patient/family education;Orthotic Fit/Training;Manual techniques;Scar mobilization;Passive range of motion;Iontophoresis 4mg /ml Dexamethasone    PT Next Visit Plan  Repeat Quick DASH at one of next couple of visits. Continue iontophoresis to right biceps tendon 1-2 more times (for a total of 7-8 treatments).  Continue right shoulder ROM and left shoulder ROM to a lesser extent.  Continue 1 lb. or consider 2 lb. weight resistance for Hughston exercises.    PT Home Exercise Plan  Hughston rotator cuff strengthening       Patient will benefit from skilled therapeutic intervention in order to improve the following deficits and impairments:  Decreased range of motion, Increased fascial restricitons, Pain, Decreased knowledge of precautions, Decreased knowledge of use of DME  Visit Diagnosis: Stiffness of right shoulder, not elsewhere classified  Right shoulder pain, unspecified chronicity  Other symptoms and signs involving the musculoskeletal system  Stiffness of left shoulder, not elsewhere classified     Problem List Patient Active Problem List   Diagnosis Date Noted  . Malignant neoplasm of upper-outer quadrant of left breast in female, estrogen receptor positive ([place]) 08/29/2017  . Genetic testing 07/31/2017  . Malignant neoplasm of upper-inner quadrant of right breast in female, estrogen receptor positive ([place]) 07/24/2017  . Family history of breast cancer   . Family history of prostate cancer     Kaydynce Pat 11/19/2017, 5:07 PM  [place], [place], [address] Phone: [phone]   Fax:  [phone]  Name: Marie Castro MRN: [id number] Date of Birth: [birth year]  Serafina Royals, PT 11/19/17 5:07 PM

## 2017-11-20 ENCOUNTER — Ambulatory Visit: Payer: BC Managed Care – PPO

## 2017-11-20 ENCOUNTER — Ambulatory Visit
Admission: RE | Admit: 2017-11-20 | Discharge: 2017-11-20 | Disposition: A | Discharge status: Home / Self Care | Payer: BC Managed Care – PPO | Source: Ambulatory Visit | Attending: Radiation Oncology | Admitting: Radiation Oncology

## 2017-11-20 DIAGNOSIS — C50211 Malignant neoplasm of upper-inner quadrant of right female breast: Secondary | ICD-10-CM | POA: Diagnosis not present

## 2017-11-21 ENCOUNTER — Ambulatory Visit: Payer: BC Managed Care – PPO

## 2017-11-21 ENCOUNTER — Ambulatory Visit
Admission: RE | Admit: 2017-11-21 | Discharge: 2017-11-21 | Disposition: A | Discharge status: Home / Self Care | Payer: BC Managed Care – PPO | Source: Ambulatory Visit | Attending: Radiation Oncology | Admitting: Radiation Oncology

## 2017-11-21 DIAGNOSIS — C50211 Malignant neoplasm of upper-inner quadrant of right female breast: Secondary | ICD-10-CM | POA: Diagnosis not present

## 2017-11-22 ENCOUNTER — Encounter: Payer: BC Managed Care – PPO | Admitting: Physical Therapy

## 2017-11-22 ENCOUNTER — Encounter: Payer: Self-pay | Admitting: Radiation Oncology

## 2017-11-22 ENCOUNTER — Ambulatory Visit
Admission: RE | Admit: 2017-11-22 | Discharge: 2017-11-22 | Disposition: A | Discharge status: Home / Self Care | Payer: BC Managed Care – PPO | Source: Ambulatory Visit | Attending: Radiation Oncology | Admitting: Radiation Oncology

## 2017-11-22 DIAGNOSIS — C50211 Malignant neoplasm of upper-inner quadrant of right female breast: Secondary | ICD-10-CM | POA: Diagnosis not present

## 2017-11-26 ENCOUNTER — Ambulatory Visit: Payer: BC Managed Care – PPO | Admitting: Physical Therapy

## 2017-11-26 DIAGNOSIS — M25612 Stiffness of left shoulder, not elsewhere classified: Secondary | ICD-10-CM

## 2017-11-26 DIAGNOSIS — M25611 Stiffness of right shoulder, not elsewhere classified: Secondary | ICD-10-CM | POA: Diagnosis not present

## 2017-11-26 DIAGNOSIS — M25511 Pain in right shoulder: Secondary | ICD-10-CM

## 2017-11-26 DIAGNOSIS — R29898 Other symptoms and signs involving the musculoskeletal system: Secondary | ICD-10-CM

## 2017-11-26 NOTE — Therapy (Signed)
[place], [place], [address] Phone: [phone]   Fax:  [phone]  Physical Therapy Treatment  Patient Details  Name: Marie Castro MRN: [id number] Date of Birth: [birth year] Referring Provider: Dr. [name]   Encounter Date: 11/26/2017  PT End of Session - 11/26/17 1717    Visit Number  12    Number of Visits  15    Date for PT Re-Evaluation  12/07/17    PT Start Time  1520    PT Stop Time  1603    PT Time Calculation (min)  43 min    Activity Tolerance  Patient tolerated treatment well    Behavior During Therapy  [facility] for tasks assessed/performed       Past Medical History:  Diagnosis Date  . Anxiety   . Cancer [facility])    breast  . Family history of breast cancer   . Family history of heart disease   . Family history of prostate cancer   . Hypothyroidism   . Thyroid disease     Past Surgical History:  Procedure Laterality Date  . BREAST BIOPSY Right 07/12/2017  . BREAST LUMPECTOMY WITH RADIOACTIVE SEED AND SENTINEL LYMPH NODE BIOPSY Right 08/16/2017   Procedure: RIGHT BREAST LUMPECTOMY WITH RADIOACTIVE SEED AND RIGHT SENTINEL LYMPH NODE BIOPSY;  Surgeon: [name], MD;  Location: [place];  Service: General;  Laterality: Right;  . BREAST LUMPECTOMY WITH RADIOACTIVE SEED LOCALIZATION Left 08/16/2017   Procedure: LEFT BREAST PARTIAL MASTECTOMY WITH BRACKETED RADIOACTIVE SEED LOCALIZATION;  Surgeon: [name], MD;  Location: [place];  Service: General;  Laterality: Left;  . DILATION AND CURETTAGE OF UTERUS    . KNEE SURGERY    . MULTIPLE TOOTH EXTRACTIONS    . RE-EXCISION OF BREAST LUMPECTOMY Left 08/27/2017   Procedure: LEFT RE-EXCISION OF BREAST LUMPECTOMY ERAS PATHWAY;  Surgeon: [name], MD;  Location: [place];  Service: General;  Laterality: Left;  . WISDOM TOOTH EXTRACTION      There were no vitals filed for this visit.  Subjective Assessment - 11/26/17  1523    Subjective  Went to a walk-in clinic on Saturday to see about the iontophoresis spot that had gotten red; was told it was contact dermatitis and was given a cream to use on it. Finished radiation on Thursday. Having a lot of itching. Hasn't had time to do the zumba class again.    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  Right lumpectomy and partial mastectomy on left 08/16/17 and reexcision for clear margins on the left 08/27/17; sentinel nodes removed from right, and those were negative. Will start XRT 10/03/17 and will have about 33 treatments.  Is on tamoxifen, which she started before surgery.  Hypothyroid, situational anxiety. h/o right shoulder physical therapy due to pain, which included dry needling; tries to avoid positions and movements that cause pain.    Currently in Pain?  No/denies                      [facility] Adult PT Treatment/Exercise - 11/26/17 0001      Shoulder Exercises: Prone   Other Prone Exercises  Hughston exercises for right shoulder only, 10 reps for all six exercises.with 2 lb. weight in hand therapist supervised to check tolerance for increasing wt.    Other Prone Exercises  Is,Ys, and Ts x 10 each  therapist instructed in this new exercise today      Shoulder Exercises: Sidelying   External Rotation  Strengthening;Right;Weights therapist encouraged increased reps today; 20 was limit    External Rotation Weight (lbs)  3      Modalities   Modalities  Cryotherapy      Cryotherapy   Number Minutes Cryotherapy  2 Minutes    Cryotherapy Location  Shoulder right biceps tendon area of tenderness    Type of Cryotherapy  Ice massage      Manual Therapy   Myofascial Release  unidirectional release just superolateral to SLNB scar on right    Passive ROM  right shoulder into flexion, er, and abduction to tolerance    Other Manual Therapy  cross friction massage  to right biceps tendon area of tenderness                  PT Long Term Goals - 11/26/17 1525      PT LONG TERM GOAL #5   Title  Pt. will report at least 60% decrease in right shoulder pain with moving the right arm into painful positions that she had noted in the past.    Baseline  60-70% improved as of 11/19/17; "It's better.  The right arm is still tighter."    Status  Achieved            Plan - 11/26/17 1717    Clinical Impression Statement  Pt. had had redness at iontophoresis site after last session that was viewed by this therapist a couple of days later at the [facility].  That was less red today, but still mildly inflamed, so iontophoresis was not done. Trial of ice massage to tender area of right biceps (2 mins. or less contact) today. Pt. was able to increase rotator cuff strengthening exercises today.    Rehab Potential  Excellent    Clinical Impairments Affecting Rehab Potential  h/o right shoulder pain, ongoing    PT Frequency  2x / week    PT Duration  4 weeks    PT Treatment/Interventions  ADLs/Self Care Home Management;Electrical Stimulation;DME Instruction;Therapeutic exercise;Patient/family education;Orthotic Fit/Training;Manual techniques;Scar mobilization;Passive range of motion;Iontophoresis 4mg /ml Dexamethasone    PT Next Visit Plan  Repeat Quick DASH at next visit. Continue iontophoresis to right biceps tendon 1-2 more times (for a total of 7-8 treatments) IF irritation has subsided.  Continue right shoulder ROM and left shoulder ROM to a lesser extent.  Continue 2 lb. weight resistance for Hughston exercises; possibly increase reps.    PT Home Exercise Plan  Hughston rotator cuff strengthening    Consulted and Agree with Plan of Care  Patient       Patient will benefit from skilled therapeutic intervention in order to improve the following deficits and impairments:  Decreased range of motion, Increased fascial restricitons, Pain, Decreased  knowledge of precautions, Decreased knowledge of use of DME  Visit Diagnosis: Stiffness of right shoulder, not elsewhere classified  Right shoulder pain, unspecified chronicity  Other symptoms and signs involving the musculoskeletal system  Stiffness of left shoulder, not elsewhere classified     Problem List Patient Active Problem List   Diagnosis Date Noted  . Malignant neoplasm of upper-outer quadrant of left breast in female, estrogen receptor positive ([place]) 08/29/2017  . Genetic testing 07/31/2017  . Malignant neoplasm of upper-inner quadrant of right breast in female, estrogen receptor positive ([name]) 07/24/2017  . Family history of breast cancer   . Family history of  prostate cancer     SALISBURY,DONNA 11/26/2017, 5:21 PM  [place], [place], [address] Phone: [phone]   Fax:  [phone]  Name: AYLINN RYDBERG MRN: [id number] Date of Birth: 10-12-1970  Serafina Royals, PT 11/26/17 5:21 PM

## 2017-11-26 NOTE — Progress Notes (Signed)
  Radiation Oncology         [phone] ________________________________  Name: Marie Castro MRN: [id number]  Date: 11/22/2017  DOB: 12/02/1970  End of Treatment Note  Diagnosis:   47 y.o. female with pTis low grade ductal carcinoma of the left breast and pT1c, pN0 invasive lobular carcinoma of the right breast  Indication for treatment:  Curative     Radiation treatment dates:   10/04/2017 - 11/22/2017  Site/dose:    1. Left Breast / 50.4 Gy in 28 fractions 2. Left Breast Boost / 10 Gy in 5 fractions 3. Right Breast / 50.4 Gy in 28 fractions 4. Right Breast Boost / 12 Gy in 6 fractions  Beams/energy:    1. 3D / 6X  2. 3D / 15E 3. 3D / 6X  4. 3D / 12E  Narrative: The patient tolerated radiation treatment relatively well.  She experienced mild fatigue and radiation-related skin changes. She had erythema and hyperpigmentation present to both breasts but no skin breakdown. She is using her Radiaplex gel as directed.   Plan: The patient has completed radiation treatment. The patient will return to radiation oncology clinic for routine followup in one month. I advised them to call or return sooner if they have any questions or concerns related to their recovery or treatment.  -----------------------------------  Blair Promise, PhD, MD  This document serves as a record of services personally performed by [name], MD. It was created on his behalf by Rae Lips, a trained medical scribe. The creation of this record is based on the scribe's personal observations and the provider's statements to them. This document has been checked and approved by the attending provider.

## 2017-11-28 ENCOUNTER — Ambulatory Visit: Payer: BC Managed Care – PPO | Admitting: Physical Therapy

## 2017-11-28 DIAGNOSIS — M25611 Stiffness of right shoulder, not elsewhere classified: Secondary | ICD-10-CM

## 2017-11-28 DIAGNOSIS — R29898 Other symptoms and signs involving the musculoskeletal system: Secondary | ICD-10-CM

## 2017-11-28 DIAGNOSIS — M25511 Pain in right shoulder: Secondary | ICD-10-CM

## 2017-11-28 NOTE — Therapy (Signed)
[place], [place], [address] Phone: [phone]   Fax:  [phone]  Physical Therapy Treatment  Patient Details  Name: Marie Castro MRN: [id number] Date of Birth: 1971-06-26 Referring Provider: Dr. [name]   Encounter Date: 11/28/2017  PT End of Session - 11/28/17 1721    Visit Number  13    Number of Visits  15    Date for PT Re-Evaluation  12/07/17    PT Start Time  1519    PT Stop Time  1601    PT Time Calculation (min)  42 min    Activity Tolerance  Patient tolerated treatment well    Behavior During Therapy  [facility] for tasks assessed/performed       Past Medical History:  Diagnosis Date  . Anxiety   . Cancer [facility])    breast  . Family history of breast cancer   . Family history of heart disease   . Family history of prostate cancer   . Hypothyroidism   . Thyroid disease     Past Surgical History:  Procedure Laterality Date  . BREAST BIOPSY Right 07/12/2017  . BREAST LUMPECTOMY WITH RADIOACTIVE SEED AND SENTINEL LYMPH NODE BIOPSY Right 08/16/2017   Procedure: RIGHT BREAST LUMPECTOMY WITH RADIOACTIVE SEED AND RIGHT SENTINEL LYMPH NODE BIOPSY;  Surgeon: [name], MD;  Location: [place];  Service: General;  Laterality: Right;  . BREAST LUMPECTOMY WITH RADIOACTIVE SEED LOCALIZATION Left 08/16/2017   Procedure: LEFT BREAST PARTIAL MASTECTOMY WITH BRACKETED RADIOACTIVE SEED LOCALIZATION;  Surgeon: [name], MD;  Location: [place];  Service: General;  Laterality: Left;  . DILATION AND CURETTAGE OF UTERUS    . KNEE SURGERY    . MULTIPLE TOOTH EXTRACTIONS    . RE-EXCISION OF BREAST LUMPECTOMY Left 08/27/2017   Procedure: LEFT RE-EXCISION OF BREAST LUMPECTOMY ERAS PATHWAY;  Surgeon: [name], MD;  Location: [place];  Service: General;  Laterality: Left;  . WISDOM TOOTH EXTRACTION      There were no vitals filed for this visit.  Subjective Assessment - 11/28/17  1522    Subjective  Shoulder felt fine after ice massage the other day.  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  Right lumpectomy and partial mastectomy on left 08/16/17 and reexcision for clear margins on the left 08/27/17; sentinel nodes removed from right, and those were negative. Will start XRT 10/03/17 and will have about 33 treatments.  Is on tamoxifen, which she started before surgery.  Hypothyroid, situational anxiety. h/o right shoulder physical therapy due to pain, which included dry needling; tries to avoid positions and movements that cause pain.    Currently in Pain?  No/denies         [facility] PT Assessment - 11/28/17 0001      Observation/Other Assessments   Observations  right anterior shoulder skin is improved, but still with a couple of small bumps (from ionto patch); pt. says it's still itchy too    Quick DASH   13.64           Quick Dash - 11/28/17 0001    Open a tight or new jar  No difficulty    Do heavy household chores (wash walls, wash floors)  Mild difficulty    Carry a  shopping bag or briefcase  No difficulty    Wash your back  Mild difficulty    Use a knife to cut food  No difficulty    Recreational activities in which you take some force or impact through your arm, shoulder, or hand (golf, hammering, tennis)  Mild difficulty    During the past week, to what extent has your arm, shoulder or hand problem interfered with your normal social activities with family, friends, neighbors, or groups?  Slightly    During the past week, to what extent has your arm, shoulder or hand problem limited your work or other regular daily activities  Slightly    Arm, shoulder, or hand pain.  Mild    Tingling (pins and needles) in your arm, shoulder, or hand  None    Difficulty  Sleeping  No difficulty    DASH Score  13.64 %            [facility] Adult PT Treatment/Exercise - 11/28/17 0001      Shoulder Exercises: Prone   Other Prone Exercises  [place]ugh[place] exercises for right shoulder only, 10 reps for all six exercises.with 2 lb. weight in hand therapist checking painless popping in pt.'s shoulder    Other Prone Exercises  Is,Ys, and Ts x 10 each  Is with arms down at sides today      Shoulder Exercises: Sidelying   External Rotation  -- skipped today as patient feels this made her sore      Cryotherapy   Number Minutes Cryotherapy  2 Minutes    Cryotherapy Location  Shoulder right biceps tendon area of tenderness    Type of Cryotherapy  Ice massage      Manual Therapy   Myofascial Release  unidirectional release just superolateral to SLNB scar on right; crosshands from right axilla to right abdomen, also from right axilla to right flank    Passive ROM  right shoulder into flexion, er, and abduction to tolerance    Other Manual Therapy  cross friction massage to right biceps tendon area of tenderness                  PT Long Term Goals - 11/28/17 1719      PT LONG TERM GOAL #4   Title  Quick DASH score reduced to 10 or below    Baseline  20 on eval and on 10/17/17, though she reports she does note improvement; 13.64 on 11/28/17    Status  Partially Met            Plan - 11/28/17 1721    Clinical Impression Statement  Pt. now has just a couple of small bumps at iontophoresis site, so that was not used again today.  She did note benefit from ice massage at last session, so this was done again today.  She has reduced her score on the quick DASH, but the goal for that has still not been met. Sidelying right shoulder external rotation with 3 lb. weight was not done because pt. felt it made her sore.    Rehab Potential  Excellent    Clinical Impairments Affecting Rehab Potential  h/o right shoulder pain, ongoing    PT Frequency  2x / week    PT  Duration  4 weeks    PT Treatment/Interventions  ADLs/Self Care Home Management;Electrical Stimulation;DME Instruction;Therapeutic exercise;Patient/family education;Orthotic Fit/Training;Manual techniques;Scar mobilization;Passive range of motion;Iontophoresis 7m/ml Dexamethasone    PT Next Visit Plan  Continue iontophoresis to right  biceps tendon 1-2 more times (for a total of 7-8 treatments) IF irritation has subsided.  Continue right shoulder ROM and left shoulder ROM to a lesser extent.  Continue 2 lb. weight resistance for Hughston exercises; possibly increase reps.    PT Home Exercise Plan  Hughston rotator cuff strengthening    Consulted and Agree with Plan of Care  Patient       Patient will benefit from skilled therapeutic intervention in order to improve the following deficits and impairments:  Decreased range of motion, Increased fascial restricitons, Pain, Decreased knowledge of precautions, Decreased knowledge of use of DME  Visit Diagnosis: Stiffness of right shoulder, not elsewhere classified  Right shoulder pain, unspecified chronicity  Other symptoms and signs involving the musculoskeletal system     Problem List Patient Active Problem List   Diagnosis Date Noted  . Malignant neoplasm of upper-outer quadrant of left breast in female, estrogen receptor positive ([place]) 08/29/2017  . Genetic testing 07/31/2017  . Malignant neoplasm of upper-inner quadrant of right breast in female, estrogen receptor positive ([place]) 07/24/2017  . Family history of breast cancer   . Family history of prostate cancer     SALISBURY,DONNA 11/28/2017, 5:24 PM  [place], [place], [address] Phone: [phone]   Fax:  [phone]  Name: GINNA SCHUUR MRN: [id number] Date of Birth: 1971-06-07  Serafina Royals, PT 11/28/17 5:25 PM

## 2017-12-03 ENCOUNTER — Ambulatory Visit: Payer: BC Managed Care – PPO | Admitting: Physical Therapy

## 2017-12-03 DIAGNOSIS — M25511 Pain in right shoulder: Secondary | ICD-10-CM

## 2017-12-03 DIAGNOSIS — M25612 Stiffness of left shoulder, not elsewhere classified: Secondary | ICD-10-CM

## 2017-12-03 DIAGNOSIS — M25611 Stiffness of right shoulder, not elsewhere classified: Secondary | ICD-10-CM

## 2017-12-03 DIAGNOSIS — R29898 Other symptoms and signs involving the musculoskeletal system: Secondary | ICD-10-CM

## 2017-12-03 NOTE — Therapy (Signed)
[place], [place], [address] Phone: [phone]   Fax:  [phone]  Physical Therapy Treatment  Patient Details  Name: Marie Castro MRN: [id number] Date of Birth: 1970-11-06 Referring Provider: Dr. [name]   Encounter Date: 12/03/2017  PT End of Session - 12/03/17 1629    Visit Number  14    Number of Visits  15    Date for PT Re-Evaluation  12/07/17    PT Start Time  0623    PT Stop Time  1600    PT Time Calculation (min)  45 min    Activity Tolerance  Patient tolerated treatment well    Behavior During Therapy  [facility] for tasks assessed/performed       Past Medical History:  Diagnosis Date  . Anxiety   . Cancer [facility])    breast  . Family history of breast cancer   . Family history of heart disease   . Family history of prostate cancer   . Hypothyroidism   . Thyroid disease     Past Surgical History:  Procedure Laterality Date  . BREAST BIOPSY Right 07/12/2017  . BREAST LUMPECTOMY WITH RADIOACTIVE SEED AND SENTINEL LYMPH NODE BIOPSY Right 08/16/2017   Procedure: RIGHT BREAST LUMPECTOMY WITH RADIOACTIVE SEED AND RIGHT SENTINEL LYMPH NODE BIOPSY;  Surgeon: [name], MD;  Location: [place];  Service: General;  Laterality: Right;  . BREAST LUMPECTOMY WITH RADIOACTIVE SEED LOCALIZATION Left 08/16/2017   Procedure: LEFT BREAST PARTIAL MASTECTOMY WITH BRACKETED RADIOACTIVE SEED LOCALIZATION;  Surgeon: [name], MD;  Location: [place];  Service: General;  Laterality: Left;  . DILATION AND CURETTAGE OF UTERUS    . KNEE SURGERY    . MULTIPLE TOOTH EXTRACTIONS    . RE-EXCISION OF BREAST LUMPECTOMY Left 08/27/2017   Procedure: LEFT RE-EXCISION OF BREAST LUMPECTOMY ERAS PATHWAY;  Surgeon: [name], MD;  Location: [place];  Service: General;  Laterality: Left;  . WISDOM TOOTH EXTRACTION      There were no vitals filed for this visit.  Subjective Assessment - 12/03/17  1520    Subjective  At some point in the night I had an itch on my back and it still tweaks that shoulder pain; many things I do are better but that bothered it. It's gotten a whole lot better.    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  Right lumpectomy and partial mastectomy on left 08/16/17 and reexcision for clear margins on the left 08/27/17; sentinel nodes removed from right, and those were negative. Will start XRT 10/03/17 and will have about 33 treatments.  Is on tamoxifen, which she started before surgery.  Hypothyroid, situational anxiety. h/o right shoulder physical therapy due to pain, which included dry needling; tries to avoid positions and movements that cause pain.    Currently in Pain?  No/denies                      [facility] Adult PT Treatment/Exercise - 12/03/17 0001      Elbow Exercises   Elbow Flexion  Strengthening;Right;10 reps;Seated eccentric    Bar Weights/Barbell (Elbow Flexion)  2 lbs then with 5 lbs.      Shoulder Exercises: Prone   Other Prone Exercises  Hughston exercises for right shoulder only, 20 reps for the first one, 15 for others  for all six exercises.with 2 lb. weight  in hand therapist checking painless popping in pt.'s shoulder    Other Prone Exercises  Is,Ys, and Ts x 10 each  Is with arms down at sides today      Iontophoresis   Type of Iontophoresis  Dexamethasone    Location  right biceps tendon area of tenderness    Dose  1 ml    Time  6 hours in STAT patch      Manual Therapy   Myofascial Release  unidirectional release just superolateral to SLNB scar on right; crosshands from right axilla to right abdomen, also from right axilla to right flank and left upper chest to right abdomen    Passive ROM  right shoulder into flexion, er, and abduction to tolerance    Other Manual Therapy  cross friction massage to right biceps tendon area of tenderness  (performed today by Shan Levans, PT)                  PT Long Term Goals - 11/28/17 1719      PT LONG TERM GOAL #4   Title  Quick DASH score reduced to 10 or below    Baseline  20 on eval and on 10/17/17, though she reports she does note improvement; 13.64 on 11/28/17    Status  Partially Met            Plan - 12/03/17 1630    Clinical Impression Statement  Pt. is doing well, though is not painfree yet.  Certain motions (end range of right shoulder internal rotation) are still the problem. Her skin was healed today so iontophoresis was tried again.    Rehab Potential  Excellent    Clinical Impairments Affecting Rehab Potential  h/o right shoulder pain, ongoing    PT Frequency  2x / week    PT Duration  4 weeks    PT Treatment/Interventions  ADLs/Self Care Home Management;Electrical Stimulation;DME Instruction;Therapeutic exercise;Patient/family education;Orthotic Fit/Training;Manual techniques;Scar mobilization;Passive range of motion;Iontophoresis 3m/ml Dexamethasone    PT Next Visit Plan  Continue iontophoresis to right biceps tendon 1 more times (for a total of 8 treatments) IF irritation has subsided.  Continue right shoulder ROM and left shoulder ROM to a lesser extent.  Continue 2 lb. weight resistance for [place] exercises; possibly increase reps.  Continue eccentric biceps contraction with 5 lb. weight    PT Home Exercise Plan  Hughston rotator cuff strengthening    Consulted and Agree with Plan of Care  Patient       Patient will benefit from skilled therapeutic intervention in order to improve the following deficits and impairments:  Decreased range of motion, Increased fascial restricitons, Pain, Decreased knowledge of precautions, Decreased knowledge of use of DME  Visit Diagnosis: Stiffness of right shoulder, not elsewhere classified  Right shoulder pain, unspecified chronicity  Other symptoms and signs involving the musculoskeletal system  Stiffness of  left shoulder, not elsewhere classified     Problem List Patient Active Problem List   Diagnosis Date Noted  . Malignant neoplasm of upper-outer quadrant of left breast in female, estrogen receptor positive ([place] 08/29/2017  . Genetic testing 07/31/2017  . Malignant neoplasm of upper-inner quadrant of right breast in female, estrogen receptor positive ([place] 07/24/2017  . Family history of breast cancer   . Family history of prostate cancer     Kynsleigh Westendorf 12/03/2017, 4:33 PM  [place] [address]: [phone]  Fax:  [phone]  Name: Marie Castro MRN: [id number] Date of Birth: 06/05/1971  Serafina Royals, PT 12/03/17 4:33 PM

## 2017-12-05 ENCOUNTER — Ambulatory Visit: Payer: BC Managed Care – PPO | Admitting: Physical Therapy

## 2017-12-05 DIAGNOSIS — R29898 Other symptoms and signs involving the musculoskeletal system: Secondary | ICD-10-CM

## 2017-12-05 DIAGNOSIS — M25511 Pain in right shoulder: Secondary | ICD-10-CM

## 2017-12-05 DIAGNOSIS — M25611 Stiffness of right shoulder, not elsewhere classified: Secondary | ICD-10-CM

## 2017-12-05 DIAGNOSIS — M25612 Stiffness of left shoulder, not elsewhere classified: Secondary | ICD-10-CM

## 2017-12-05 NOTE — Therapy (Signed)
[place], [place], [address] Phone: [phone]   Fax:  [phone]  Physical Therapy Treatment  Patient Details  Name: Marie Castro MRN: [id number] Date of Birth: March 16, 1971 Referring Provider: Dr. [name]   Encounter Date: 12/05/2017  PT End of Session - 12/05/17 1717    Visit Number  15    Number of Visits  15    PT Start Time  1324    PT Stop Time  1603    PT Time Calculation (min)  47 min    Activity Tolerance  Patient tolerated treatment well    Behavior During Therapy  [facility] for tasks assessed/performed       Past Medical History:  Diagnosis Date  . Anxiety   . Cancer [facility])    breast  . Family history of breast cancer   . Family history of heart disease   . Family history of prostate cancer   . Hypothyroidism   . Thyroid disease     Past Surgical History:  Procedure Laterality Date  . BREAST BIOPSY Right 07/12/2017  . BREAST LUMPECTOMY WITH RADIOACTIVE SEED AND SENTINEL LYMPH NODE BIOPSY Right 08/16/2017   Procedure: RIGHT BREAST LUMPECTOMY WITH RADIOACTIVE SEED AND RIGHT SENTINEL LYMPH NODE BIOPSY;  Surgeon: [name], MD;  Location: [place];  Service: General;  Laterality: Right;  . BREAST LUMPECTOMY WITH RADIOACTIVE SEED LOCALIZATION Left 08/16/2017   Procedure: LEFT BREAST PARTIAL MASTECTOMY WITH BRACKETED RADIOACTIVE SEED LOCALIZATION;  Surgeon: [name], MD;  Location: [place];  Service: General;  Laterality: Left;  . DILATION AND CURETTAGE OF UTERUS    . KNEE SURGERY    . MULTIPLE TOOTH EXTRACTIONS    . RE-EXCISION OF BREAST LUMPECTOMY Left 08/27/2017   Procedure: LEFT RE-EXCISION OF BREAST LUMPECTOMY ERAS PATHWAY;  Surgeon: [name], MD;  Location: [place];  Service: General;  Laterality: Left;  . WISDOM TOOTH EXTRACTION      There were no vitals filed for this visit.  Subjective Assessment - 12/05/17 1519    Subjective  Skin got red again  from iontophoresis.  It's not bumpy, just red.    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  Right lumpectomy and partial mastectomy on left 08/16/17 and reexcision for clear margins on the left 08/27/17; sentinel nodes removed from right, and those were negative. Will start XRT 10/03/17 and will have about 33 treatments.  Is on tamoxifen, which she started before surgery.  Hypothyroid, situational anxiety. h/o right shoulder physical therapy due to pain, which included dry needling; tries to avoid positions and movements that cause pain.    Currently in Pain?  No/denies         [facility] PT Assessment - 12/05/17 0001      AROM   Right Shoulder Flexion  159 Degrees    Right Shoulder ABduction  183 Degrees    Right Shoulder External Rotation  80 Degrees    Left Shoulder Flexion  158 Degrees    Left Shoulder ABduction  182 Degrees    Left Shoulder External Rotation  73 Degrees                  [facility] Adult PT Treatment/Exercise - 12/05/17 0001      Elbow Exercises   Elbow Flexion  Strengthening;Right;Seated;20 reps eccentric    Bar Weights/Barbell (Elbow Flexion)  Other (comment) 7 lbs.  Shoulder Exercises: Prone   Other Prone Exercises  Hughston exercises for right shouler: 15 reps  for all six exercises with 2 lb. weight in hand therapist working with pt. re: not increasing wt. yet    Other Prone Exercises  Is,Ys, and Ts x 10 each  Is with arms down at sides today      Cryotherapy   Number Minutes Cryotherapy  2 Minutes    Cryotherapy Location  Shoulder right biceps tendon area of tenderness    Type of Cryotherapy  Ice massage      Manual Therapy   Passive ROM  right shoulder into flexion and er to tolerance    Other Manual Therapy  cross friction massage to right biceps tendon area of tenderness (performed today by Shan Levans, PT)                  PT Long Term Goals -  12/05/17 1520      PT LONG TERM GOAL #1   Title  Pt. will be independent in HEP for shoulder ROM.    Status  Achieved      PT LONG TERM GOAL #2   Title  Pt. will be knowledgeable about lymphedema risk reduction    Status  Achieved      PT LONG TERM GOAL #3   Title  Pt. will report at least 50% decreased pain inferior to left breast with raising left arm    Status  Achieved      PT LONG TERM GOAL #4   Title  Quick DASH score reduced to 10 or below    Baseline  20 on eval and on 10/17/17, though she reports she does note improvement; 13.64 on 11/28/17    Status  Partially Met      PT LONG TERM GOAL #5   Title  Pt. will report at least 60% decrease in right shoulder pain with moving the right arm into painful positions that she had noted in the past.    Baseline  60-70% improved as of 11/19/17; "It's better.  The right arm is still tighter." 12/05/17: 85-90% better in her daily routine--perhaps not for things like an exercise class.    Status  Achieved            Plan - 12/05/17 1717    Clinical Impression Statement  Pt. again had an inflammatory reaction to iontophoresis done at last session, so this was not repeated today.  She reports 85-90% improvement in right shoulder comfort, although she has not yet returned to full activity.  She has a home exercise program established.  We decided she would be discharged today; she knows she could return for therapy again if needed.  We briefly discussed whether she might have a right shoulder evaluation by an orthpedist, but she does not want to pursue that right now.    Rehab Potential  Excellent    Clinical Impairments Affecting Rehab Potential  h/o right shoulder pain, ongoing    PT Frequency  2x / week    PT Duration  4 weeks    PT Treatment/Interventions  ADLs/Self Care Home Management;Electrical Stimulation;DME Instruction;Therapeutic exercise;Patient/family education;Orthotic Fit/Training;Manual techniques;Scar mobilization;Passive  range of motion;Iontophoresis 50m/ml Dexamethasone    PT Next Visit Plan  Discharge today.    PT Home Exercise Plan  Hughston rotator cuff strengthening    Consulted and Agree with Plan of Care  Patient       Patient will benefit from skilled therapeutic intervention  in order to improve the following deficits and impairments:  Decreased range of motion, Increased fascial restricitons, Pain, Decreased knowledge of precautions, Decreased knowledge of use of DME  Visit Diagnosis: Stiffness of right shoulder, not elsewhere classified  Right shoulder pain, unspecified chronicity  Other symptoms and signs involving the musculoskeletal system  Stiffness of left shoulder, not elsewhere classified     Problem List Patient Active Problem List   Diagnosis Date Noted  . Malignant neoplasm of upper-outer quadrant of left breast in female, estrogen receptor positive ([place]) 08/29/2017  . Genetic testing 07/31/2017  . Malignant neoplasm of upper-inner quadrant of right breast in female, estrogen receptor positive ([place]) 07/24/2017  . Family history of breast cancer   . Family history of prostate cancer     Jayci Ellefson 12/05/2017, [place], [place], [address] Phone: [phone]   Fax:  [phone]  Name: Marie Castro MRN: [id number] Date of Birth: 24-May-1971  PHYSICAL THERAPY DISCHARGE SUMMARY  Visits from Start of Care: 15  Current functional level related to goals / functional outcomes: Goals met, for the most part, as noted above.   Remaining deficits: She continues to have some right shoulder pain and may indeed have more going on there, such as arthritis.  It has improved significantly, but still limits her a little bit.   Education / Equipment: Home exercise program for shoulder strengthening and ROM. Plan: Patient agrees to discharge.  Patient goals were met. Patient is being  discharged due to being pleased with the current functional level.  ?????    Serafina Royals, PT 12/05/17 5:25 PM

## 2017-12-21 ENCOUNTER — Encounter: Payer: Self-pay | Admitting: Oncology

## 2017-12-24 ENCOUNTER — Encounter: Payer: Self-pay | Admitting: Radiation Oncology

## 2017-12-24 ENCOUNTER — Ambulatory Visit
Admission: RE | Admit: 2017-12-24 | Discharge: 2017-12-24 | Disposition: A | Discharge status: Home / Self Care | Payer: BC Managed Care – PPO | Source: Ambulatory Visit | Attending: Radiation Oncology | Admitting: Radiation Oncology

## 2017-12-24 DIAGNOSIS — Z79899 Other long term (current) drug therapy: Secondary | ICD-10-CM | POA: Insufficient documentation

## 2017-12-24 DIAGNOSIS — L818 Other specified disorders of pigmentation: Secondary | ICD-10-CM | POA: Insufficient documentation

## 2017-12-24 DIAGNOSIS — C50912 Malignant neoplasm of unspecified site of left female breast: Secondary | ICD-10-CM | POA: Insufficient documentation

## 2017-12-24 DIAGNOSIS — Z79890 Hormone replacement therapy (postmenopausal): Secondary | ICD-10-CM | POA: Diagnosis not present

## 2017-12-24 DIAGNOSIS — Z923 Personal history of irradiation: Secondary | ICD-10-CM | POA: Insufficient documentation

## 2017-12-24 DIAGNOSIS — Z170 Estrogen receptor positive status [ER+]: Secondary | ICD-10-CM

## 2017-12-24 DIAGNOSIS — Z885 Allergy status to narcotic agent status: Secondary | ICD-10-CM | POA: Insufficient documentation

## 2017-12-24 DIAGNOSIS — C50911 Malignant neoplasm of unspecified site of right female breast: Secondary | ICD-10-CM | POA: Insufficient documentation

## 2017-12-24 DIAGNOSIS — C50412 Malignant neoplasm of upper-outer quadrant of left female breast: Secondary | ICD-10-CM

## 2017-12-24 DIAGNOSIS — C50211 Malignant neoplasm of upper-inner quadrant of right female breast: Secondary | ICD-10-CM

## 2017-12-24 NOTE — Progress Notes (Signed)
Radiation Oncology         [phone] ________________________________  Name: TAUNA MACFARLANE MRN: [id number]  Date of Service: 12/24/2017  DOB: 10-08-1971  Post Treatment Note  CC: [name], PA-C  Magrinat, [name], MD  Diagnosis: 47 y.o. 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 During treatment she did very well with radiotherapy and did not have significant desquamation.                             On review of systems, the patient states she's doing well and her skin is recovering well from completion of treatment. She's tolerating tamoxifen well at this time and no other concerns are verbalized.  ALLERGIES:  is allergic to oxycodone and latex.  Meds: Current Outpatient Medications  Medication Sig Dispense Refill  . diphenhydrAMINE (BENADRYL) 12.5 MG/5ML liquid Take 37.5 mg by mouth at bedtime as needed for sleep.    Marland Kitchen ibuprofen (ADVIL,MOTRIN) 200 MG tablet Take 400-600 mg by mouth daily as needed for headache or moderate pain.    Marland Kitchen levothyroxine (SYNTHROID, LEVOTHROID) 100 MCG tablet Take 100 mcg by mouth daily before breakfast.    . LORazepam (ATIVAN) 0.5 MG tablet Take 0.5 mg by mouth 2 (two) times daily as needed for anxiety.    . Multiple Vitamins-Minerals (MULTIVITAMIN GUMMIES WOMENS) CHEW Chew 2 tablets by mouth daily.    . tamoxifen (NOLVADEX) 20 MG tablet Take 20 mg daily by mouth.    . ondansetron (ZOFRAN-ODT) 8 MG disintegrating tablet Take 1 tablet (8 mg total) by mouth every 8 (eight) hours as needed for nausea or vomiting. (Patient not taking: Reported on 12/24/2017) 20 tablet 2  . SUMAtriptan (IMITREX) 50 MG tablet Take 1 tablet (50 mg total) by  mouth every 2 (two) hours as needed for migraine. May repeat in 2 hours if headache persists or recurs. (Patient not taking: Reported on 12/24/2017) 10 tablet 2  . traMADol (ULTRAM) 50 MG tablet Take every 6 (six) hours as needed by mouth.     No current facility-administered medications for this encounter.     Physical Findings:  height is 5\' 6"  (1.676 m) and weight is 153 lb 12.8 oz (69.8 kg). Her oral temperature is 98.4 F (36.9 C). Her blood pressure is 121/72 and her pulse is 75. Her oxygen saturation is 100%.  Pain Assessment Pain Score: 0-No pain/10 In general this is a well appearing caucasian female in no acute distress. She's alert and oriented x4 and appropriate throughout the examination. Cardiopulmonary assessment is negative for acute distress and she exhibits normal effort. Bilateral breasts are examined and reveal mild hyperpigmentation of both breasts with no desquamation. Along the base of the nipples there are some areas of dry skin but no candidate like changes.   Lab Findings: Lab Results  Component Value Date   WBC 4.6 11/13/2017   HGB 13.7 08/10/2017   HCT 37.2 11/13/2017   MCV 87.0 11/13/2017   PLT 227 11/13/2017     Radiographic Findings: No results found.  Impression/Plan: 1. 47 y.o. female with pTis low grade  ductal carcinoma of the left breast and pT1c, pN0 invasive lobular carcinoma of the right breast. The patient has been doing well since completion of radiotherapy. We discussed that we would be happy to continue to follow her as needed, but she will also continue to follow up with Dr. Jana Hakim in medical oncology. She was counseled on skin care as well as measures to avoid sun exposure to this area.  2. Survivorship. She will be referred to survivorship clinic at the appropriate interval per Dr. Jana Hakim and was given information regarding resources here at the cancer center.     Carola Rhine, PAC

## 2017-12-24 NOTE — Progress Notes (Signed)
Marie Castro is here for follow up after treatment to her bil breasts.  She reports having some discomfort in her left nipple area.  She is taking tamoxifen.  She denies having any fatigue.  The skin on both breasts have hyperpigmentation.  BP 121/72 (BP Location: Left Arm, Patient Position: Sitting)   Pulse 75   Temp 98.4 F (36.9 C) (Oral)   Ht 5\' 6"  (1.676 m)   Wt 153 lb 12.8 oz (69.8 kg)   SpO2 100%   BMI 24.82 kg/m    Wt Readings from Last 3 Encounters:  12/24/17 153 lb 12.8 oz (69.8 kg)  11/13/17 152 lb 6.4 oz (69.1 kg)  11/02/17 152 lb 12.8 oz (69.3 kg)

## 2017-12-26 ENCOUNTER — Telehealth: Payer: Self-pay | Admitting: *Deleted

## 2017-12-26 NOTE — Telephone Encounter (Signed)
CALLED PATIENT TO INFORM OF FU ON 07-29-18 @ 3 PM WITH DR. KINARD, SPOKE WITH PATIENT AND SHE IS AWARE OF THIS APPT.

## 2018-01-24 ENCOUNTER — Encounter (HOSPITAL_COMMUNITY): Payer: Self-pay | Admitting: Family Medicine

## 2018-01-24 ENCOUNTER — Ambulatory Visit (HOSPITAL_COMMUNITY)
Admission: EM | Admit: 2018-01-24 | Discharge: 2018-01-24 | Disposition: A | Discharge status: Home / Self Care | Payer: BC Managed Care – PPO | Attending: Family Medicine | Admitting: Family Medicine

## 2018-01-24 DIAGNOSIS — G43019 Migraine without aura, intractable, without status migrainosus: Secondary | ICD-10-CM | POA: Diagnosis not present

## 2018-01-24 DIAGNOSIS — R110 Nausea: Secondary | ICD-10-CM | POA: Diagnosis not present

## 2018-01-24 MED ORDER — SUMATRIPTAN SUCCINATE 6 MG/0.5ML ~~LOC~~ SOLN
SUBCUTANEOUS | Status: AC
  Filled 2018-01-24: qty 0.5

## 2018-01-24 MED ORDER — SUMATRIPTAN SUCCINATE 6 MG/0.5ML ~~LOC~~ SOLN
6.0000 mg | Freq: Once | SUBCUTANEOUS | Status: AC
  Administered 2018-01-24: 6 mg via SUBCUTANEOUS

## 2018-01-24 MED ORDER — KETOROLAC TROMETHAMINE 60 MG/2ML IM SOLN
60.0000 mg | Freq: Once | INTRAMUSCULAR | Status: AC
  Administered 2018-01-24: 60 mg via INTRAMUSCULAR

## 2018-01-24 MED ORDER — DEXAMETHASONE SODIUM PHOSPHATE 10 MG/ML IJ SOLN
10.0000 mg | Freq: Once | INTRAMUSCULAR | Status: AC
  Administered 2018-01-24: 10 mg via INTRAMUSCULAR

## 2018-01-24 MED ORDER — DEXAMETHASONE 10 MG/ML FOR PEDIATRIC ORAL USE
INTRAMUSCULAR | Status: AC
  Filled 2018-01-24: qty 1

## 2018-01-24 MED ORDER — METOCLOPRAMIDE HCL 5 MG/ML IJ SOLN
INTRAMUSCULAR | Status: AC
  Filled 2018-01-24: qty 2

## 2018-01-24 MED ORDER — KETOROLAC TROMETHAMINE 60 MG/2ML IM SOLN
INTRAMUSCULAR | Status: AC
  Filled 2018-01-24: qty 2

## 2018-01-24 MED ORDER — METOCLOPRAMIDE HCL 5 MG/ML IJ SOLN
5.0000 mg | Freq: Once | INTRAMUSCULAR | Status: AC
  Administered 2018-01-24: 10 mg via INTRAMUSCULAR

## 2018-01-24 NOTE — Discharge Instructions (Signed)
Toradol, Reglan, Decadron, Imitrex injection in office today. Keep hydrated, your urine should be clear to pale yellow in color.  Follow-up with PCP for further evaluation if your migraines become more frequent.  Monitor for any worsening of symptoms, weakness, dizziness, passing out, imbalance, confusion/altered mental status, go to the emergency department for further evaluation.

## 2018-01-24 NOTE — ED Provider Notes (Signed)
[place]    CSN: [id number] Arrival date & time: 01/24/18  1940     History   Chief Complaint Chief Complaint  Patient presents with  . Migraine    HPI Marie Castro is a 47 y.o. female.   47 year old female comes in for 4-day history of migraine.  States first started on the right side that is pounding in nature, she took Imitrex and ibuprofen with good relief.  Symptoms returned the next day during a trip to [place], she again took Imitrex with some relief.  States since yesterday, pain has continued, took 2 tramadol without relief.  Took Imitrex without relief.  She has photophobia with phonophobia.  Nausea without vomiting.  Denies weakness, dizziness, syncope.  Denies chest pain, shortness of breath, wheezing, palpitations.  States recently diagnosed with migraines, has not pinpointed her triggers yet.     Past Medical History:  Diagnosis Date  . Anxiety   . Cancer [facility])    breast  . Family history of breast cancer   . Family history of heart disease   . Family history of prostate cancer   . History of radiation therapy 10/04/17-11/22/17   left breast 50.4 Gy in 28 fractions, left breast boost 10 Gy in 5 fractions, right breast 50.4 Gy in 28 fractions, right breast boost 12 Gy in 6 fractions  . Hypothyroidism   . Thyroid disease     Patient Active Problem List   Diagnosis Date Noted  . Malignant neoplasm of upper-outer quadrant of left breast in female, estrogen receptor positive ([place]) 08/29/2017  . Genetic testing 07/31/2017  . Malignant neoplasm of upper-inner quadrant of right breast in female, estrogen receptor positive ([place]) 07/24/2017  . Family history of breast cancer   . Family history of prostate cancer     Past Surgical History:  Procedure Laterality Date  . BREAST BIOPSY Right 07/12/2017  . BREAST LUMPECTOMY WITH RADIOACTIVE SEED AND SENTINEL LYMPH NODE BIOPSY Right 08/16/2017   Procedure: RIGHT BREAST LUMPECTOMY WITH RADIOACTIVE SEED AND RIGHT  SENTINEL LYMPH NODE BIOPSY;  Surgeon: [name], MD;  Location: [place];  Service: General;  Laterality: Right;  . BREAST LUMPECTOMY WITH RADIOACTIVE SEED LOCALIZATION Left 08/16/2017   Procedure: LEFT BREAST PARTIAL MASTECTOMY WITH BRACKETED RADIOACTIVE SEED LOCALIZATION;  Surgeon: [name], MD;  Location: [place];  Service: General;  Laterality: Left;  . DILATION AND CURETTAGE OF UTERUS    . KNEE SURGERY    . MULTIPLE TOOTH EXTRACTIONS    . RE-EXCISION OF BREAST LUMPECTOMY Left 08/27/2017   Procedure: LEFT RE-EXCISION OF BREAST LUMPECTOMY ERAS PATHWAY;  Surgeon: [name], MD;  Location: [place];  Service: General;  Laterality: Left;  . WISDOM TOOTH EXTRACTION      OB History    Gravida  3   Para  2   Term      Preterm  0   AB      Living  2     SAB      TAB      Ectopic      Multiple      Live Births               Home Medications    Prior to Admission medications   Medication Sig Start Date End Date Taking? Authorizing Provider  diphenhydrAMINE (BENADRYL) 12.5 MG/5ML liquid Take 37.5 mg by mouth at bedtime as needed for sleep.    [provider]  ibuprofen (ADVIL,MOTRIN) 200  MG tablet Take 400-600 mg by mouth daily as needed for headache or moderate pain.    [provider]  levothyroxine (SYNTHROID, LEVOTHROID) 100 MCG tablet Take 100 mcg by mouth daily before breakfast.    [provider]  LORazepam (ATIVAN) 0.5 MG tablet Take 0.5 mg by mouth 2 (two) times daily as needed for anxiety.    [provider]  Multiple Vitamins-Minerals (MULTIVITAMIN GUMMIES WOMENS) CHEW Chew 2 tablets by mouth daily.    [provider]  ondansetron (ZOFRAN-ODT) 8 MG disintegrating tablet Take 1 tablet (8 mg total) by mouth every 8 (eight) hours as needed for nausea or vomiting. Patient not taking: Reported on 12/24/2017 11/02/17   Harle Stanford., PA-C  SUMAtriptan (IMITREX) 50 MG tablet Take 1 tablet (50 mg  total) by mouth every 2 (two) hours as needed for migraine. May repeat in 2 hours if headache persists or recurs. Patient not taking: Reported on 12/24/2017 11/02/17   Harle Stanford., PA-C  tamoxifen (NOLVADEX) 20 MG tablet Take 20 mg daily by mouth.    [provider]  traMADol (ULTRAM) 50 MG tablet Take every 6 (six) hours as needed by mouth.    [provider]    Family History Family History  Problem Relation Age of Onset  . Cancer Mother        SKIN AND BREAST  . Heart disease Mother   . Heart disease Father   . Hyperlipidemia Father   . Depression Father   . Cancer Maternal Grandmother        BREAST  . Melanoma Maternal Grandmother        toe  . Cancer Maternal Grandfather        PROSTATE  . Irritable bowel syndrome Paternal Grandmother   . Heart disease Paternal Grandfather   . Breast cancer Maternal Aunt 72  . Breast cancer Maternal Aunt        dx in her 3s    Social History Social History   Tobacco Use  . Smoking status: Never Smoker  . Smokeless tobacco: Never Used  Substance Use Topics  . Alcohol use: No  . Drug use: No     Allergies   Oxycodone and Latex   Review of Systems Review of Systems  Reason unable to perform ROS: See HPI as above.     Physical Exam Triage Vital Signs ED Triage Vitals [01/24/18 2029]  Enc Vitals Group     BP 125/79     Pulse Rate 79     Resp 18     Temp 98.6 F (37 C)     Temp src      SpO2 99 %     Weight      Height      Head Circumference      Peak Flow      Pain Score      Pain Loc      Pain Edu?      Excl. in [place]?    No data found.  Updated Vital Signs BP 125/79   Pulse 79   Temp 98.6 F (37 C)   Resp 18   SpO2 99%   Physical Exam  Constitutional: She is oriented to person, place, and time. She appears well-developed and well-nourished. No distress.  Patient lying on exam table.   HENT:  Head: Normocephalic and atraumatic.  Eyes: Pupils are equal, round, and reactive to  light. Conjunctivae are normal.  Pulmonary/Chest: Effort normal. No  respiratory distress.  Neurological: She is alert and oriented to person, place, and time. She is not disoriented. Coordination and gait normal. GCS eye subscore is 4. GCS verbal subscore is 5. GCS motor subscore is 6.  Skin: Skin is warm and dry. She is not diaphoretic.    UC Treatments / Results  Labs (all labs ordered are listed, but only abnormal results are displayed) Labs Reviewed - No data to display  EKG None Radiology No results found.  Procedures Procedures (including critical care time)  Medications Ordered in UC Medications  ketorolac (TORADOL) injection 60 mg (60 mg Intramuscular Given 01/24/18 2049)  metoCLOPramide (REGLAN) injection 5 mg (10 mg Intramuscular Given 01/24/18 2049)  dexamethasone (DECADRON) injection 10 mg (10 mg Intramuscular Given 01/24/18 2048)  SUMAtriptan (IMITREX) injection 6 mg (6 mg Subcutaneous Given 01/24/18 2049)     Initial Impression / Assessment and Plan / UC Course  I have reviewed the triage vital signs and the nursing notes.  Pertinent labs & imaging results that were available during my care of the patient were reviewed by me and considered in my medical decision making (see chart for details).     Toradol, Reglan, Decadron, Imitrex injection in office today.  Given patient with history of migraine, currently with photophobia, avoided for neurology exam.  Patient alert and orientated, without symptoms of weakness, dizziness, confusion, syncope.  Will have patient monitor closely for the symptoms.  Return precautions given.  Patient expresses understanding and agrees to plan.  Final Clinical Impressions(s) / UC Diagnoses   Final diagnoses:  Intractable migraine without aura and without status migrainosus    ED Discharge Orders    None        Arturo Morton 01/24/18 2102

## 2018-01-24 NOTE — ED Triage Notes (Signed)
Pt here for migraine since Sunday. She took Imitrex, ibuprofen and tramadol. She is having nausea, light sensitivity.

## 2018-05-03 ENCOUNTER — Telehealth: Payer: Self-pay | Admitting: *Deleted

## 2018-05-03 NOTE — Telephone Encounter (Signed)
This RN spoke with pt per her call inquiring " could the tamoxifen cause hives and a rash ?"  Marie Castro states she developed hives/rash with itching on her legs that then progressed body wide except for her head.  She was seen by her primary MD twice - given cream and a steroid shot with no improvement. She then obtained a 2nd medrol shot with no improvement.  She was away on vacation and went to an Urgent Care and obtained 12 day medrol taper dose and atarax which did help - she completed the taper dose this past Tuesday.  Rash and hives began to recur - she went to a dermatologist and has been given a stronger atarax, tagament and xyzal prescriptions.  Marie Castro states the only other change is she had to increase the use of Imitrex- which is now being changed to another drug.  Per above discussion this RN informed pt - difficult to say symptoms are related to the tamoxifen.  Plan given to hold the tamoxifen for 1 week - with plan to restart once symptoms are gone- if rash then recurs it would be more known as cause and further recommendations can be given.  Marie Castro verbalized understanding as well as to call this RN next Friday with update.

## 2018-05-07 ENCOUNTER — Telehealth: Payer: Self-pay | Admitting: *Deleted

## 2018-05-07 NOTE — Telephone Encounter (Signed)
This RN returned call to pt per her call to give status update per hold tamoxifen due to ongoing rash.  Lynna states she has had continued itching since stopping the tamoxifen - she has been described another 7 day steroid taper .  Plan per above discussion - Marie Castro will stay off the tamoxifen until she sees Dr Jannifer Rodney on 8/12 .  Of note Gissella states she did have some minimal itching in the winter post starting the tamoxifen- " but then really didn't think much of it until this itching started "  No further needs at this time.

## 2018-05-17 NOTE — Progress Notes (Signed)
[place]  [phone] [phone]     ID: Marie Castro DOB: [birth year]  MR#: [id number]  GUR#:[id number]  Patient Care Team: [name], PA-C as PCP - General (Nurse Practitioner) Magrinat, [name], MD as Consulting Physician (Oncology) [name], MD as Consulting Physician (General Surgery) [name], MD as Consulting Physician (Radiation Oncology) [name], PA-C as Physician Assistant (Obstetrics and Gynecology) Register, Kinsley, PA-C as Physician Assistant (Dermatology) Tiajuana Amass, MD as Referring Physician (Allergy and Immunology) OTHER MD:  CHIEF COMPLAINT: Invasive lobular breast cancer, estrogen receptor positive  CURRENT TREATMENT: [tamoxifen]   HISTORY OF CURRENT ILLNESS: From the original intake note:  [name] had bilateral screening mammography at [facility] 07/06/2017, showing a new 1.5 cm oval mass in the right breast upper inner quadrant, a possible development asymmetry in the left breast upper outer quadrant, and a 0.3 cm area of calcifications in the left breast upper outer quadrant. On 07/11/2017 the patient underwent left diagnostic mammography with tomography and bilateral breast ultrasonography. The left breast mammography showed the breast density to be category C. In the upper-outer quadrant of the left breast there was a 0.8 cm oval mass which by ultrasound was a benign complicated cyst. There was also a 0.3 cm area of grouped calcifications in the left breast upper outer quadrant.  In the right breast, ultrasonography showed an irregular mass in the upper inner quadrant adjacent to a benign 1.5 cm simple cyst.  On 07/12/2017 she underwent bilateral biopsies. On the right there was an invasive lobular carcinoma, grade 1 or 2, estrogen receptor 80% positive with moderate staining intensity, progesterone receptor 95% positive with strong staining intensity, with an MIB-1 of 1%, and no HER-2 amplification, the signals  ratio being 1.12 and the number per cell 1.85. ([id number])  On the left the biopsy showed atypical lobular hyperplasia.  Both axillae were sonographically benign  The patient's subsequent history is as detailed below.  INTERVAL HISTORY: Marie Castro returns today for follow-up and treatment of her estrogen receptor positive breast cancer accompanied by her husband. She discontinued taking tamoxifen due to hives.  She was on a trip to [place], [place], in [place], which she says was wonderful and full of good experiences.  They did go swimming in a river, and after that she started having itching problems.  No one else that swam there had those problems.  This became total body hives, with severe itching.  She never had a fever.  She was seen in several urgent care clinics.  She received prednisone shots or tapers 4 times in the last 5 weeks.. She received another prednisone shots and received a medrol dose pack in [place]. She arrived in [place] on 04/20/2018. She notes that the hives were worse and spread to her arms, legs, back. She was given a prednisone dose pack, Aterax, and Zyrtek. The rash had resolved after this. She notes that this was not contact or environmentally related.  She ended the prednisone dose pack on 05/01/2018, and the hives returned 05/03/2018. She stopped the tamoxifen on 05/03/2018. She started taking Xyzal and another prednisone pack under her dermatologist's care. She had nausea and a headache on 05/18/2018.   She also tried taking Mucinex and Valihist. Currently, she has itchy bumps on her chest and itchy redness on her left leg.  She also has a severe persistent cough.  This is nonproductive.  It does not allow her to sleep.    REVIEW OF SYSTEMS: Marie Castro reports that she  had pink eye in both eyes last week. She notes that this turned into laryngitis and a painful cough. She also has congestion. She feels weak and exhausted. She has not been sleeping well. She denies  unusual headaches, visual changes, nausea, vomiting, or dizziness. There has been no unusual cough, phlegm production, or pleurisy. This been no change in bowel or bladder habits. She denies unexplained fatigue or unexplained weight loss, bleeding, or fever. A detailed review of systems was otherwise stable.    PAST MEDICAL HISTORY: Past Medical History:  Diagnosis Date  . Anxiety   . Cancer [facility])    breast  . Family history of breast cancer   . Family history of heart disease   . Family history of prostate cancer   . History of radiation therapy 10/04/17-11/22/17   left breast 50.4 Gy in 28 fractions, left breast boost 10 Gy in 5 fractions, right breast 50.4 Gy in 28 fractions, right breast boost 12 Gy in 6 fractions  . Hypothyroidism   . Thyroid disease     PAST SURGICAL HISTORY: Past Surgical History:  Procedure Laterality Date  . BREAST BIOPSY Right 07/12/2017  . BREAST LUMPECTOMY WITH RADIOACTIVE SEED AND SENTINEL LYMPH NODE BIOPSY Right 08/16/2017   Procedure: RIGHT BREAST LUMPECTOMY WITH RADIOACTIVE SEED AND RIGHT SENTINEL LYMPH NODE BIOPSY;  Surgeon: [name], MD;  Location: [place];  Service: General;  Laterality: Right;  . BREAST LUMPECTOMY WITH RADIOACTIVE SEED LOCALIZATION Left 08/16/2017   Procedure: LEFT BREAST PARTIAL MASTECTOMY WITH BRACKETED RADIOACTIVE SEED LOCALIZATION;  Surgeon: [name], MD;  Location: [place];  Service: General;  Laterality: Left;  . DILATION AND CURETTAGE OF UTERUS    . KNEE SURGERY    . MULTIPLE TOOTH EXTRACTIONS    . RE-EXCISION OF BREAST LUMPECTOMY Left 08/27/2017   Procedure: LEFT RE-EXCISION OF BREAST LUMPECTOMY ERAS PATHWAY;  Surgeon: [name], MD;  Location: [place];  Service: General;  Laterality: Left;  . WISDOM TOOTH EXTRACTION      FAMILY HISTORY Family History  Problem Relation Age of Onset  . Cancer Mother        SKIN AND BREAST  . Heart disease Mother   . Heart disease Father   . Hyperlipidemia  Father   . Depression Father   . Cancer Maternal Grandmother        BREAST  . Melanoma Maternal Grandmother        toe  . Cancer Maternal Grandfather        PROSTATE  . Irritable bowel syndrome Paternal Grandmother   . Heart disease Paternal Grandfather   . Breast cancer Maternal Aunt 72  . Breast cancer Maternal Aunt        dx in her 45s  The patient's father is 28 year old and her mother 89 year old as of October 2018. The patient has one sister, no brothers. The patient's mother and mother's mother both had breast cancer in their 64s. A maternal aunt had breast cancer at age 56, and another at age 66. The patient's mother has had genetics testing, which was negative. Please also consulted the detailed genetics pedigree in [place]:  No LMP recorded. Menarche age 55, first live birth age 73, she is still premenopausal, with regular periods lasting approximately 5 days, of which 3 are heavy. She used oral contraceptives approximately 2 years with no complications and also had a subcutaneous depot contraceptive briefly. 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 Her husband Hospital doctor ("Chip") is a Control and instrumentation engineer for [employer]. Their son, Trace, is 30 and daughter [name] 32 as of October 2018. The patient attends the [employer] DIRECTIVES:    HEALTH MAINTENANCE: Social History   Tobacco Use  . Smoking status: Never Smoker  . Smokeless tobacco: Never Used  Substance Use Topics  . Alcohol use: No  . Drug use: No     Colonoscopy: Never  PAP:  Bone density: Never   Allergies  Allergen Reactions  . Oxycodone Other (See Comments)    Pt states could not sleep at all, was up 24 hrs  . Latex Rash    Pt states rubber bands from her braces causes ulcers in her mouth    Current Outpatient  Medications  Medication Sig Dispense Refill  . cholecalciferol (VITAMIN D) 1000 units tablet Take 1 tablet (1,000 Units total) by mouth daily. 100 tablet 4  . diphenhydrAMINE (BENADRYL) 12.5 MG/5ML liquid Take 37.5 mg by mouth at bedtime as needed for sleep.    Marland Kitchen ibuprofen (ADVIL,MOTRIN) 200 MG tablet Take 400-600 mg by mouth daily as needed for headache or moderate pain.    Marland Kitchen levothyroxine (SYNTHROID, LEVOTHROID) 100 MCG tablet Take 100 mcg by mouth daily before breakfast.    . LORazepam (ATIVAN) 0.5 MG tablet Take 0.5 mg by mouth 2 (two) times daily as needed for anxiety.    . Multiple Vitamins-Minerals (MULTIVITAMIN GUMMIES WOMENS) CHEW Chew 2 tablets by mouth daily.    . ondansetron (ZOFRAN-ODT) 8 MG disintegrating tablet Take 1 tablet (8 mg total) by mouth every 8 (eight) hours as needed for nausea or vomiting. (Patient not taking: Reported on 12/24/2017) 20 tablet 2  . SUMAtriptan (IMITREX) 50 MG tablet Take 1 tablet (50 mg total) by mouth every 2 (two) hours as needed for migraine. May repeat in 2 hours if headache persists or recurs. (Patient not taking: Reported on 12/24/2017) 10 tablet 2  . tamoxifen (NOLVADEX) 20 MG tablet Take 20 mg daily by mouth.    . traMADol (ULTRAM) 50 MG tablet Take every 6 (six) hours as needed by mouth.     No current facility-administered medications for this visit.     OBJECTIVE: Young white woman who coughed multiple times during today's visit  Vitals:   05/20/18 1443  BP: 104/67  Pulse: 90  Resp: 18  Temp: 99.4 F (37.4 C)  SpO2: 100%     Body mass index is 25.7 kg/m.   Wt Readings from Last 3 Encounters:  05/20/18 159 lb 3.2 oz (72.2 kg)  12/24/17 153 lb 12.8 oz (69.8 kg)  11/13/17 152 lb 6.4 oz (69.1 kg)      ECOG FS:1 - Symptomatic but completely ambulatory  Sclerae unicteric, no pinkeye, pupils round and equal Oropharynx clear and moist, no thrush or lesions No cervical or supraclavicular adenopathy Lungs no rales or rhonchi--excellent  excursion bilaterally and no dullness to percussion Heart regular rate and rhythm Abd soft, nontender, positive bowel sounds, no palpable splenomegaly MSK no focal spinal tenderness, no upper extremity lymphedema Neuro: nonfocal, well oriented, appropriate affect Breasts: Status post bilateral lumpectomies and bilateral radiation.  The cosmetic result is excellent.  There is no evidence of disease recurrence.  Both axillae are benign.  LAB RESULTS:  CMP     Component Value Date/Time   NA 142 05/20/2018 1421  K 4.9 05/20/2018 1421   CL 105 05/20/2018 1421   CO2 28 05/20/2018 1421   GLUCOSE 93 05/20/2018 1421   BUN 11 05/20/2018 1421   CREATININE 0.83 05/20/2018 1421   CREATININE 0.81 11/13/2017 1512   CALCIUM 9.4 05/20/2018 1421   PROT 7.5 05/20/2018 1421   ALBUMIN 3.6 05/20/2018 1421   AST 20 05/20/2018 1421   AST 16 11/13/2017 1512   ALT 31 05/20/2018 1421   ALT 13 11/13/2017 1512   ALKPHOS 70 05/20/2018 1421   BILITOT 0.4 05/20/2018 1421   BILITOT 0.3 11/13/2017 1512   GFRNONAA >60 05/20/2018 1421   GFRNONAA >60 11/13/2017 1512   GFRAA >60 05/20/2018 1421   GFRAA >60 11/13/2017 1512    No results found for: TOTALPROTELP, ALBUMINELP, A1GS, A2GS, BETS, BETA2SER, GAMS, MSPIKE, SPEI  No results found for: Nils Pyle, [facility]  Lab Results  Component Value Date   WBC 12.0 (H) 05/20/2018   NEUTROABS 9.1 (H) 05/20/2018   HGB 13.3 05/20/2018   HCT 40.4 05/20/2018   MCV 89.4 05/20/2018   PLT 309 05/20/2018      Chemistry      Component Value Date/Time   NA 142 05/20/2018 1421   K 4.9 05/20/2018 1421   CL 105 05/20/2018 1421   CO2 28 05/20/2018 1421   BUN 11 05/20/2018 1421   CREATININE 0.83 05/20/2018 1421   CREATININE 0.81 11/13/2017 1512      Component Value Date/Time   CALCIUM 9.4 05/20/2018 1421   ALKPHOS 70 05/20/2018 1421   AST 20 05/20/2018 1421   AST 16 11/13/2017 1512   ALT 31 05/20/2018 1421   ALT 13 11/13/2017 1512   BILITOT  0.4 05/20/2018 1421   BILITOT 0.3 11/13/2017 1512       No results found for: LABCA2  No components found for: RXYVOP929  No results for input(s): INR in the last 168 hours.  No results found for: LABCA2  No results found for: WKM628  No results found for: MNO177  No results found for: NHA579  No results found for: CA2729  No components found for: HGQUANT  No results found for: CEA1 / No results found for: CEA1   No results found for: AFPTUMOR  No results found for: [place]  No results found for: PSA1  Appointment on 05/20/2018  Component Date Value Ref Range Status  . Sodium 05/20/2018 142  135 - 145 mmol/L Final  . Potassium 05/20/2018 4.9  3.5 - 5.1 mmol/L Final  . Chloride 05/20/2018 105  98 - 111 mmol/L Final  . CO2 05/20/2018 28  22 - 32 mmol/L Final  . Glucose, Bld 05/20/2018 93  70 - 99 mg/dL Final  . BUN 05/20/2018 11  6 - 20 mg/dL Final  . Creatinine, Ser 05/20/2018 0.83  0.44 - 1.00 mg/dL Final  . Calcium 05/20/2018 9.4  8.9 - 10.3 mg/dL Final  . Total Protein 05/20/2018 7.5  6.5 - 8.1 g/dL Final  . Albumin 05/20/2018 3.6  3.5 - 5.0 g/dL Final  . AST 05/20/2018 20  15 - 41 U/L Final  . ALT 05/20/2018 31  0 - 44 U/L Final  . Alkaline Phosphatase 05/20/2018 70  38 - 126 U/L Final  . Total Bilirubin 05/20/2018 0.4  0.3 - 1.2 mg/dL Final  . GFR calc non Af Amer 05/20/2018 >60  >60 mL/min Final  . GFR calc Af Amer 05/20/2018 >60  >60 mL/min Final   Comment: (NOTE) The eGFR has been calculated using  the CKD EPI equation. This calculation has not been validated in all clinical situations. eGFR's persistently <60 mL/min signify possible Chronic Kidney Disease.   Georgiann Hahn gap 05/20/2018 9  5 - 15 Final   Performed at [facility], [place] [address], [place], [place] [address]  . WBC 05/20/2018 12.0* 3.9 - 10.3 K/uL Final  . RBC 05/20/2018 4.52  3.70 - 5.45 MIL/uL Final  . Hemoglobin 05/20/2018 13.3  11.6 - 15.9 g/dL Final  . HCT  05/20/2018 40.4  34.8 - 46.6 % Final  . MCV 05/20/2018 89.4  79.5 - 101.0 fL Final  . MCH 05/20/2018 29.4  25.1 - 34.0 pg Final  . MCHC 05/20/2018 32.9  31.5 - 36.0 g/dL Final  . RDW 05/20/2018 13.1  11.2 - 14.5 % Final  . Platelets 05/20/2018 309  145 - 400 K/uL Final  . Neutrophils Relative % 05/20/2018 76  % Final  . Neutro Abs 05/20/2018 9.1* 1.5 - 6.5 K/uL Final  . Lymphocytes Relative 05/20/2018 8  % Final  . Lymphs Abs 05/20/2018 1.0  0.9 - 3.3 K/uL Final  . Monocytes Relative 05/20/2018 13  % Final  . Monocytes Absolute 05/20/2018 1.6* 0.1 - 0.9 K/uL Final  . Eosinophils Relative 05/20/2018 3  % Final  . Eosinophils Absolute 05/20/2018 0.4  0.0 - 0.5 K/uL Final  . Basophils Relative 05/20/2018 0  % Final  . Basophils Absolute 05/20/2018 0.1  0.0 - 0.1 K/uL Final   Performed at [facility], [place] [address] [place], [place] [address]    (this displays the last labs from the last 3 days)  No results found for: TOTALPROTELP, ALBUMINELP, A1GS, A2GS, BETS, BETA2SER, GAMS, MSPIKE, SPEI (this displays SPEP labs)  No results found for: KPAFRELGTCHN, LAMBDASER, KAPLAMBRATIO (kappa/lambda light chains)  No results found for: HGBA, HGBA2QUANT, HGBFQUANT, HGBSQUAN (Hemoglobinopathy evaluation)   No results found for: LDH  No results found for: IRON, TIBC, IRONPCTSAT (Iron and TIBC)  No results found for: FERRITIN  Urinalysis No results found for: COLORURINE, APPEARANCEUR, LABSPEC, PHURINE, GLUCOSEU, HGBUR, BILIRUBINUR, KETONESUR, PROTEINUR, UROBILINOGEN, NITRITE, LEUKOCYTESUR   STUDIES: She will be due for repeat mammography October of this year  ELIGIBLE FOR AVAILABLE RESEARCH PROTOCOL: no  ASSESSMENT: 47 y.o.  woman status post right breast upper inner quadrant biopsy 07/11/2017 for a clinical T1c N0, stage IA invasive lobular carcinoma, E-cadherin negative, grade 1 or 2, estrogen and progesterone receptor positive, HER-2 nonamplified,  with an MIB-1 of 1%  (1) biopsy of left breast upper outer quadrant calcifications 07/11/2017 showed atypical lobular hyperplasia  (a) breast MRI 07/19/2017 shows 2 additional areas of concern in the left breast, with MRI guided biopsy 07/25/2017 showing ductal carcinoma in situ x2, low-grade, estrogen and progesterone receptor strongly positive.  (2) genetics testing 07/27/2017 through the STAT Breast cancer panel offered by [facility] found no deleterious mutations in ATM, BRCA1, BRCA2, CDH1, CHEK2, PALB2, PTEN, STK11 and TP53.   Reflexed to the Multi-Gene Panel offered by [facility] finding no deleterious mutations in ALK, APC, ATM, AXIN2,BAP1,  BARD1, BLM, BMPR1A, BRCA1, BRCA2, BRIP1, CASR, CDC73, CDH1, CDK4, CDKN1B, CDKN1C, CDKN2A (p14ARF), CDKN2A (p16INK4a), CEBPA, CHEK2, CTNNA1, DICER1, DIS3L2, EGFR (c.2369C>T, p.Thr790Met variant only), EPCAM (Deletion/duplication testing only), FH, FLCN, GATA2, GPC3, GREM1 (Promoter region deletion/duplication testing only), HOXB13 (c.251G>A, p.Gly84Glu), HRAS, KIT, MAX, MEN1, MET, MITF (c.952G>A, p.Glu318Lys variant only), MLH1, MSH2, MSH3, MSH6, MUTYH, NBN, NF1, NF2, NTHL1, PALB2, PDGFRA, PHOX2B, PMS2, POLD1, POLE, POT1, PRKAR1A, PTCH1, PTEN, RAD50,  RAD51C, RAD51D, RB1, RECQL4, RET, RUNX1, SDHAF2, SDHA (sequence changes only), SDHB, SDHC, SDHD, SMAD4, SMARCA4, SMARCB1, SMARCE1, STK11, SUFU, TERT, TERT, TMEM127, TP53, TSC1, TSC2, VHL, WRN and WT1.   (3) tamoxifen started neoadjuvantly 07/24/2017, held July 2019 secondary to hives  (4) status post right lumpectomy and sentinel lymph node sampling 08/16/2017 for a pT1c pN0, stage IA invasive lobular carcinoma, grade 2, total of 3 sentinel nodes removed  (5) status post left lumpectomy 08/16/2017 for ductal carcinoma in situ, low-grade, with focally positive margins, Tis NX.  (a) additional surgery for margin clearance 08/27/2017 was successful ([id number])  (6) Oncotype DX score of 12 predicts a 10-year risk of  recurrence outside the breast of 8% if the patient's only systemic therapy is tamoxifen for 5 years.  It also predicts no benefit from chemotherapy.  (7) adjuvant radiation 10/04/2018 to 11/22/2017: 50.4 Gy to the left breast with a 10 Gy boost as well as  50.4 Gy to the right breast with a 12 Gy boost  (8) to continue anti-estrogens a minimum of 5 years, then consider 2 years additional aromatase inhibitor treatment.  PLAN: I do not think the hives are going to be related to tamoxifen but that is a remote possibility and therefore she should stay off that medication.  In fact she should stay off all medications unless absolutely necessary.  That includes ibuprofen.  She is going to go off her multivitamin as well.  She already has an appointment with Dr. Leodis Liverpool, which I think is the very best way to proceed.  In the meantime I wrote her a prescription for Phenergan/codeine syrup today.  She understands that the codeine in particular is famous for causing a rash and itching problem but right now she has to do something to stop the cough.  As far as the itching is concerned in addition to Benadryl cream she can try ice.  My suspicion is that this is going to be a food allergy.  That can be difficult to diagnose  For now she will stay off the tamoxifen until this is all completely cleared.  She will see me again in November.  At that time we will consider reintroducing the drug since she is still premenopausal and therefore not a candidate for anastrozole.  Magrinat, [name], MD  05/20/18 3:51 PM Medical Oncology and Hematology [facility] [address] [place], [place] [address] Tel. [phone]    Fax. [phone]  Marie Castro, am acting as scribe for [name] MD.  I, Lurline Del MD, have reviewed the above documentation for accuracy and completeness, and I agree with the above.

## 2018-05-20 ENCOUNTER — Inpatient Hospital Stay: Payer: BC Managed Care – PPO

## 2018-05-20 ENCOUNTER — Telehealth: Payer: Self-pay | Admitting: Oncology

## 2018-05-20 ENCOUNTER — Inpatient Hospital Stay: Payer: BC Managed Care – PPO | Attending: Oncology | Admitting: Oncology

## 2018-05-20 VITALS — BP 104/67 | HR 90 | Temp 99.4°F | Resp 18 | Ht 66.0 in | Wt 159.2 lb

## 2018-05-20 DIAGNOSIS — E039 Hypothyroidism, unspecified: Secondary | ICD-10-CM | POA: Diagnosis not present

## 2018-05-20 DIAGNOSIS — C50211 Malignant neoplasm of upper-inner quadrant of right female breast: Secondary | ICD-10-CM | POA: Diagnosis not present

## 2018-05-20 DIAGNOSIS — R21 Rash and other nonspecific skin eruption: Secondary | ICD-10-CM | POA: Diagnosis not present

## 2018-05-20 DIAGNOSIS — T7840XA Allergy, unspecified, initial encounter: Secondary | ICD-10-CM | POA: Insufficient documentation

## 2018-05-20 DIAGNOSIS — C50412 Malignant neoplasm of upper-outer quadrant of left female breast: Secondary | ICD-10-CM

## 2018-05-20 DIAGNOSIS — Z923 Personal history of irradiation: Secondary | ICD-10-CM | POA: Diagnosis not present

## 2018-05-20 DIAGNOSIS — R05 Cough: Secondary | ICD-10-CM | POA: Diagnosis not present

## 2018-05-20 DIAGNOSIS — Z8042 Family history of malignant neoplasm of prostate: Secondary | ICD-10-CM | POA: Diagnosis not present

## 2018-05-20 DIAGNOSIS — Z803 Family history of malignant neoplasm of breast: Secondary | ICD-10-CM

## 2018-05-20 DIAGNOSIS — N62 Hypertrophy of breast: Secondary | ICD-10-CM

## 2018-05-20 DIAGNOSIS — Z170 Estrogen receptor positive status [ER+]: Principal | ICD-10-CM

## 2018-05-20 DIAGNOSIS — R0981 Nasal congestion: Secondary | ICD-10-CM

## 2018-05-20 DIAGNOSIS — F419 Anxiety disorder, unspecified: Secondary | ICD-10-CM

## 2018-05-20 LAB — CBC WITH DIFFERENTIAL/PLATELET
BASOS PCT: 0 %
Basophils Absolute: 0.1 10*3/uL (ref 0.0–0.1)
EOS ABS: 0.4 10*3/uL (ref 0.0–0.5)
Eosinophils Relative: 3 %
HCT: 40.4 % (ref 34.8–46.6)
Hemoglobin: 13.3 g/dL (ref 11.6–15.9)
Lymphocytes Relative: 8 %
Lymphs Abs: 1 10*3/uL (ref 0.9–3.3)
MCH: 29.4 pg (ref 25.1–34.0)
MCHC: 32.9 g/dL (ref 31.5–36.0)
MCV: 89.4 fL (ref 79.5–101.0)
MONO ABS: 1.6 10*3/uL — AB (ref 0.1–0.9)
MONOS PCT: 13 %
Neutro Abs: 9.1 10*3/uL — ABNORMAL HIGH (ref 1.5–6.5)
Neutrophils Relative %: 76 %
PLATELETS: 309 10*3/uL (ref 145–400)
RBC: 4.52 MIL/uL (ref 3.70–5.45)
RDW: 13.1 % (ref 11.2–14.5)
WBC: 12 10*3/uL — ABNORMAL HIGH (ref 3.9–10.3)

## 2018-05-20 LAB — COMPREHENSIVE METABOLIC PANEL
ALBUMIN: 3.6 g/dL (ref 3.5–5.0)
ALK PHOS: 70 U/L (ref 38–126)
ALT: 31 U/L (ref 0–44)
ANION GAP: 9 (ref 5–15)
AST: 20 U/L (ref 15–41)
BILIRUBIN TOTAL: 0.4 mg/dL (ref 0.3–1.2)
BUN: 11 mg/dL (ref 6–20)
CALCIUM: 9.4 mg/dL (ref 8.9–10.3)
CO2: 28 mmol/L (ref 22–32)
Chloride: 105 mmol/L (ref 98–111)
Creatinine, Ser: 0.83 mg/dL (ref 0.44–1.00)
GFR calc non Af Amer: >60 mL/min (ref >60)
GLUCOSE: 93 mg/dL (ref 70–99)
Potassium: 4.9 mmol/L (ref 3.5–5.1)
SODIUM: 142 mmol/L (ref 135–145)
TOTAL PROTEIN: 7.5 g/dL (ref 6.5–8.1)

## 2018-05-20 MED ORDER — VITAMIN D 1000 UNITS PO TABS: 1000.0000 [IU] | ORAL_TABLET | Freq: Every day | ORAL | 4 refills | Status: DC

## 2018-05-20 NOTE — Telephone Encounter (Signed)
Gave patient avs report and appointments for November. Date/time ok per patient as 1st available 3:30 pm f/u within the mid November range was 11/27.

## 2018-06-12 ENCOUNTER — Ambulatory Visit
Admission: RE | Admit: 2018-06-12 | Discharge: 2018-06-12 | Disposition: A | Discharge status: Home / Self Care | Payer: BC Managed Care – PPO | Source: Ambulatory Visit | Attending: Physician Assistant | Admitting: Physician Assistant

## 2018-06-12 ENCOUNTER — Other Ambulatory Visit: Payer: Self-pay | Admitting: Physician Assistant

## 2018-06-12 DIAGNOSIS — R05 Cough: Secondary | ICD-10-CM

## 2018-06-12 DIAGNOSIS — R059 Cough, unspecified: Secondary | ICD-10-CM

## 2018-07-29 ENCOUNTER — Other Ambulatory Visit: Payer: Self-pay

## 2018-07-29 ENCOUNTER — Encounter: Payer: Self-pay | Admitting: Radiation Oncology

## 2018-07-29 ENCOUNTER — Ambulatory Visit
Admission: RE | Admit: 2018-07-29 | Discharge: 2018-07-29 | Disposition: A | Discharge status: Home / Self Care | Payer: BC Managed Care – PPO | Source: Ambulatory Visit | Attending: Radiation Oncology | Admitting: Radiation Oncology

## 2018-07-29 VITALS — BP 104/71 | HR 70 | Temp 98.0°F | Resp 18 | Wt 164.8 lb

## 2018-07-29 DIAGNOSIS — Z170 Estrogen receptor positive status [ER+]: Secondary | ICD-10-CM | POA: Diagnosis not present

## 2018-07-29 DIAGNOSIS — Z79890 Hormone replacement therapy (postmenopausal): Secondary | ICD-10-CM | POA: Insufficient documentation

## 2018-07-29 DIAGNOSIS — Z923 Personal history of irradiation: Secondary | ICD-10-CM | POA: Insufficient documentation

## 2018-07-29 DIAGNOSIS — Z79899 Other long term (current) drug therapy: Secondary | ICD-10-CM | POA: Insufficient documentation

## 2018-07-29 DIAGNOSIS — C50412 Malignant neoplasm of upper-outer quadrant of left female breast: Secondary | ICD-10-CM | POA: Diagnosis not present

## 2018-07-29 DIAGNOSIS — C50211 Malignant neoplasm of upper-inner quadrant of right female breast: Secondary | ICD-10-CM | POA: Diagnosis not present

## 2018-07-29 DIAGNOSIS — Z885 Allergy status to narcotic agent status: Secondary | ICD-10-CM | POA: Insufficient documentation

## 2018-07-29 NOTE — Progress Notes (Signed)
[name] is here for a follow-up appointment . Patient denies any pain ,but states  that her breast  Is tender. Patient denies any fatigue. States that her skin is slightly hyperpigmented. States that she developed a rash after treatment on her breast. States that it is better now. Vitals:   07/29/18 1525  BP: 104/71  Pulse: 70  Resp: 18  Temp: 98 F (36.7 C)  TempSrc: Oral  SpO2: 100%  Weight: 164 lb 12.8 oz (74.8 kg)   Wt Readings from Last 3 Encounters:  07/29/18 164 lb 12.8 oz (74.8 kg)  05/20/18 159 lb 3.2 oz (72.2 kg)  12/24/17 153 lb 12.8 oz (69.8 kg)

## 2018-07-29 NOTE — Progress Notes (Signed)
Radiation Oncology         [phone] ________________________________  Name: Marie Castro MRN: [id number]  Date: 07/29/2018  DOB: 01/18/1971  Follow-Up Visit Note  CC: Andreas Newport, MD    ICD-10-CM   1. Malignant neoplasm of upper-inner quadrant of right breast in female, estrogen receptor positive ([place]) C50.211    Z17.0   2. 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  she is doing well overall.   Since they were last seen in the office, they had a DG chest on 06/13/18 that was: Negative for acute cardiopulmonary disease.       She has an ongoing rash, to which she was evaluated by an Allergist and found that she was allergic to an item commonly placed in foods and hygiene products. She went to the dermatologist for [place] which came back positive. She has an appt with rheumatology to rule out an autoimmune disease. In September, 2019, her Dermatologist informed her that she had radiation dermatitis s/p treatment. Her PCP gave her triamcinolone cream which made her dermatitis go away but other rash persists. She stopped Tamoxifen in August 2019 to ensure that she wasn't having an allergic reaction to Tamoxifen, however, the rash persisted. She has also gained weight due to being placed on a total of 4 rounds of prednisone. she denies breast pain, UE swelling, bleeding, nipple discharge and any other symptoms.                             ALLERGIES:  is allergic  to oxycodone and latex.  Meds: Current Outpatient Medications  Medication Sig Dispense Refill  . cholecalciferol (VITAMIN D) 1000 units tablet Take 1 tablet (1,000 Units total) by mouth daily. 100 tablet 4  . fluocinolone (SYNALAR) 0.025 % ointment     . fluticasone (FLONASE) 50 MCG/ACT nasal spray Place 1 spray into both nostrils 2 (two) times daily.  5  . hydrOXYzine (ATARAX/VISTARIL) 25 MG tablet TAKE 1 TABLET BY MOUTH ONCE DAILY IN THE EVENING AS NEEDED.  5  . levocetirizine (XYZAL) 5 MG tablet Take by mouth.    . levothyroxine (SYNTHROID, LEVOTHROID) 100 MCG tablet Take 100 mcg by mouth daily before breakfast.    . LORazepam (ATIVAN) 0.5 MG tablet Take 0.5 mg by mouth 2 (two) times daily as needed for anxiety.    . ondansetron (ZOFRAN-ODT) 8 MG disintegrating tablet Take 1 tablet (8 mg total) by mouth every 8 (eight) hours as needed for nausea or vomiting. 20 tablet 2  . ranitidine (ZANTAC) 150 MG tablet TAKE 1 TABLET AT BEDTIME (MAY TAKE UP TO TWICE DAILY)  5  . SUMAtriptan (IMITREX) 50 MG tablet Take 1 tablet (50 mg total) by mouth every 2 (two) hours as needed for migraine. May repeat in 2 hours if headache persists or recurs. 10 tablet 2  . diphenhydrAMINE (BENADRYL) 12.5 MG/5ML liquid  Take 37.5 mg by mouth at bedtime as needed for sleep.    Marland Kitchen ibuprofen (ADVIL,MOTRIN) 200 MG tablet Take 400-600 mg by mouth daily as needed for headache or moderate pain.    . Multiple Vitamins-Minerals (MULTIVITAMIN GUMMIES WOMENS) CHEW Chew 2 tablets by mouth daily.    . tamoxifen (NOLVADEX) 20 MG tablet Take 20 mg daily by mouth.    . traMADol (ULTRAM) 50 MG tablet Take every 6 (six) hours as needed by mouth.     No current facility-administered medications for this encounter.     Physical Findings: The patient is in no acute distress. Patient is alert and oriented.  weight is 164 lb 12.8 oz (74.8 kg). Her oral temperature is 98 F (36.7 C). Her blood pressure is 104/71 and her pulse is 70. Her  respiration is 18 and oxygen saturation is 100%. .  No significant changes. Lungs are clear to auscultation bilaterally. Heart has regular rate and rhythm. No palpable cervical, supraclavicular, or axillary adenopathy. Abdomen soft, non-tender, normal bowel sounds. Right breast mild radiation changes noticed. No dominant mass, nipple discharge or bleeding. Left breast has mild radiation changes, but no dominant mass, nipple discharge or bleeding. Pt has some induration near surgical scars. I recommended gentle massage for those issues.  Lab Findings: Lab Results  Component Value Date   WBC 12.0 (H) 05/20/2018   HGB 13.3 05/20/2018   HCT 40.4 05/20/2018   MCV 89.4 05/20/2018   PLT 309 05/20/2018    Radiographic Findings: No results found.  Impression:  The patient is recovering from the effects of radiation.  No evidence of recurrence on clinical exam.   Plan:  PRN f/u in radiation oncology. She will continue close f/u with general surgery and medical oncology.  ____________________________________   Blair Promise, PhD, MD    This document serves as a record of services personally performed by [name], MD. It was created on his behalf by Mary-Margaret Loma Messing, a trained medical scribe. The creation of this record is based on the scribe's personal observations and the provider's statements to them. This document has been checked and approved by the attending provider.

## 2018-08-27 ENCOUNTER — Inpatient Hospital Stay: Payer: BC Managed Care – PPO

## 2018-08-27 ENCOUNTER — Inpatient Hospital Stay: Payer: BC Managed Care – PPO | Attending: Oncology | Admitting: Oncology

## 2018-08-27 VITALS — BP 113/81 | HR 69 | Temp 98.5°F | Resp 20 | Ht 66.0 in | Wt 165.0 lb

## 2018-08-27 DIAGNOSIS — Z170 Estrogen receptor positive status [ER+]: Principal | ICD-10-CM

## 2018-08-27 DIAGNOSIS — C50412 Malignant neoplasm of upper-outer quadrant of left female breast: Secondary | ICD-10-CM

## 2018-08-27 DIAGNOSIS — Z923 Personal history of irradiation: Secondary | ICD-10-CM | POA: Insufficient documentation

## 2018-08-27 DIAGNOSIS — C50211 Malignant neoplasm of upper-inner quadrant of right female breast: Secondary | ICD-10-CM

## 2018-08-27 DIAGNOSIS — Z79899 Other long term (current) drug therapy: Secondary | ICD-10-CM | POA: Diagnosis not present

## 2018-08-27 LAB — COMPREHENSIVE METABOLIC PANEL
ALT: 19 U/L (ref 0–44)
ANION GAP: 9 (ref 5–15)
AST: 21 U/L (ref 15–41)
Albumin: 3.9 g/dL (ref 3.5–5.0)
Alkaline Phosphatase: 52 U/L (ref 38–126)
BUN: 8 mg/dL (ref 6–20)
CALCIUM: 9.3 mg/dL (ref 8.9–10.3)
CHLORIDE: 103 mmol/L (ref 98–111)
CO2: 28 mmol/L (ref 22–32)
Creatinine, Ser: 0.76 mg/dL (ref 0.44–1.00)
GFR calc non Af Amer: >60 mL/min (ref >60)
Glucose, Bld: 94 mg/dL (ref 70–99)
Potassium: 4.2 mmol/L (ref 3.5–5.1)
SODIUM: 140 mmol/L (ref 135–145)
Total Bilirubin: 0.4 mg/dL (ref 0.3–1.2)
Total Protein: 6.9 g/dL (ref 6.5–8.1)

## 2018-08-27 LAB — CBC WITH DIFFERENTIAL/PLATELET
Abs Immature Granulocytes: 0.01 10*3/uL (ref 0.00–0.07)
BASOS ABS: 0.1 10*3/uL (ref 0.0–0.1)
Basophils Relative: 1 %
EOS ABS: 0.3 10*3/uL (ref 0.0–0.5)
Eosinophils Relative: 6 %
HEMATOCRIT: 39.2 % (ref 36.0–46.0)
Hemoglobin: 13.1 g/dL (ref 12.0–15.0)
IMMATURE GRANULOCYTES: 0 %
LYMPHS ABS: 0.9 10*3/uL (ref 0.7–4.0)
LYMPHS PCT: 21 %
MCH: 28.9 pg (ref 26.0–34.0)
MCHC: 33.4 g/dL (ref 30.0–36.0)
MCV: 86.3 fL (ref 80.0–100.0)
Monocytes Absolute: 0.5 10*3/uL (ref 0.1–1.0)
Monocytes Relative: 13 %
NRBC: 0 % (ref 0.0–0.2)
Neutro Abs: 2.5 10*3/uL (ref 1.7–7.7)
Neutrophils Relative %: 59 %
Platelets: 256 10*3/uL (ref 150–400)
RBC: 4.54 MIL/uL (ref 3.87–5.11)
RDW: 12 % (ref 11.5–15.5)
WBC: 4.2 10*3/uL (ref 4.0–10.5)

## 2018-08-27 NOTE — Progress Notes (Signed)
Marie Castro  [phone] [phone]     ID: Marie Castro DOB: Sep 06, 1971  MR#: [id number]  HDQ#:[id number]  Patient Care Team: [name], PA-C as PCP - General (Nurse Practitioner) Aletha Allebach, [name], MD as Consulting Physician (Oncology) [name], MD as Consulting Physician (General Surgery) [name], MD as Consulting Physician (Radiation Oncology) [name], PA-C as Physician Assistant (Obstetrics and Gynecology) Register, Edna, PA-C as Physician Assistant (Dermatology) Tiajuana Amass, MD as Referring Physician (Allergy and Immunology) OTHER MD:  CHIEF COMPLAINT: Invasive lobular breast cancer, estrogen receptor positive  CURRENT TREATMENT: tamoxifen   HISTORY OF CURRENT ILLNESS: From the original intake note:  Marie Castro had bilateral screening mammography at [facility] 07/06/2017, showing a new 1.5 cm oval mass in the right breast upper inner quadrant, a possible development asymmetry in the left breast upper outer quadrant, and a 0.3 cm area of calcifications in the left breast upper outer quadrant. On 07/11/2017 the patient underwent left diagnostic mammography with tomography and bilateral breast ultrasonography. The left breast mammography showed the breast density to be category C. In the upper-outer quadrant of the left breast there was a 0.8 cm oval mass which by ultrasound was a benign complicated cyst. There was also a 0.3 cm area of grouped calcifications in the left breast upper outer quadrant.  In the right breast, ultrasonography showed an irregular mass in the upper inner quadrant adjacent to a benign 1.5 cm simple cyst.  On 07/12/2017 she underwent bilateral biopsies. On the right there was an invasive lobular carcinoma, grade 1 or 2, estrogen receptor 80% positive with moderate staining intensity, progesterone receptor 95% positive with strong staining intensity, with an MIB-1 of 1%, and no HER-2 amplification, the signals  ratio being 1.12 and the number per cell 1.85. ([id number])  On the left the biopsy showed atypical lobular hyperplasia.  Both axillae were sonographically benign  The patient's subsequent history is as detailed below.  INTERVAL HISTORY: Marie Castro returns today for follow-up and treatment of her estrogen receptor positive breast cancer. She discontinued taking tamoxifen due to hives.    REVIEW OF SYSTEMS: Marie Castro reports she underwent allergy testing, with negative results except for fall allergies. She also underwent a patch test, which revealed an allergy to [place]. Because of this, she has cut many things from her diet, including spices (vanilla, paprika, cinnamon,etc), tomatoes, citrus, and sodas. After her doctor increased her Xyzal, she noticed relief after a week. The patient denies unusual headaches, visual changes, nausea, vomiting, stiff neck, dizziness, or gait imbalance. There has been no cough, phlegm production, or pleurisy, no chest pain or pressure, and no change in bowel or bladder habits. The patient denies fever, rash, bleeding, unexplained fatigue or unexplained weight loss. A detailed review of systems was otherwise entirely negative.   PAST MEDICAL HISTORY: Past Medical History:  Diagnosis Date  . Anxiety   . Cancer [facility])    breast  . Family history of breast cancer   . Family history of heart disease   . Family history of prostate cancer   . History of radiation therapy 10/04/17-11/22/17   left breast 50.4 Gy in 28 fractions, left breast boost 10 Gy in 5 fractions, right breast 50.4 Gy in 28 fractions, right breast boost 12 Gy in 6 fractions  . Hypothyroidism   . Thyroid disease     PAST SURGICAL HISTORY: Past Surgical History:  Procedure Laterality Date  . BREAST BIOPSY Right 07/12/2017  . BREAST LUMPECTOMY WITH  RADIOACTIVE SEED AND SENTINEL LYMPH NODE BIOPSY Right 08/16/2017   Procedure: RIGHT BREAST LUMPECTOMY WITH RADIOACTIVE SEED AND RIGHT SENTINEL  LYMPH NODE BIOPSY;  Surgeon: [name], MD;  Location: [facility];  Service: General;  Laterality: Right;  . BREAST LUMPECTOMY WITH RADIOACTIVE SEED LOCALIZATION Left 08/16/2017   Procedure: LEFT BREAST PARTIAL MASTECTOMY WITH BRACKETED RADIOACTIVE SEED LOCALIZATION;  Surgeon: [name], MD;  Location: [facility];  Service: General;  Laterality: Left;  . DILATION AND CURETTAGE OF UTERUS    . KNEE SURGERY    . MULTIPLE TOOTH EXTRACTIONS    . RE-EXCISION OF BREAST LUMPECTOMY Left 08/27/2017   Procedure: LEFT RE-EXCISION OF BREAST LUMPECTOMY ERAS PATHWAY;  Surgeon: [name], MD;  Location: [facility];  Service: General;  Laterality: Left;  . WISDOM TOOTH EXTRACTION      FAMILY HISTORY Family History  Problem Relation Age of Onset  . Cancer Mother        SKIN AND BREAST  . Heart disease Mother   . Heart disease Father   . Hyperlipidemia Father   . Depression Father   . Cancer Maternal Grandmother        BREAST  . Melanoma Maternal Grandmother        toe  . Cancer Maternal Grandfather        PROSTATE  . Irritable bowel syndrome Paternal Grandmother   . Heart disease Paternal Grandfather   . Breast cancer Maternal Aunt 72  . Breast cancer Maternal Aunt        dx in her 46s  The patient's father is 48 year old and her mother 90 year old as of October 2018. The patient has one sister, no brothers. The patient's mother and mother's mother both had breast cancer in their 54s. A maternal aunt had breast cancer at age 9, and another at age 37. The patient's mother has had genetics testing, which was negative. Please also consulted the detailed genetics pedigree in [place]:  No LMP recorded. Menarche age 47, first live birth age 47, she is still premenopausal, with regular periods lasting approximately 5 days, of which 3 are heavy. She used oral contraceptives approximately 2 years with no complications and also had a subcutaneous depot contraceptive briefly.   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 Her husband Hospital doctor ("Chip") is a Control and instrumentation engineer for [employer]. Their son, Trace, is 63 and daughter [name] 63 as of October 2018. The patient attends the [employer] DIRECTIVES:    HEALTH MAINTENANCE: Social History   Tobacco Use  . Smoking status: Never Smoker  . Smokeless tobacco: Never Used  Substance Use Topics  . Alcohol use: No  . Drug use: No     Colonoscopy: Never  PAP:  Bone density: Never   Allergies  Allergen Reactions  . Oxycodone Other (See Comments)    Pt states could not sleep at all, was up 24 hrs  . Latex Rash    Pt states rubber bands from her braces causes ulcers in her mouth    Current Outpatient Medications  Medication Sig Dispense Refill  . cholecalciferol (VITAMIN D) 1000 units tablet Take 1 tablet (1,000 Units total) by mouth daily. 100 tablet 4  . diphenhydrAMINE (BENADRYL) 12.5 MG/5ML liquid Take 37.5 mg by mouth at bedtime as needed for sleep.    . fluocinolone (SYNALAR) 0.025 % ointment     .  fluticasone (FLONASE) 50 MCG/ACT nasal spray Place 1 spray into both nostrils 2 (two) times daily.  5  . hydrOXYzine (ATARAX/VISTARIL) 25 MG tablet TAKE 1 TABLET BY MOUTH ONCE DAILY IN THE EVENING AS NEEDED.  5  . ibuprofen (ADVIL,MOTRIN) 200 MG tablet Take 400-600 mg by mouth daily as needed for headache or moderate pain.    Marland Kitchen levocetirizine (XYZAL) 5 MG tablet Take by mouth.    . levothyroxine (SYNTHROID, LEVOTHROID) 100 MCG tablet Take 100 mcg by mouth daily before breakfast.    . LORazepam (ATIVAN) 0.5 MG tablet Take 0.5 mg by mouth 2 (two) times daily as needed for anxiety.    . Multiple Vitamins-Minerals (MULTIVITAMIN GUMMIES WOMENS) CHEW Chew 2 tablets by mouth daily.    . ondansetron (ZOFRAN-ODT) 8 MG disintegrating tablet Take 1 tablet (8 mg total)  by mouth every 8 (eight) hours as needed for nausea or vomiting. 20 tablet 2  . ranitidine (ZANTAC) 150 MG tablet TAKE 1 TABLET AT BEDTIME (MAY TAKE UP TO TWICE DAILY)  5  . SUMAtriptan (IMITREX) 50 MG tablet Take 1 tablet (50 mg total) by mouth every 2 (two) hours as needed for migraine. May repeat in 2 hours if headache persists or recurs. 10 tablet 2  . tamoxifen (NOLVADEX) 20 MG tablet Take 20 mg daily by mouth.    . traMADol (ULTRAM) 50 MG tablet Take every 6 (six) hours as needed by mouth.     No current facility-administered medications for this visit.     OBJECTIVE: Young white woman in no acute distress  Vitals:   08/27/18 1510  BP: 113/81  Pulse: 69  Resp: 20  Temp: 98.5 F (36.9 C)  SpO2: 98%     Body mass index is 26.63 kg/m.   Wt Readings from Last 3 Encounters:  08/27/18 165 lb (74.8 kg)  07/29/18 164 lb 12.8 oz (74.8 kg)  05/20/18 159 lb 3.2 oz (72.2 kg)      ECOG FS:0 - Asymptomatic  Sclerae unicteric, EOMs intact No cervical or supraclavicular adenopathy Lungs no rales or rhonchi Heart regular rate and rhythm Abd soft, nontender, positive bowel sounds MSK no focal spinal tenderness, no upper extremity lymphedema Neuro: nonfocal, well oriented, appropriate affect Breasts: Status post bilateral lumpectomies and bilateral irradiation.  There is no evidence of local recurrence.  Both axillae are benign.  LAB RESULTS:  CMP     Component Value Date/Time   NA 142 05/20/2018 1421   K 4.9 05/20/2018 1421   CL 105 05/20/2018 1421   CO2 28 05/20/2018 1421   GLUCOSE 93 05/20/2018 1421   BUN 11 05/20/2018 1421   CREATININE 0.83 05/20/2018 1421   CREATININE 0.81 11/13/2017 1512   CALCIUM 9.4 05/20/2018 1421   PROT 7.5 05/20/2018 1421   ALBUMIN 3.6 05/20/2018 1421   AST 20 05/20/2018 1421   AST 16 11/13/2017 1512   ALT 31 05/20/2018 1421   ALT 13 11/13/2017 1512   ALKPHOS 70 05/20/2018 1421   BILITOT 0.4 05/20/2018 1421   BILITOT 0.3 11/13/2017 1512    GFRNONAA >60 05/20/2018 1421   GFRNONAA >60 11/13/2017 1512   GFRAA >60 05/20/2018 1421   GFRAA >60 11/13/2017 1512    No results found for: TOTALPROTELP, ALBUMINELP, A1GS, A2GS, BETS, BETA2SER, GAMS, MSPIKE, SPEI  No results found for: KPAFRELGTCHN, LAMBDASER, KAPLAMBRATIO  Lab Results  Component Value Date   WBC 4.2 08/27/2018   NEUTROABS 2.5 08/27/2018   HGB 13.1 08/27/2018   HCT 39.2 08/27/2018  MCV 86.3 08/27/2018   PLT 256 08/27/2018      Chemistry      Component Value Date/Time   NA 142 05/20/2018 1421   K 4.9 05/20/2018 1421   CL 105 05/20/2018 1421   CO2 28 05/20/2018 1421   BUN 11 05/20/2018 1421   CREATININE 0.83 05/20/2018 1421   CREATININE 0.81 11/13/2017 1512      Component Value Date/Time   CALCIUM 9.4 05/20/2018 1421   ALKPHOS 70 05/20/2018 1421   AST 20 05/20/2018 1421   AST 16 11/13/2017 1512   ALT 31 05/20/2018 1421   ALT 13 11/13/2017 1512   BILITOT 0.4 05/20/2018 1421   BILITOT 0.3 11/13/2017 1512       No results found for: LABCA2  No components found for: [id number]  No results for input(s): INR in the last 168 hours.  No results found for: LABCA2  No results found for: HKV425  No results found for: ZDG387  No results found for: FIE332  No results found for: CA2729  No components found for: HGQUANT  No results found for: CEA1 / No results found for: CEA1   No results found for: AFPTUMOR  No results found for: [place]  No results found for: PSA1  Appointment on 08/27/2018  Component Date Value Ref Range Status  . WBC 08/27/2018 4.2  4.0 - 10.5 K/uL Final  . RBC 08/27/2018 4.54  3.87 - 5.11 MIL/uL Final  . Hemoglobin 08/27/2018 13.1  12.0 - 15.0 g/dL Final  . HCT 08/27/2018 39.2  36.0 - 46.0 % Final  . MCV 08/27/2018 86.3  80.0 - 100.0 fL Final  . MCH 08/27/2018 28.9  26.0 - 34.0 pg Final  . MCHC 08/27/2018 33.4  30.0 - 36.0 g/dL Final  . RDW 08/27/2018 12.0  11.5 - 15.5 % Final  . Platelets 08/27/2018 256  150 -  400 K/uL Final  . nRBC 08/27/2018 0.0  0.0 - 0.2 % Final  . Neutrophils Relative % 08/27/2018 59  % Final  . Neutro Abs 08/27/2018 2.5  1.7 - 7.7 K/uL Final  . Lymphocytes Relative 08/27/2018 21  % Final  . Lymphs Abs 08/27/2018 0.9  0.7 - 4.0 K/uL Final  . Monocytes Relative 08/27/2018 13  % Final  . Monocytes Absolute 08/27/2018 0.5  0.1 - 1.0 K/uL Final  . Eosinophils Relative 08/27/2018 6  % Final  . Eosinophils Absolute 08/27/2018 0.3  0.0 - 0.5 K/uL Final  . Basophils Relative 08/27/2018 1  % Final  . Basophils Absolute 08/27/2018 0.1  0.0 - 0.1 K/uL Final  . Immature Granulocytes 08/27/2018 0  % Final  . Abs Immature Granulocytes 08/27/2018 0.01  0.00 - 0.07 K/uL Final   Performed at [facility], [place] [address] [place], [place] [address]    (this displays the last labs from the last 3 days)  No results found for: TOTALPROTELP, ALBUMINELP, A1GS, A2GS, BETS, BETA2SER, GAMS, MSPIKE, SPEI (this displays SPEP labs)  No results found for: KPAFRELGTCHN, LAMBDASER, KAPLAMBRATIO (kappa/lambda light chains)  No results found for: HGBA, HGBA2QUANT, HGBFQUANT, HGBSQUAN (Hemoglobinopathy evaluation)   No results found for: LDH  No results found for: IRON, TIBC, IRONPCTSAT (Iron and TIBC)  No results found for: FERRITIN  Urinalysis No results found for: COLORURINE, APPEARANCEUR, LABSPEC, PHURINE, GLUCOSEU, HGBUR, BILIRUBINUR, KETONESUR, PROTEINUR, UROBILINOGEN, NITRITE, LEUKOCYTESUR   STUDIES: No results found.   ELIGIBLE FOR AVAILABLE RESEARCH PROTOCOL: no  ASSESSMENT: 47 y.o. [place] woman status post right breast  upper inner quadrant biopsy 07/11/2017 for a clinical T1c N0, stage IA invasive lobular carcinoma, E-cadherin negative, grade 1 or 2, estrogen and progesterone receptor positive, HER-2 nonamplified, with an MIB-1 of 1%  (1) biopsy of left breast upper outer quadrant calcifications 07/11/2017 showed atypical lobular hyperplasia  (a)  breast MRI 07/19/2017 shows 2 additional areas of concern in the left breast, with MRI guided biopsy 07/25/2017 showing ductal carcinoma in situ x2, low-grade, estrogen and progesterone receptor strongly positive.  (2) genetics testing 07/27/2017 through the STAT Breast cancer panel offered by [facility] found no deleterious mutations in ATM, BRCA1, BRCA2, CDH1, CHEK2, PALB2, PTEN, STK11 and TP53.   Reflexed to the Multi-Gene Panel offered by [facility] finding no deleterious mutations in ALK, APC, ATM, AXIN2,BAP1,  BARD1, BLM, BMPR1A, BRCA1, BRCA2, BRIP1, CASR, CDC73, CDH1, CDK4, CDKN1B, CDKN1C, CDKN2A (p14ARF), CDKN2A (p16INK4a), CEBPA, CHEK2, CTNNA1, DICER1, DIS3L2, EGFR (c.2369C>T, p.Thr790Met variant only), EPCAM (Deletion/duplication testing only), FH, FLCN, GATA2, GPC3, GREM1 (Promoter region deletion/duplication testing only), HOXB13 (c.251G>A, p.Gly84Glu), HRAS, KIT, MAX, MEN1, MET, MITF (c.952G>A, p.Glu318Lys variant only), MLH1, MSH2, MSH3, MSH6, MUTYH, NBN, NF1, NF2, NTHL1, PALB2, PDGFRA, PHOX2B, PMS2, POLD1, POLE, POT1, PRKAR1A, PTCH1, PTEN, RAD50, RAD51C, RAD51D, RB1, RECQL4, RET, RUNX1, SDHAF2, SDHA (sequence changes only), SDHB, SDHC, SDHD, SMAD4, SMARCA4, SMARCB1, SMARCE1, STK11, SUFU, TERT, TERT, TMEM127, TP53, TSC1, TSC2, VHL, WRN and WT1.   (3) tamoxifen started neoadjuvantly 07/24/2017, held July 2019 secondary to hives, resumed November 2019  (4) status post right lumpectomy and sentinel lymph node sampling 08/16/2017 for a pT1c pN0, stage IA invasive lobular carcinoma, grade 2, total of 3 sentinel nodes removed  (5) status post left lumpectomy 08/16/2017 for ductal carcinoma in situ, low-grade, with focally positive margins, Tis NX.  (a) additional surgery for margin clearance 08/27/2017 was successful ([id number])  (6) Oncotype DX score of 12 predicts a 10-year risk of recurrence outside the breast of 8% if the patient's only systemic therapy is tamoxifen for 5 years.  It also predicts  no benefit from chemotherapy.  (7) adjuvant radiation 10/04/2018 to 11/22/2017: 50.4 Gy to the left breast with a 10 Gy boost as well as  50.4 Gy to the right breast with a 12 Gy boost  (8) to continue anti-estrogens a minimum of 5 years, then consider 2 years additional aromatase inhibitor treatment.  PLAN: Gracelee was off tamoxifen more than 3 months, with no resolution of the hives.  That certainly argues against tamoxifen being implicated.  She has had significant allergy testing, and has been found to have a [place] allergy.  We looked at the excipients in tamoxifen and none of them seem to lighted to that compound.  At this point, she is ready to resume tamoxifen  Her menstrual periods quickly resumed once tamoxifen was stopped.  She is hoping they will stop again.  She understands tamoxifen is not a contraceptive and they will continue to use barrier methods as before.  She will see her surgeon in December and her primary care physician in June.  She will have mammography again May 11, 2019 and she will see me October 2020  She knows to call for any other issues that may develop before the next visit.    Marquavius Scaife, [name], MD  08/27/18 3:43 PM Medical Oncology and Hematology [facility] [address] [place], [place] [address] Tel. [phone]    Fax. [phone]  I, Wilburn Mylar am acting as scribe for Dr. [name]. Naiyah Klostermann.  I,  Lurline Del MD, have reviewed the above documentation for accuracy and completeness, and I agree with the above.

## 2018-08-28 ENCOUNTER — Telehealth: Payer: Self-pay | Admitting: Oncology

## 2018-08-28 NOTE — Telephone Encounter (Signed)
Called regarding 10/20 °

## 2018-11-12 ENCOUNTER — Other Ambulatory Visit: Payer: Self-pay | Admitting: Oncology

## 2018-12-17 ENCOUNTER — Encounter: Payer: Self-pay | Admitting: Plastic Surgery

## 2018-12-17 ENCOUNTER — Ambulatory Visit: Payer: BC Managed Care – PPO | Admitting: Plastic Surgery

## 2018-12-17 DIAGNOSIS — Z9013 Acquired absence of bilateral breasts and nipples: Secondary | ICD-10-CM | POA: Insufficient documentation

## 2018-12-17 DIAGNOSIS — N651 Disproportion of reconstructed breast: Secondary | ICD-10-CM

## 2018-12-17 NOTE — Progress Notes (Signed)
Patient ID: Marie Castro, female    DOB: 07/12/1971, 48 y.o.   MRN: [id number]   Chief Complaint  Patient presents with  . Breast Problem    The patient is a 48 yrs old wf here for evaluation and consultation for breast reconstruction.  She underwent a screening mammogram in 2018 which showed asymmetry.  She went on for ultrasound and biopsy.  She ended up having right invasive lobular carcinoma that was estrogen positive and progesterone positive and HER-2 negative the left breast biopsy showed atypical lobular hyperplasia.  She was treated with bilateral partial mastectomies and tamoxifen.  She had severe hives that broke out requiring multiple sessions of steroid treatment.  She was then placed on Xolair to keep them under control.  She is still on it now.  She had radiation to both breasts which ended 1 year ago.  She is 5 feet 6 inches tall and weighs 168 pounds.  She is a B cup bra.  She is overall pleased with her results by Dr. Barry Dienes.  She has mild contraction of the left NAC with the left breast slightly smaller than the right. She is interested in seeing if she could achieve better symmetry.   Review of Systems  Constitutional: Negative.  Negative for activity change and appetite change.  HENT: Negative.   Eyes: Negative.   Respiratory: Negative.  Negative for chest tightness and shortness of breath.   Cardiovascular: Negative.  Negative for leg swelling.  Gastrointestinal: Negative.  Negative for abdominal distention and abdominal pain.  Endocrine: Negative.   Genitourinary: Negative.   Musculoskeletal: Negative.  Negative for back pain.  Neurological: Negative.   Hematological: Negative.   Psychiatric/Behavioral: Negative.     Past Medical History:  Diagnosis Date  . Anxiety   . Cancer [facility])    breast  . Family history of breast cancer   . Family history of heart disease   . Family history of prostate cancer   . History of radiation therapy 10/04/17-11/22/17   left  breast 50.4 Gy in 28 fractions, left breast boost 10 Gy in 5 fractions, right breast 50.4 Gy in 28 fractions, right breast boost 12 Gy in 6 fractions  . Hypothyroidism   . Thyroid disease     Past Surgical History:  Procedure Laterality Date  . BREAST BIOPSY Right 07/12/2017  . BREAST LUMPECTOMY WITH RADIOACTIVE SEED AND SENTINEL LYMPH NODE BIOPSY Right 08/16/2017   Procedure: RIGHT BREAST LUMPECTOMY WITH RADIOACTIVE SEED AND RIGHT SENTINEL LYMPH NODE BIOPSY;  Surgeon: [name], MD;  Location: [place];  Service: General;  Laterality: Right;  . BREAST LUMPECTOMY WITH RADIOACTIVE SEED LOCALIZATION Left 08/16/2017   Procedure: LEFT BREAST PARTIAL MASTECTOMY WITH BRACKETED RADIOACTIVE SEED LOCALIZATION;  Surgeon: [name], MD;  Location: [place];  Service: General;  Laterality: Left;  . DILATION AND CURETTAGE OF UTERUS    . KNEE SURGERY    . MULTIPLE TOOTH EXTRACTIONS    . RE-EXCISION OF BREAST LUMPECTOMY Left 08/27/2017   Procedure: LEFT RE-EXCISION OF BREAST LUMPECTOMY ERAS PATHWAY;  Surgeon: [name], MD;  Location: [place];  Service: General;  Laterality: Left;  . WISDOM TOOTH EXTRACTION        Current Outpatient Medications:  .  omalizumab (XOLAIR) 150 MG injection, Inject 150 mg into the skin every 28 (twenty-eight) days., Disp: , Rfl:  .  cholecalciferol (VITAMIN D) 1000 units tablet, Take 1 tablet (1,000 Units total) by mouth daily., Disp: 100 tablet,  Rfl: 4 .  diphenhydrAMINE (BENADRYL) 12.5 MG/5ML liquid, Take 37.5 mg by mouth at bedtime as needed for sleep., Disp: , Rfl:  .  fluocinolone (SYNALAR) 0.025 % ointment, , Disp: , Rfl:  .  fluticasone (FLONASE) 50 MCG/ACT nasal spray, Place 1 spray into both nostrils 2 (two) times daily., Disp: , Rfl: 5 .  hydrOXYzine (ATARAX/VISTARIL) 25 MG tablet, TAKE 1 TABLET BY MOUTH ONCE DAILY IN THE EVENING AS NEEDED., Disp: , Rfl: 5 .  ibuprofen (ADVIL,MOTRIN) 200 MG tablet, Take 400-600 mg by mouth daily as needed for  headache or moderate pain., Disp: , Rfl:  .  levocetirizine (XYZAL) 5 MG tablet, Take by mouth., Disp: , Rfl:  .  levothyroxine (SYNTHROID, LEVOTHROID) 100 MCG tablet, Take 100 mcg by mouth daily before breakfast., Disp: , Rfl:  .  LORazepam (ATIVAN) 0.5 MG tablet, Take 0.5 mg by mouth 2 (two) times daily as needed for anxiety., Disp: , Rfl:  .  Multiple Vitamins-Minerals (MULTIVITAMIN GUMMIES WOMENS) CHEW, Sarina Ser 2 tablets by mouth daily., Disp: , Rfl:  .  ondansetron (ZOFRAN-ODT) 8 MG disintegrating tablet, Take 1 tablet (8 mg total) by mouth every 8 (eight) hours as needed for nausea or vomiting., Disp: 20 tablet, Rfl: 2 .  ranitidine (ZANTAC) 150 MG tablet, TAKE 1 TABLET AT BEDTIME (MAY TAKE UP TO TWICE DAILY), Disp: , Rfl: 5 .  SUMAtriptan (IMITREX) 50 MG tablet, Take 1 tablet (50 mg total) by mouth every 2 (two) hours as needed for migraine. May repeat in 2 hours if headache persists or recurs., Disp: 10 tablet, Rfl: 2 .  tamoxifen (NOLVADEX) 20 MG tablet, TAKE 1 TABLET BY MOUTH EVERY DAY, Disp: 90 tablet, Rfl: 2 .  traMADol (ULTRAM) 50 MG tablet, Take every 6 (six) hours as needed by mouth., Disp: , Rfl:    Objective:   Vitals:   12/17/18 1533  BP: 112/88  Pulse: 80  Temp: 97.8 F (36.6 C)  SpO2: 99%    Physical Exam Vitals signs and nursing note reviewed.  Constitutional:      Appearance: Normal appearance.  HENT:     Head: Normocephalic and atraumatic.     Nose: Nose normal.     Mouth/Throat:     Mouth: Mucous membranes are moist.  Eyes:     Extraocular Movements: Extraocular movements intact.     Pupils: Pupils are equal, round, and reactive to light.  Cardiovascular:     Rate and Rhythm: Normal rate.     Pulses: Normal pulses.  Pulmonary:     Effort: Pulmonary effort is normal.  Chest:    Abdominal:     General: Abdomen is flat. There is no distension.     Tenderness: There is no abdominal tenderness.  Skin:    General: Skin is warm.  Neurological:      General: No focal deficit present.     Mental Status: She is alert.  Psychiatric:        Mood and Affect: Mood normal.        Thought Content: Thought content normal.        Judgment: Judgment normal.     Assessment & Plan:  S/P partial mastectomy, bilateral  Breast asymmetry following reconstructive surgery  We discussed options for improving symmetry.  These include contracture release of the left breast with breaking up of the fat necrosis and scar tissue.  This would likely help to improve the positioning of the NAC.  Fat grafting could then be done to  prevent it from reoccurring.  Other options were discussed but this 1 was the one that she preferred the most.  She would like to think about it and let us know.  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  Pictures placed in epic with patient's permission.  [place], DO

## 2019-03-23 IMAGING — MR MR BREAST BX W LOC DEV EA ADD LESION IMAGE BX SPEC MR GUIDE*L*
6 of 8 series · 33 of 48 positions shown · IV contrast (13ml Multihance)
Comparison: July 19, 2017

ADDENDUM:
Pathology revealed LOW GRADE DUCTAL CARCINOMA IN SITU of the Left
breast, both locations, upper outer mid and upper outer anterior.
This was found to be concordant by Dr. Tirta Diki. Pathology
results were discussed with the patient by telephone. The patient
reported doing well after the biopsies with tenderness at the sites.
Post biopsy instructions and care were reviewed and questions were
answered. The patient was encouraged to call The [REDACTED] of
recent diagnosis of Right breast cancer and Left Breast LOBULAR
NEOPLASIA (ATYPICAL LOBULAR HYPERPLASIA). She was instructed to
follow her outlined treatment plan. Pathology results were sent to
Dr. Dandy Baldonado via [REDACTED] message.

Pathology results reported by Salado Nembang, RN on 07/27/2017.
CLINICAL DATA: The patient had a recent biopsy in the left breast
demonstrating ADH. A subsequent MRI demonstrated abnormal
enhancement both superior and inferior to the biopsy site.
EXAM:
MRI GUIDED CORE NEEDLE BIOPSY OF THE LEFT BREAST
TECHNIQUE: Multiplanar, multisequence MR imaging of the left breast was
performed both before and after administration of intravenous
contrast.
CONTRAST:  MultiHance

[Series 2: fiducial unilateral · sagittal · 2.0mm · 1.33mm/px · 1 of 52 slices shown]
[im 1/52]
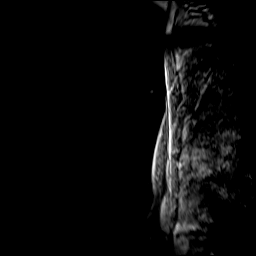

[Series 3: dynamic pre · axial · non-contrast · 1.3mm · 0.73mm/px · z∈[-82,+104]mm · 6 of 144 slices shown]
[im 1/144]
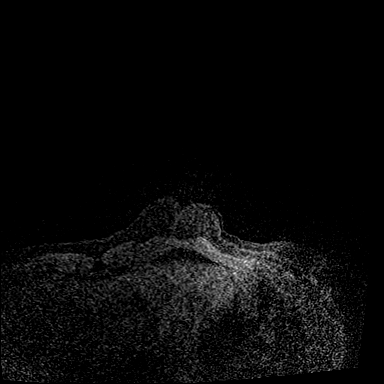
[im 29/144]
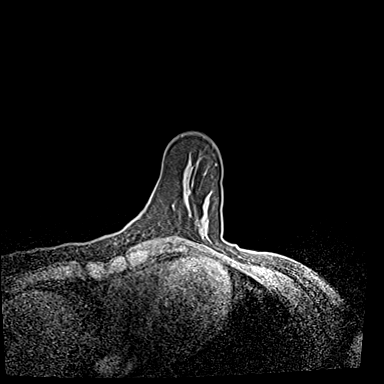
[im 58/144]
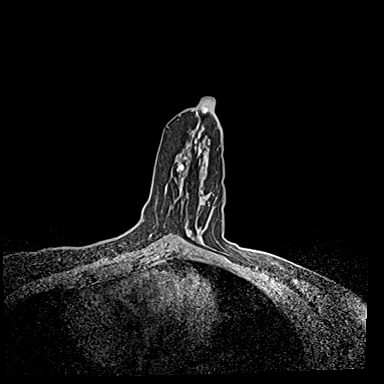
[im 86/144]
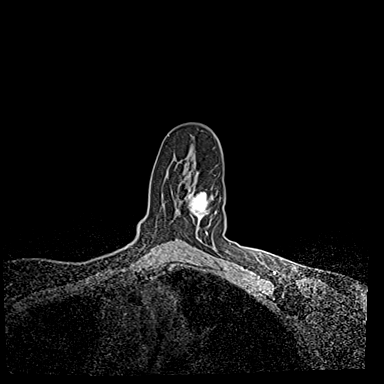
[im 115/144]
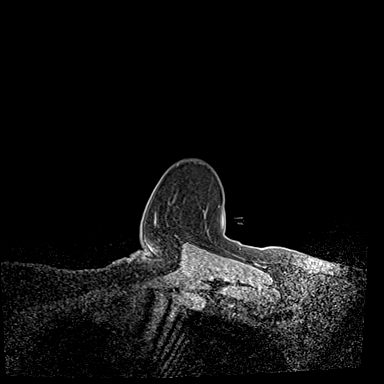
[im 144/144]
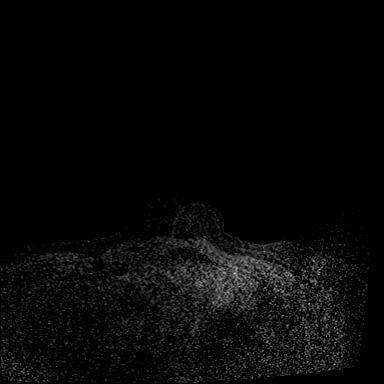

[Series 4: dynamic post 20 · axial · 1.3mm · 0.73mm/px · z∈[-82,+104]mm · 6 of 144 slices shown (1 of 2)]
[im 1/144]
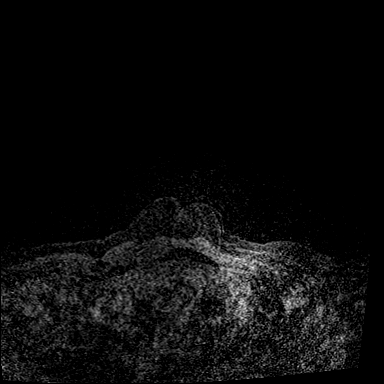
[im 29/144]
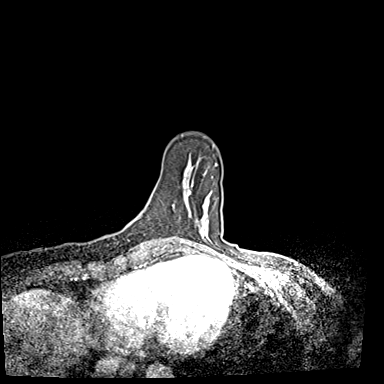
[im 58/144]
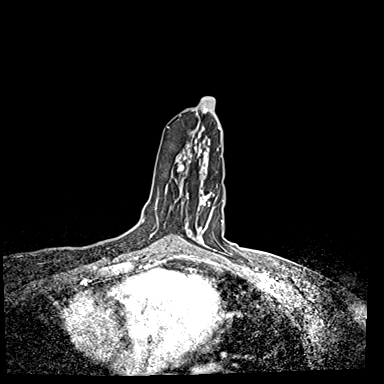
[im 86/144]
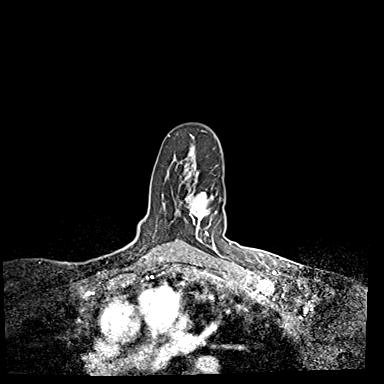
[im 115/144]
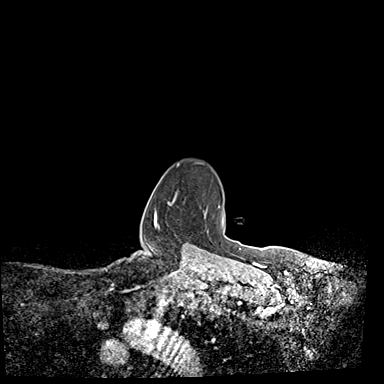
[im 144/144]
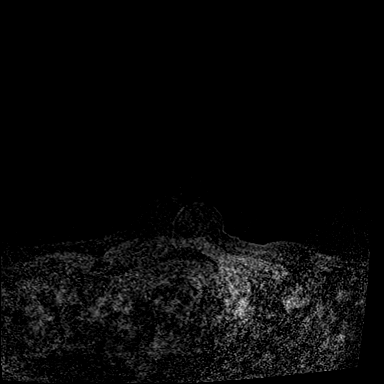

[Series 5: dynamic post 20 · axial · 1.3mm · 0.73mm/px · z∈[-82,+104]mm · 7 of 144 slices shown (2 of 2)]
[im 1/144]
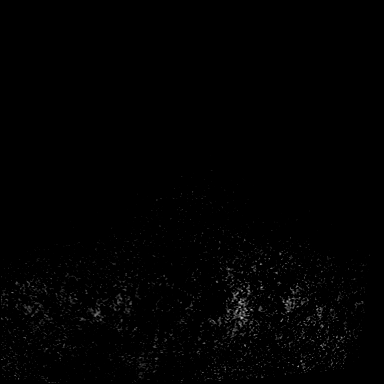
[im 24/144]
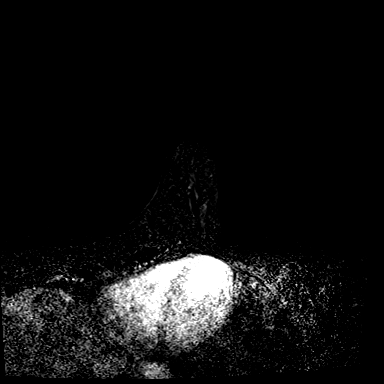
[im 48/144]
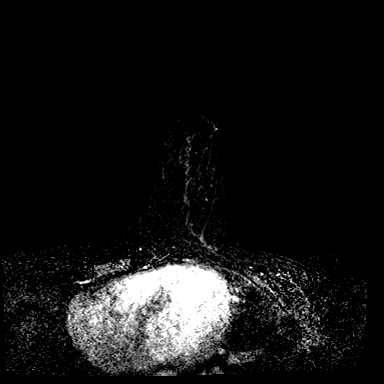
[im 72/144]
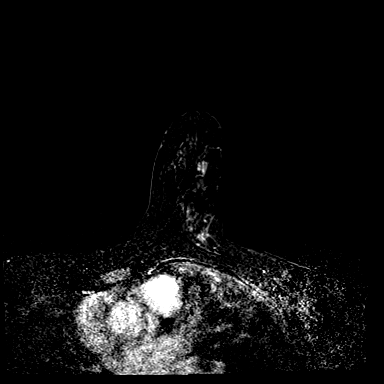
[im 96/144]
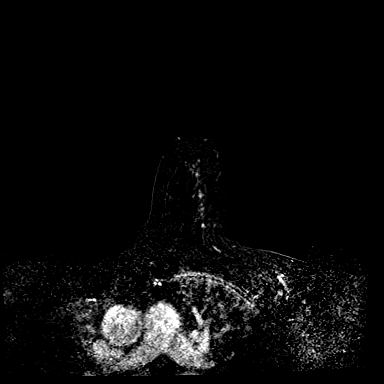
[im 120/144]
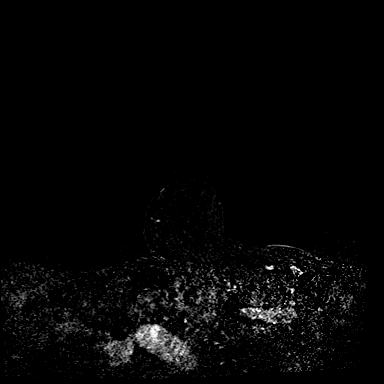
[im 144/144]
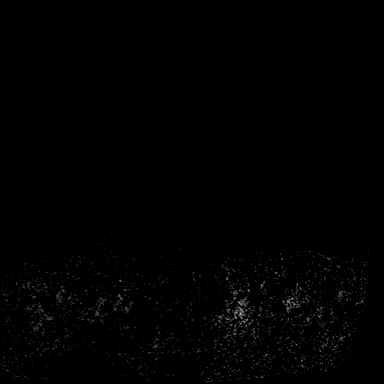

[Series 6: dynamic post 3 · axial · 1.3mm · 0.73mm/px · z∈[-82,+104]mm · 7 of 144 slices shown (1 of 2)]
[im 1/144]
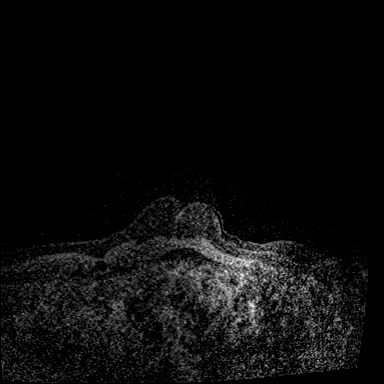
[im 24/144]
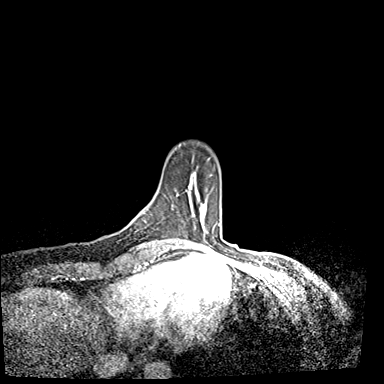
[im 48/144]
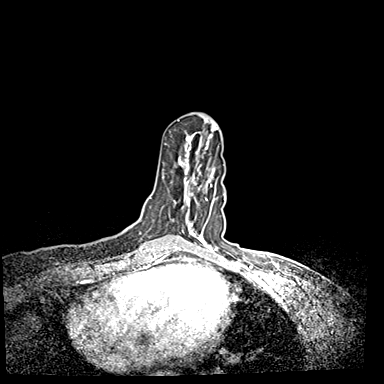
[im 72/144]
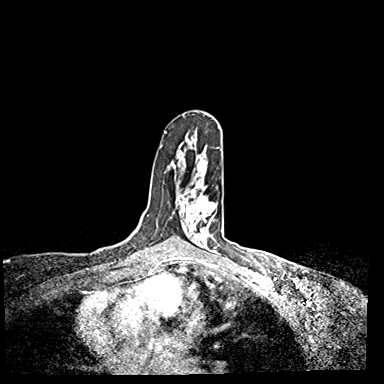
[im 96/144]
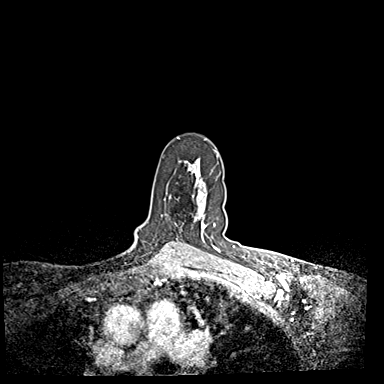
[im 120/144]
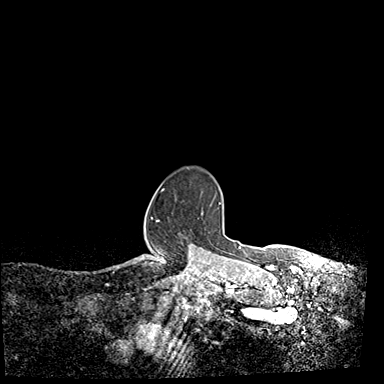
[im 144/144]
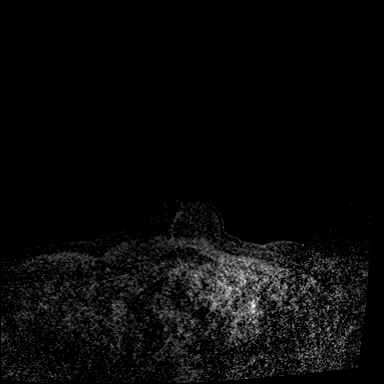

[Series 7: dynamic post 3 · axial · 1.3mm · 0.73mm/px · z∈[-82,+73]mm · 6 of 144 slices shown (2 of 2)]
[im 1/144]
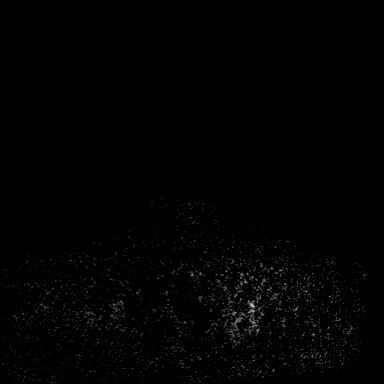
[im 24/144]
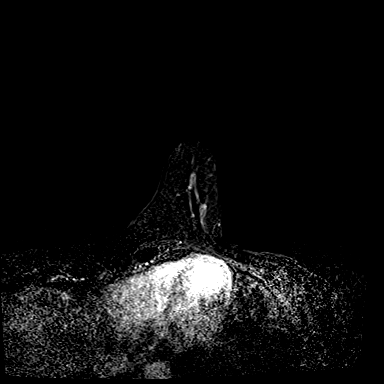
[im 48/144]
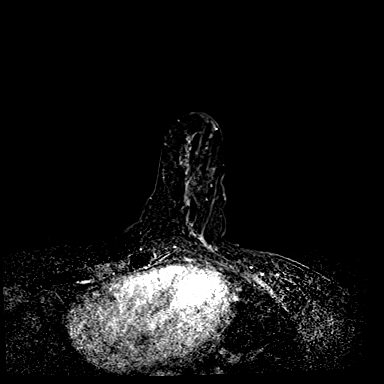
[im 72/144]
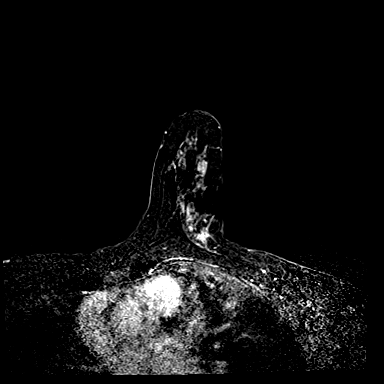
[im 96/144]
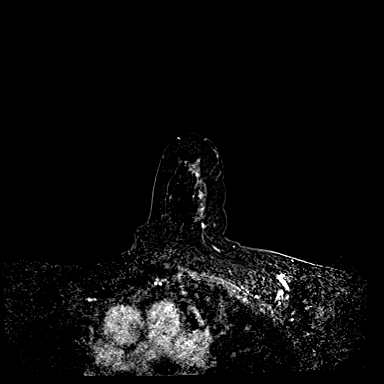
[im 120/144]
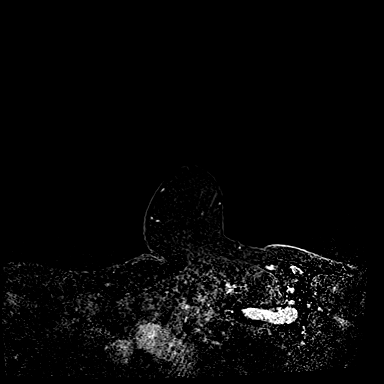

[33 of 48 positions shown; findings below may reference images not displayed]

FINDINGS: I met with the patient, and we discussed the procedure of MRI guided
biopsy, including risks, benefits, and alternatives. Specifically,
we discussed the risks of infection, bleeding, tissue injury, clip
migration, and inadequate sampling. Informed, written consent was
given. The usual time out protocol was performed immediately prior
to the procedure.

Using sterile technique, 1% Lidocaine, MRI guidance, and a 9 gauge
vacuum assisted device, biopsy was performed of the abnormal
enhancement in the upper-outer left breast superior to the recent
biopsy site at a mid depth using a lateral approach. At the
conclusion of the procedure, a dumbbell-shaped tissue marker clip
was deployed into the biopsy cavity. Follow-up 2-view mammogram was
performed and dictated separately.

Using sterile technique, 1% Lidocaine, MRI guidance, and a 9 gauge
vacuum assisted device, biopsy was performed of the abnormal
enhancement along the inferior aspect of the recent biopsy site at
an anterior depth using a lateral approach. At the conclusion of the
procedure, a cylinder shaped tissue marker clip was deployed into
the biopsy cavity. Follow-up 2-view mammogram was performed and
dictated separately.
IMPRESSION: MRI guided biopsy of 2 left breast lesions as above. No apparent
complications.

## 2019-04-08 ENCOUNTER — Other Ambulatory Visit: Payer: Self-pay | Admitting: Medical

## 2019-04-08 DIAGNOSIS — G43009 Migraine without aura, not intractable, without status migrainosus: Secondary | ICD-10-CM

## 2019-04-08 DIAGNOSIS — R112 Nausea with vomiting, unspecified: Secondary | ICD-10-CM

## 2019-04-14 NOTE — Telephone Encounter (Signed)
Dr. Jana Hakim please advise refill.

## 2019-06-10 ENCOUNTER — Other Ambulatory Visit: Payer: Self-pay | Admitting: Oncology

## 2019-07-02 ENCOUNTER — Encounter: Payer: Self-pay | Admitting: Oncology

## 2019-07-07 ENCOUNTER — Other Ambulatory Visit: Payer: Self-pay | Admitting: Oncology

## 2019-07-10 ENCOUNTER — Telehealth: Payer: Self-pay | Admitting: Oncology

## 2019-07-10 NOTE — Telephone Encounter (Signed)
[facility] CME 10/20 moved lab/fu from 10/20 to 12/7. Confirmed with patient. Date per patient due to she wants to see [facility].

## 2019-07-29 ENCOUNTER — Ambulatory Visit: Payer: BC Managed Care – PPO | Admitting: Oncology

## 2019-07-29 ENCOUNTER — Other Ambulatory Visit: Payer: BC Managed Care – PPO

## 2019-07-31 ENCOUNTER — Other Ambulatory Visit: Payer: Self-pay | Admitting: Oncology

## 2019-07-31 DIAGNOSIS — C50211 Malignant neoplasm of upper-inner quadrant of right female breast: Secondary | ICD-10-CM

## 2019-07-31 DIAGNOSIS — C50412 Malignant neoplasm of upper-outer quadrant of left female breast: Secondary | ICD-10-CM

## 2019-07-31 DIAGNOSIS — Z170 Estrogen receptor positive status [ER+]: Secondary | ICD-10-CM

## 2019-09-14 NOTE — Progress Notes (Signed)
[place]  [phone] [phone]     ID: Marie Castro DOB: [birth year]  MR#: [id number]  SEG#:[id number]  Patient Care Team: [name], PA-C as PCP - General (Nurse Practitioner) Urie Loughner, [name], MD as Consulting Physician (Oncology) [name], MD as Consulting Physician (General Surgery) [name], MD as Consulting Physician (Radiation Oncology) [name], PA-C as Physician Assistant (Obstetrics and Gynecology) Register, Luetta Nutting, PA-C as Physician Assistant (Dermatology) Tiajuana Amass, MD as Referring Physician (Allergy and Immunology) OTHER MD:  CHIEF COMPLAINT: Invasive lobular breast cancer, estrogen receptor positive  CURRENT TREATMENT: tamoxifen   INTERVAL HISTORY: Marie Castro returns today for follow-up and treatment of her estrogen receptor positive breast cancer.   She underwent biopsy of one of the hives in an area of skin on her left arm on 09/12/2018. Pathology from the procedure ([id number]) showed changes consistent with urticaria.  She was then started on omalizumab (an anti-IgE Ab) with very good results.  Her tamoxifen was resumed.  She is tolerating it well except that she is having significant problems with hot flashes.  Her gynecologist suggested we could consider Estrovera.  Since her last visit, she underwent bilateral diagnostic mammography with tomography at [facility] on 06/05/2019 showing: breast density category B; no evidence of malignancy in either breast.   REVIEW OF SYSTEMS: Marie Castro has gained some weight and this is distressing to her.  She "aches all over" and also injured her left shoulder in a fall doing some trampoline exercises.  This is keeping her from exercising as much as she would like.  She is on a pretty good diet but admits to some comfort eating.  Aside from these issues a detailed review of systems today was stable   HISTORY OF CURRENT ILLNESS: From the original intake note:  Marie Castro had bilateral  screening mammography at [facility] 07/06/2017, showing a new 1.5 cm oval mass in the right breast upper inner quadrant, a possible development asymmetry in the left breast upper outer quadrant, and a 0.3 cm area of calcifications in the left breast upper outer quadrant. On 07/11/2017 the patient underwent left diagnostic mammography with tomography and bilateral breast ultrasonography. The left breast mammography showed the breast density to be category C. In the upper-outer quadrant of the left breast there was a 0.8 cm oval mass which by ultrasound was a benign complicated cyst. There was also a 0.3 cm area of grouped calcifications in the left breast upper outer quadrant.  In the right breast, ultrasonography showed an irregular mass in the upper inner quadrant adjacent to a benign 1.5 cm simple cyst.  On 07/12/2017 she underwent bilateral biopsies. On the right there was an invasive lobular carcinoma, grade 1 or 2, estrogen receptor 80% positive with moderate staining intensity, progesterone receptor 95% positive with strong staining intensity, with an MIB-1 of 1%, and no HER-2 amplification, the signals ratio being 1.12 and the number per cell 1.85. ([id number])  On the left the biopsy showed atypical lobular hyperplasia.  Both axillae were sonographically benign  The patient's subsequent history is as detailed below.   PAST MEDICAL HISTORY: Past Medical History:  Diagnosis Date  . Anxiety   . Cancer [facility])    breast  . Family history of breast cancer   . Family history of heart disease   . Family history of prostate cancer   . History of radiation therapy 10/04/17-11/22/17   left breast 50.4 Gy in 28 fractions, left breast boost 10 Gy in 5 fractions, right  breast 50.4 Gy in 28 fractions, right breast boost 12 Gy in 6 fractions  . Hypothyroidism   . Thyroid disease     PAST SURGICAL HISTORY: Past Surgical History:  Procedure Laterality Date  . BREAST BIOPSY Right 07/12/2017  . BREAST  LUMPECTOMY WITH RADIOACTIVE SEED AND SENTINEL LYMPH NODE BIOPSY Right 08/16/2017   Procedure: RIGHT BREAST LUMPECTOMY WITH RADIOACTIVE SEED AND RIGHT SENTINEL LYMPH NODE BIOPSY;  Surgeon: [name], MD;  Location: [place];  Service: General;  Laterality: Right;  . BREAST LUMPECTOMY WITH RADIOACTIVE SEED LOCALIZATION Left 08/16/2017   Procedure: LEFT BREAST PARTIAL MASTECTOMY WITH BRACKETED RADIOACTIVE SEED LOCALIZATION;  Surgeon: [name], MD;  Location: [place];  Service: General;  Laterality: Left;  . DILATION AND CURETTAGE OF UTERUS    . KNEE SURGERY    . MULTIPLE TOOTH EXTRACTIONS    . RE-EXCISION OF BREAST LUMPECTOMY Left 08/27/2017   Procedure: LEFT RE-EXCISION OF BREAST LUMPECTOMY ERAS PATHWAY;  Surgeon: [name], MD;  Location: [place];  Service: General;  Laterality: Left;  . WISDOM TOOTH EXTRACTION      FAMILY HISTORY Family History  Problem Relation Age of Onset  . Cancer Mother        SKIN AND BREAST  . Heart disease Mother   . Heart disease Father   . Hyperlipidemia Father   . Depression Father   . Cancer Maternal Grandmother        BREAST  . Melanoma Maternal Grandmother        toe  . Cancer Maternal Grandfather        PROSTATE  . Irritable bowel syndrome Paternal Grandmother   . Heart disease Paternal Grandfather   . Breast cancer Maternal Aunt 72  . Breast cancer Maternal Aunt        dx in her 43s  The patient's father is 56 year old and her mother 28 year old as of October 2018. The patient has one sister, no brothers. The patient's mother and mother's mother both had breast cancer in their 94s. A maternal aunt had breast cancer at age 1, and another at age 44. The patient's mother has had genetics testing, which was negative. Please also consulted the detailed genetics pedigree in [place]:  No LMP recorded. Menarche age 21, first live birth age 74, she is still premenopausal, with regular periods lasting  approximately 5 days, of which 3 are heavy. She used oral contraceptives approximately 2 years with no complications and also had a subcutaneous depot contraceptive briefly.  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 Her husband Hospital doctor ("Chip") is a Control and instrumentation engineer for [employer]. Their son, Trace, is 18 and daughter [name] 13 as of November 2020. The patient attends the [employer] DIRECTIVES: In the absence of any documentation to the contrary, the patient's spouse is their HCPOA.   HEALTH MAINTENANCE: Social History   Tobacco Use  . Smoking status: Never Smoker  . Smokeless tobacco: Never Used  Substance Use Topics  . Alcohol use: No  . Drug use: No     Colonoscopy: Never  PAP:  Bone density: Never   Allergies  Allergen Reactions  . Oxycodone Other (See Comments)    Pt states could not sleep at all, was up 24 hrs  . Latex Rash    Pt states rubber bands from her braces causes ulcers  in her mouth    Current Outpatient Medications  Medication Sig Dispense Refill  . [employer]S D3 25 MCG (1000 UT) capsule TAKE 1 CAPSULE BY MOUTH EVERY DAY 120 capsule 4  . diphenhydrAMINE (BENADRYL) 12.5 MG/5ML liquid Take 37.5 mg by mouth at bedtime as needed for sleep.    Marland Kitchen eletriptan (RELPAX) 40 MG tablet Take 1 tablet (40 mg total) by mouth as needed for migraine or headache. May repeat in 2 hours if headache persists or recurs.    . fluticasone (FLONASE) 50 MCG/ACT nasal spray Place 1 spray into both nostrils 2 (two) times daily.  5  . ibuprofen (ADVIL,MOTRIN) 200 MG tablet Take 400-600 mg by mouth daily as needed for headache or moderate pain.    Marland Kitchen levocetirizine (XYZAL) 5 MG tablet Take by mouth.    . levothyroxine (SYNTHROID, LEVOTHROID) 100 MCG tablet Take 100 mcg by mouth daily before breakfast.    . LORazepam (ATIVAN) 0.5 MG tablet  Take 0.5 mg by mouth 2 (two) times daily as needed for anxiety.    Marland Kitchen omalizumab (XOLAIR) 150 MG injection Inject 150 mg into the skin every 28 (twenty-eight) days.    . ondansetron (ZOFRAN-ODT) 8 MG disintegrating tablet TAKE 1 TABLET (8 MG TOTAL) BY MOUTH EVERY 8 (EIGHT) HOURS AS NEEDED FOR NAUSEA OR VOMITING. 18 tablet 3  . tamoxifen (NOLVADEX) 20 MG tablet TAKE 1 TABLET BY MOUTH EVERY DAY 90 tablet 0  . venlafaxine XR (EFFEXOR-XR) 37.5 MG 24 hr capsule Take 1 capsule (37.5 mg total) by mouth daily with breakfast. 90 capsule 4   No current facility-administered medications for this visit.     OBJECTIVE: Young white woman who appears stated age  89:   09/15/19 1423  BP: 107/72  Pulse: 71  Resp: 18  Temp: 98.2 F (36.8 C)  SpO2: 100%     Body mass index is 28.81 kg/m.   Wt Readings from Last 3 Encounters:  09/15/19 178 lb 8 oz (81 kg)  12/17/18 168 lb (76.2 kg)  08/27/18 165 lb (74.8 kg)      ECOG FS:1 - Symptomatic but completely ambulatory  Sclerae unicteric, EOMs intact Wearing a mask No cervical or supraclavicular adenopathy Lungs no rales or rhonchi Heart regular rate and rhythm Abd soft, nontender, positive bowel sounds MSK no focal spinal tenderness, no upper extremity lymphedema Neuro: nonfocal, well oriented, appropriate affect Breasts: Status post bilateral lumpectomies and bilaterally radiation with no evidence of local recurrence.  Both axillae are benign.   LAB RESULTS:  CMP     Component Value Date/Time   NA 139 09/15/2019 1403   K 4.5 09/15/2019 1403   CL 103 09/15/2019 1403   CO2 28 09/15/2019 1403   GLUCOSE 102 (H) 09/15/2019 1403   BUN 10 09/15/2019 1403   CREATININE 0.85 09/15/2019 1403   CREATININE 0.81 11/13/2017 1512   CALCIUM 9.0 09/15/2019 1403   PROT 7.0 09/15/2019 1403   ALBUMIN 4.0 09/15/2019 1403   AST 20 09/15/2019 1403   AST 16 11/13/2017 1512   ALT 19 09/15/2019 1403   ALT 13 11/13/2017 1512   ALKPHOS 61 09/15/2019 1403    BILITOT 0.3 09/15/2019 1403   BILITOT 0.3 11/13/2017 1512   GFRNONAA >60 09/15/2019 1403   GFRNONAA >60 11/13/2017 1512   GFRAA >60 09/15/2019 1403   GFRAA >60 11/13/2017 1512    No results found for: TOTALPROTELP, ALBUMINELP, A1GS, A2GS, BETS, BETA2SER, GAMS, MSPIKE, SPEI  No results found for: KPAFRELGTCHN, LAMBDASER, KAPLAMBRATIO  Lab Results  Component Value Date   WBC 5.4 09/15/2019   NEUTROABS 3.2 09/15/2019   HGB 12.7 09/15/2019   HCT 38.1 09/15/2019   MCV 86.6 09/15/2019   PLT 290 09/15/2019      Chemistry      Component Value Date/Time   NA 139 09/15/2019 1403   K 4.5 09/15/2019 1403   CL 103 09/15/2019 1403   CO2 28 09/15/2019 1403   BUN 10 09/15/2019 1403   CREATININE 0.85 09/15/2019 1403   CREATININE 0.81 11/13/2017 1512      Component Value Date/Time   CALCIUM 9.0 09/15/2019 1403   ALKPHOS 61 09/15/2019 1403   AST 20 09/15/2019 1403   AST 16 11/13/2017 1512   ALT 19 09/15/2019 1403   ALT 13 11/13/2017 1512   BILITOT 0.3 09/15/2019 1403   BILITOT 0.3 11/13/2017 1512       No results found for: LABCA2  No components found for: AVWPVX480  No results for input(s): INR in the last 168 hours.  No results found for: LABCA2  No results found for: XKP537  No results found for: SMO707  No results found for: EML544  No results found for: CA2729  No components found for: HGQUANT  No results found for: CEA1 / No results found for: CEA1   No results found for: AFPTUMOR  No results found for: CHROMOGRNA  No results found for: PSA1  Appointment on 09/15/2019  Component Date Value Ref Range Status  . Sodium 09/15/2019 139  135 - 145 mmol/L Final  . Potassium 09/15/2019 4.5  3.5 - 5.1 mmol/L Final  . Chloride 09/15/2019 103  98 - 111 mmol/L Final  . CO2 09/15/2019 28  22 - 32 mmol/L Final  . Glucose, Bld 09/15/2019 102* 70 - 99 mg/dL Final  . BUN 09/15/2019 10  6 - 20 mg/dL Final  . Creatinine, Ser 09/15/2019 0.85  0.44 - 1.00 mg/dL Final  .  Calcium 09/15/2019 9.0  8.9 - 10.3 mg/dL Final  . Total Protein 09/15/2019 7.0  6.5 - 8.1 g/dL Final  . Albumin 09/15/2019 4.0  3.5 - 5.0 g/dL Final  . AST 09/15/2019 20  15 - 41 U/L Final  . ALT 09/15/2019 19  0 - 44 U/L Final  . Alkaline Phosphatase 09/15/2019 61  38 - 126 U/L Final  . Total Bilirubin 09/15/2019 0.3  0.3 - 1.2 mg/dL Final  . GFR calc non Af Amer 09/15/2019 >60  >60 mL/min Final  . GFR calc Af Amer 09/15/2019 >60  >60 mL/min Final  . Anion gap 09/15/2019 8  5 - 15 Final   Performed at [facility], [place] [address], [place], [place] [address]  . WBC 09/15/2019 5.4  4.0 - 10.5 K/uL Final  . RBC 09/15/2019 4.40  3.87 - 5.11 MIL/uL Final  . Hemoglobin 09/15/2019 12.7  12.0 - 15.0 g/dL Final  . HCT 09/15/2019 38.1  36.0 - 46.0 % Final  . MCV 09/15/2019 86.6  80.0 - 100.0 fL Final  . MCH 09/15/2019 28.9  26.0 - 34.0 pg Final  . MCHC 09/15/2019 33.3  30.0 - 36.0 g/dL Final  . RDW 09/15/2019 11.9  11.5 - 15.5 % Final  . Platelets 09/15/2019 290  150 - 400 K/uL Final  . nRBC 09/15/2019 0.0  0.0 - 0.2 % Final  . Neutrophils Relative % 09/15/2019 59  % Final  . Neutro Abs 09/15/2019 3.2  1.7 - 7.7 K/uL Final  . Lymphocytes  Relative 09/15/2019 22  % Final  . Lymphs Abs 09/15/2019 1.2  0.7 - 4.0 K/uL Final  . Monocytes Relative 09/15/2019 9  % Final  . Monocytes Absolute 09/15/2019 0.5  0.1 - 1.0 K/uL Final  . Eosinophils Relative 09/15/2019 9  % Final  . Eosinophils Absolute 09/15/2019 0.5  0.0 - 0.5 K/uL Final  . Basophils Relative 09/15/2019 1  % Final  . Basophils Absolute 09/15/2019 0.1  0.0 - 0.1 K/uL Final  . Immature Granulocytes 09/15/2019 0  % Final  . Abs Immature Granulocytes 09/15/2019 0.01  0.00 - 0.07 K/uL Final   Performed at [facility], [place] [address], [place], [place] [address]    (this displays the last labs from the last 3 days)  No results found for: TOTALPROTELP, ALBUMINELP, A1GS, A2GS, BETS, BETA2SER,  GAMS, MSPIKE, SPEI (this displays SPEP labs)  No results found for: KPAFRELGTCHN, LAMBDASER, KAPLAMBRATIO (kappa/lambda light chains)  No results found for: HGBA, HGBA2QUANT, HGBFQUANT, HGBSQUAN (Hemoglobinopathy evaluation)   No results found for: LDH  No results found for: IRON, TIBC, IRONPCTSAT (Iron and TIBC)  No results found for: FERRITIN  Urinalysis No results found for: COLORURINE, APPEARANCEUR, LABSPEC, PHURINE, GLUCOSEU, HGBUR, BILIRUBINUR, KETONESUR, PROTEINUR, UROBILINOGEN, NITRITE, LEUKOCYTESUR   STUDIES: No results found.   ELIGIBLE FOR AVAILABLE RESEARCH PROTOCOL: no  ASSESSMENT: 48 y.o. [place] woman status post right breast upper inner quadrant biopsy 07/11/2017 for a clinical T1c N0, stage IA invasive lobular carcinoma, E-cadherin negative, grade 1 or 2, estrogen and progesterone receptor positive, HER-2 nonamplified, with an MIB-1 of 1%  (1) biopsy of left breast upper outer quadrant calcifications 07/11/2017 showed atypical lobular hyperplasia  (a) breast MRI 07/19/2017 shows 2 additional areas of concern in the left breast, with MRI guided biopsy 07/25/2017 showing ductal carcinoma in situ x2, low-grade, estrogen and progesterone receptor strongly positive.  (2) genetics testing 07/27/2017 through the STAT Breast cancer panel offered by [facility] found no deleterious mutations in ATM, BRCA1, BRCA2, CDH1, CHEK2, PALB2, PTEN, STK11 and TP53.   Reflexed to the Multi-Gene Panel offered by [facility] finding no deleterious mutations in ALK, APC, ATM, AXIN2,BAP1,  BARD1, BLM, BMPR1A, BRCA1, BRCA2, BRIP1, CASR, CDC73, CDH1, CDK4, CDKN1B, CDKN1C, CDKN2A (p14ARF), CDKN2A (p16INK4a), CEBPA, CHEK2, CTNNA1, DICER1, DIS3L2, EGFR (c.2369C>T, p.Thr790Met variant only), EPCAM (Deletion/duplication testing only), FH, FLCN, GATA2, GPC3, GREM1 (Promoter region deletion/duplication testing only), HOXB13 (c.251G>A, p.Gly84Glu), HRAS, KIT, MAX, MEN1, MET, MITF (c.952G>A, p.Glu318Lys  variant only), MLH1, MSH2, MSH3, MSH6, MUTYH, NBN, NF1, NF2, NTHL1, PALB2, PDGFRA, PHOX2B, PMS2, POLD1, POLE, POT1, PRKAR1A, PTCH1, PTEN, RAD50, RAD51C, RAD51D, RB1, RECQL4, RET, RUNX1, SDHAF2, SDHA (sequence changes only), SDHB, SDHC, SDHD, SMAD4, SMARCA4, SMARCB1, SMARCE1, STK11, SUFU, TERT, TERT, TMEM127, TP53, TSC1, TSC2, VHL, WRN and WT1.   (3) tamoxifen started neoadjuvantly 07/24/2017  (a) held July 2019 secondary to hives, resumed November 2019  [(b) Bx of hives = urticaria, treated with omalizumab]  (4) status post right lumpectomy and sentinel lymph node sampling 08/16/2017 for a pT1c pN0, stage IA invasive lobular carcinoma, grade 2, total of 3 sentinel nodes removed  (5) status post left lumpectomy 08/16/2017 for ductal carcinoma in situ, low-grade, with focally positive margins, Tis NX.  (a) additional surgery for margin clearance 08/27/2017 was successful ([id number])  (6) Oncotype DX score of 12 predicts a 10-year risk of recurrence outside the breast of 8% if the patient's only systemic therapy is tamoxifen for 5 years.  It also predicts no benefit from chemotherapy.  (7)  adjuvant radiation 10/04/2018 to 11/22/2017: 50.4 Gy to the left breast with a 10 Gy boost as well as  50.4 Gy to the right breast with a 12 Gy boost  (8) to continue anti-estrogens a minimum of 5 years, then consider 2 years additional aromatase inhibitor treatment.  PLAN: Marie Castro is now 2 years out from definitive surgery for her breast cancer with no evidence of disease recurrence.  This is very favorable.  She is tolerating tamoxifen well except for hot flashes.  The plan will be to continue that medication a minimum of 5 years.  We reviewed the [place] from the [employer] of health.  The main ingredient as well as other ingredients do stimulate the estrogen receptor beta but not alpha.  The studies on this drug of course were obtained on women without breast cancer so we do not have  specific data on women with breast cancer.  Nevertheless I would not be against her giving it a try.  However she is more interested in trying venlafaxine.  This is an antidepressant which has been found to be very effective for hot flashes and works well with tamoxifen.  She is interested in trying the lowest dose, 37.5 mg and I have placed that for her.  I do not have a simple explanation for her aches and pains.  Possibly the fact that she was on steroids for a prolonged period of time and then came off may account for part of it.  Tamoxifen generally does not cause arthralgias.  I suggested she try tai chi type exercise.  We also discussed diet issues and weight gain in menopause.  She will see me again in September of next year and yearly thereafter.  I am going to obtain an [facility] and estradiol level as well as a vitamin D level before that visit  She knows to call for any other problems that may develop before then.     [place] Dembeck, [name], MD  09/15/19 3:11 PM Medical Oncology and Hematology [facility] [place], [place] [address] Tel. [phone]    Fax. [phone]   I, Wilburn Mylar am acting as scribe for Dr. [name]. Aila Terra.  I, Lurline Del MD, have reviewed the above documentation for accuracy and completeness, and I agree with the above.

## 2019-09-15 ENCOUNTER — Other Ambulatory Visit: Payer: Self-pay

## 2019-09-15 ENCOUNTER — Inpatient Hospital Stay: Payer: BC Managed Care – PPO | Admitting: Oncology

## 2019-09-15 ENCOUNTER — Inpatient Hospital Stay: Payer: BC Managed Care – PPO | Attending: Oncology

## 2019-09-15 VITALS — BP 107/72 | HR 71 | Temp 98.2°F | Resp 18 | Ht 66.0 in | Wt 178.5 lb

## 2019-09-15 DIAGNOSIS — R232 Flushing: Secondary | ICD-10-CM | POA: Insufficient documentation

## 2019-09-15 DIAGNOSIS — Z803 Family history of malignant neoplasm of breast: Secondary | ICD-10-CM | POA: Insufficient documentation

## 2019-09-15 DIAGNOSIS — Z79810 Long term (current) use of selective estrogen receptor modulators (SERMs): Secondary | ICD-10-CM | POA: Diagnosis not present

## 2019-09-15 DIAGNOSIS — Z79899 Other long term (current) drug therapy: Secondary | ICD-10-CM | POA: Insufficient documentation

## 2019-09-15 DIAGNOSIS — C50412 Malignant neoplasm of upper-outer quadrant of left female breast: Secondary | ICD-10-CM | POA: Diagnosis not present

## 2019-09-15 DIAGNOSIS — Z8042 Family history of malignant neoplasm of prostate: Secondary | ICD-10-CM | POA: Diagnosis not present

## 2019-09-15 DIAGNOSIS — Z170 Estrogen receptor positive status [ER+]: Secondary | ICD-10-CM | POA: Diagnosis not present

## 2019-09-15 DIAGNOSIS — E039 Hypothyroidism, unspecified: Secondary | ICD-10-CM | POA: Diagnosis not present

## 2019-09-15 DIAGNOSIS — Z923 Personal history of irradiation: Secondary | ICD-10-CM | POA: Diagnosis not present

## 2019-09-15 DIAGNOSIS — F419 Anxiety disorder, unspecified: Secondary | ICD-10-CM | POA: Insufficient documentation

## 2019-09-15 DIAGNOSIS — C50211 Malignant neoplasm of upper-inner quadrant of right female breast: Secondary | ICD-10-CM | POA: Diagnosis not present

## 2019-09-15 LAB — CBC WITH DIFFERENTIAL/PLATELET
Abs Immature Granulocytes: 0.01 10*3/uL (ref 0.00–0.07)
Basophils Absolute: 0.1 10*3/uL (ref 0.0–0.1)
Basophils Relative: 1 %
Eosinophils Absolute: 0.5 10*3/uL (ref 0.0–0.5)
Eosinophils Relative: 9 %
HCT: 38.1 % (ref 36.0–46.0)
Hemoglobin: 12.7 g/dL (ref 12.0–15.0)
Immature Granulocytes: 0 %
Lymphocytes Relative: 22 %
Lymphs Abs: 1.2 10*3/uL (ref 0.7–4.0)
MCH: 28.9 pg (ref 26.0–34.0)
MCHC: 33.3 g/dL (ref 30.0–36.0)
MCV: 86.6 fL (ref 80.0–100.0)
Monocytes Absolute: 0.5 10*3/uL (ref 0.1–1.0)
Monocytes Relative: 9 %
Neutro Abs: 3.2 10*3/uL (ref 1.7–7.7)
Neutrophils Relative %: 59 %
Platelets: 290 10*3/uL (ref 150–400)
RBC: 4.4 MIL/uL (ref 3.87–5.11)
RDW: 11.9 % (ref 11.5–15.5)
WBC: 5.4 10*3/uL (ref 4.0–10.5)
nRBC: 0 % (ref 0.0–0.2)

## 2019-09-15 LAB — COMPREHENSIVE METABOLIC PANEL
ALT: 19 U/L (ref 0–44)
AST: 20 U/L (ref 15–41)
Albumin: 4 g/dL (ref 3.5–5.0)
Alkaline Phosphatase: 61 U/L (ref 38–126)
Anion gap: 8 (ref 5–15)
BUN: 10 mg/dL (ref 6–20)
CO2: 28 mmol/L (ref 22–32)
Calcium: 9 mg/dL (ref 8.9–10.3)
Chloride: 103 mmol/L (ref 98–111)
Creatinine, Ser: 0.85 mg/dL (ref 0.44–1.00)
GFR calc Af Amer: >60 mL/min (ref >60)
GFR calc non Af Amer: >60 mL/min (ref >60)
Glucose, Bld: 102 mg/dL — ABNORMAL HIGH (ref 70–99)
Potassium: 4.5 mmol/L (ref 3.5–5.1)
Sodium: 139 mmol/L (ref 135–145)
Total Bilirubin: 0.3 mg/dL (ref 0.3–1.2)
Total Protein: 7 g/dL (ref 6.5–8.1)

## 2019-09-15 MED ORDER — VENLAFAXINE HCL ER 37.5 MG PO CP24: 37.5000 mg | ORAL_CAPSULE | Freq: Every day | ORAL | 4 refills | Status: DC

## 2019-09-16 ENCOUNTER — Telehealth: Payer: Self-pay | Admitting: Oncology

## 2019-09-16 NOTE — Telephone Encounter (Signed)
I talk with patient regarding schedule  

## 2019-10-23 ENCOUNTER — Other Ambulatory Visit: Payer: Self-pay | Admitting: *Deleted

## 2019-10-23 MED ORDER — TAMOXIFEN CITRATE 20 MG PO TABS: 20.0000 mg | ORAL_TABLET | Freq: Every day | ORAL | 2 refills | Status: DC

## 2019-11-20 ENCOUNTER — Ambulatory Visit: Payer: BC Managed Care – PPO | Attending: Internal Medicine

## 2019-11-20 DIAGNOSIS — Z23 Encounter for immunization: Secondary | ICD-10-CM

## 2019-11-20 NOTE — Progress Notes (Signed)
   Covid-19 Vaccination Clinic  Name:  Marie Castro    MRN: [id number] DOB: 1971-09-11  11/20/2019   [name] was observed post Covid-19 immunization for 15 minutes without incidence. She was provided with Vaccine Information Sheet and instruction to access the V-Safe system.   [name] was instructed to call 911 with any severe reactions post vaccine: Marland Kitchen Difficulty breathing  . Swelling of your face and throat  . A fast heartbeat  . A bad rash all over your body  . Dizziness and weakness    Immunizations Administered    Name Date Dose VIS Date Route   Pfizer COVID-19 Vaccine 11/20/2019  2:05 PM 0.3 mL 09/19/2019 Intramuscular   Manufacturer: [place]   Lot: [id number]   [place]: [id number]

## 2019-12-13 ENCOUNTER — Ambulatory Visit: Payer: BC Managed Care – PPO | Attending: Internal Medicine

## 2019-12-13 DIAGNOSIS — Z23 Encounter for immunization: Secondary | ICD-10-CM | POA: Insufficient documentation

## 2019-12-13 NOTE — Progress Notes (Signed)
   Covid-19 Vaccination Clinic  Name:  Marie Castro    MRN: [id number] DOB: [birth year]  12/13/2019  [name] was observed post Covid-19 immunization for 15 minutes without incident. She was provided with Vaccine Information Sheet and instruction to access the V-Safe system.   [name] was instructed to call 911 with any severe reactions post vaccine: Marland Kitchen Difficulty breathing  . Swelling of face and throat  . A fast heartbeat  . A bad rash all over body  . Dizziness and weakness   Immunizations Administered    Name Date Dose VIS Date Route   Pfizer COVID-19 Vaccine 12/13/2019  1:09 PM 0.3 mL 09/19/2019 Intramuscular   Manufacturer: [place]   Lot: [id number]   [place]: [id number]

## 2020-05-10 ENCOUNTER — Encounter: Payer: Self-pay | Admitting: Oncology

## 2020-06-08 ENCOUNTER — Other Ambulatory Visit: Payer: BC Managed Care – PPO

## 2020-06-08 ENCOUNTER — Other Ambulatory Visit: Payer: Self-pay

## 2020-06-08 ENCOUNTER — Inpatient Hospital Stay: Payer: BC Managed Care – PPO | Attending: Oncology

## 2020-06-08 DIAGNOSIS — C50412 Malignant neoplasm of upper-outer quadrant of left female breast: Secondary | ICD-10-CM

## 2020-06-08 DIAGNOSIS — C50811 Malignant neoplasm of overlapping sites of right female breast: Secondary | ICD-10-CM | POA: Insufficient documentation

## 2020-06-08 DIAGNOSIS — Z170 Estrogen receptor positive status [ER+]: Secondary | ICD-10-CM | POA: Diagnosis not present

## 2020-06-08 DIAGNOSIS — C50211 Malignant neoplasm of upper-inner quadrant of right female breast: Secondary | ICD-10-CM

## 2020-06-08 DIAGNOSIS — Z79899 Other long term (current) drug therapy: Secondary | ICD-10-CM | POA: Diagnosis not present

## 2020-06-08 LAB — COMPREHENSIVE METABOLIC PANEL
ALT: 11 U/L (ref 0–44)
AST: 16 U/L (ref 15–41)
Albumin: 3.8 g/dL (ref 3.5–5.0)
Alkaline Phosphatase: 58 U/L (ref 38–126)
Anion gap: 8 (ref 5–15)
BUN: 9 mg/dL (ref 6–20)
CO2: 27 mmol/L (ref 22–32)
Calcium: 9.6 mg/dL (ref 8.9–10.3)
Chloride: 102 mmol/L (ref 98–111)
Creatinine, Ser: 0.8 mg/dL (ref 0.44–1.00)
GFR calc Af Amer: >60 mL/min (ref >60)
GFR calc non Af Amer: >60 mL/min (ref >60)
Glucose, Bld: 107 mg/dL — ABNORMAL HIGH (ref 70–99)
Potassium: 4.2 mmol/L (ref 3.5–5.1)
Sodium: 137 mmol/L (ref 135–145)
Total Bilirubin: 0.3 mg/dL (ref 0.3–1.2)
Total Protein: 6.9 g/dL (ref 6.5–8.1)

## 2020-06-08 LAB — CBC WITH DIFFERENTIAL/PLATELET
Abs Immature Granulocytes: 0.02 10*3/uL (ref 0.00–0.07)
Basophils Absolute: 0.1 10*3/uL (ref 0.0–0.1)
Basophils Relative: 2 %
Eosinophils Absolute: 0.7 10*3/uL — ABNORMAL HIGH (ref 0.0–0.5)
Eosinophils Relative: 11 %
HCT: 37.3 % (ref 36.0–46.0)
Hemoglobin: 12.5 g/dL (ref 12.0–15.0)
Immature Granulocytes: 0 %
Lymphocytes Relative: 25 %
Lymphs Abs: 1.5 10*3/uL (ref 0.7–4.0)
MCH: 29.3 pg (ref 26.0–34.0)
MCHC: 33.5 g/dL (ref 30.0–36.0)
MCV: 87.6 fL (ref 80.0–100.0)
Monocytes Absolute: 0.6 10*3/uL (ref 0.1–1.0)
Monocytes Relative: 11 %
Neutro Abs: 3 10*3/uL (ref 1.7–7.7)
Neutrophils Relative %: 51 %
Platelets: 296 10*3/uL (ref 150–400)
RBC: 4.26 MIL/uL (ref 3.87–5.11)
RDW: 12.2 % (ref 11.5–15.5)
WBC: 5.9 10*3/uL (ref 4.0–10.5)
nRBC: 0 % (ref 0.0–0.2)

## 2020-06-08 LAB — VITAMIN D 25 HYDROXY (VIT D DEFICIENCY, FRACTURES): Vit D, 25-Hydroxy: 43.47 ng/mL (ref 30–100)

## 2020-06-09 LAB — FOLLICLE STIMULATING HORMONE: FSH: 44 m[IU]/mL

## 2020-06-11 ENCOUNTER — Other Ambulatory Visit: Payer: Self-pay | Admitting: Oncology

## 2020-06-11 LAB — ESTRADIOL, ULTRA SENS: Estradiol, Sensitive: 60.3 pg/mL

## 2020-06-21 NOTE — Progress Notes (Signed)
[place]  [phone] [phone]     ID: Marie Castro DOB: [birth year]  MR#: [id number]  POE#:[id number]  Patient Care Team: [name], [place] as PCP - General (Nurse Practitioner) Urania Pearlman, [name], MD as Consulting Physician (Oncology) [name], MD as Consulting Physician (General Surgery) [name], MD as Consulting Physician (Radiation Oncology) [name], PA-C as Physician Assistant (Obstetrics and Gynecology) Register, Luetta Nutting, PA-C as Physician Assistant (Dermatology) Tiajuana Amass, MD as Referring Physician (Allergy and Immunology) OTHER MD:  CHIEF COMPLAINT: Invasive lobular breast cancer, estrogen receptor positive  CURRENT TREATMENT: tamoxifen   INTERVAL HISTORY: Marie Castro returns today for follow-up of her estrogen receptor positive breast cancer.   She continues on tamoxifen.  She tolerates this generally well, although her hot flashes have been a little bit more pronounced lately.  They decreased when we started her on venlafaxine initially at 37.5.  She continues on that but with less effectiveness.  Since her last visit, she underwent bilateral diagnostic mammography with tomography at [facility] on 06/08/2020 showing: breast density category B; no evidence of malignancy in either breast.    REVIEW OF SYSTEMS: Marie Castro has had some joint issues which have led her to her rheumatology visit which is still pending.  Her biggest concern is her weight.  She exercises quite a bit over the summer when she had more time to herself but did not lose weight.  She did feel more fit.  She is now planning to participate either in the [place] or [employer] program.  Aside from this she has a lot of things going on with her son going to soccer practice she is working full-time having to 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  A detailed review of systems today was otherwise stable  HISTORY OF CURRENT ILLNESS: From the original intake  note:  Marie Castro had bilateral screening mammography at [facility] 07/06/2017, showing a new 1.5 cm oval mass in the right breast upper inner quadrant, a possible development asymmetry in the left breast upper outer quadrant, and a 0.3 cm area of calcifications in the left breast upper outer quadrant. On 07/11/2017 the patient underwent left diagnostic mammography with tomography and bilateral breast ultrasonography. The left breast mammography showed the breast density to be category C. In the upper-outer quadrant of the left breast there was a 0.8 cm oval mass which by ultrasound was a benign complicated cyst. There was also a 0.3 cm area of grouped calcifications in the left breast upper outer quadrant.  In the right breast, ultrasonography showed an irregular mass in the upper inner quadrant adjacent to a benign 1.5 cm simple cyst.  On 07/12/2017 she underwent bilateral biopsies. On the right there was an invasive lobular carcinoma, grade 1 or 2, estrogen receptor 80% positive with moderate staining intensity, progesterone receptor 95% positive with strong staining intensity, with an MIB-1 of 1%, and no HER-2 amplification, the signals ratio being 1.12 and the number per cell 1.85. ([id number])  On the left the biopsy showed atypical lobular hyperplasia.  Both axillae were sonographically benign  The patient's subsequent history is as detailed below.   PAST MEDICAL HISTORY: Past Medical History:  Diagnosis Date  . Anxiety   . Cancer [facility])    breast  . Family history of breast cancer   . Family history of heart disease   . Family history of prostate cancer   . History of radiation therapy 10/04/17-11/22/17   left breast 50.4 Gy  in 28 fractions, left breast boost 10 Gy in 5 fractions, right breast 50.4 Gy in 28 fractions, right breast boost 12 Gy in 6 fractions  . Hypothyroidism   . Thyroid disease     PAST SURGICAL HISTORY: Past Surgical History:  Procedure Laterality Date  . BREAST  BIOPSY Right 07/12/2017  . BREAST LUMPECTOMY WITH RADIOACTIVE SEED AND SENTINEL LYMPH NODE BIOPSY Right 08/16/2017   Procedure: RIGHT BREAST LUMPECTOMY WITH RADIOACTIVE SEED AND RIGHT SENTINEL LYMPH NODE BIOPSY;  Surgeon: [name], MD;  Location: [place];  Service: General;  Laterality: Right;  . BREAST LUMPECTOMY WITH RADIOACTIVE SEED LOCALIZATION Left 08/16/2017   Procedure: LEFT BREAST PARTIAL MASTECTOMY WITH BRACKETED RADIOACTIVE SEED LOCALIZATION;  Surgeon: [name], MD;  Location: [place];  Service: General;  Laterality: Left;  . DILATION AND CURETTAGE OF UTERUS    . KNEE SURGERY    . MULTIPLE TOOTH EXTRACTIONS    . RE-EXCISION OF BREAST LUMPECTOMY Left 08/27/2017   Procedure: LEFT RE-EXCISION OF BREAST LUMPECTOMY ERAS PATHWAY;  Surgeon: [name], MD;  Location: [place];  Service: General;  Laterality: Left;  . WISDOM TOOTH EXTRACTION      FAMILY HISTORY Family History  Problem Relation Age of Onset  . Cancer Mother        SKIN AND BREAST  . Heart disease Mother   . Heart disease Father   . Hyperlipidemia Father   . Depression Father   . Cancer Maternal Grandmother        BREAST  . Melanoma Maternal Grandmother        toe  . Cancer Maternal Grandfather        PROSTATE  . Irritable bowel syndrome Paternal Grandmother   . Heart disease Paternal Grandfather   . Breast cancer Maternal Aunt 72  . Breast cancer Maternal Aunt        dx in her 40s  The patient's father is 42 year old and her mother 32 year old as of October 2018. The patient has one sister, no brothers. The patient's mother and mother's mother both had breast cancer in their 54s. A maternal aunt had breast cancer at age 54, and another at age 31. The patient's mother has had genetics testing, which was negative. Please also consulted the detailed genetics pedigree in [place]:  No LMP recorded. Menarche age 65, first live birth age 11, she is still premenopausal, with  regular periods lasting approximately 5 days, of which 3 are heavy. She used oral contraceptives approximately 2 years with no complications and also had a subcutaneous depot contraceptive briefly.  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 Her husband Hospital doctor ("Chip") is a Control and instrumentation engineer for [employer]. Their son, Trace, is 87 and daughter [name] 46 as of November 2020. The patient attends the [employer] DIRECTIVES: In the absence of any documentation to the contrary, the patient's spouse is their HCPOA.   HEALTH MAINTENANCE: Social History   Tobacco Use  . Smoking status: Never Smoker  . Smokeless tobacco: Never Used  Vaping Use  . Vaping Use: Never used  Substance Use Topics  . Alcohol use: No  . Drug use: No     Colonoscopy: Never  PAP:  Bone density: Never   Allergies  Allergen Reactions  . Oxycodone Other (See Comments)    Pt states could not sleep at  all, was up 24 hrs  . Latex Rash    Pt states rubber bands from her braces causes ulcers in her mouth    Current Outpatient Medications  Medication Sig Dispense Refill  . [employer]S D3 25 MCG (1000 UT) capsule TAKE 1 CAPSULE BY MOUTH EVERY DAY 120 capsule 4  . diphenhydrAMINE (BENADRYL) 12.5 MG/5ML liquid Take 37.5 mg by mouth at bedtime as needed for sleep.    Marland Kitchen eletriptan (RELPAX) 40 MG tablet Take 1 tablet (40 mg total) by mouth as needed for migraine or headache. May repeat in 2 hours if headache persists or recurs.    . fluticasone (FLONASE) 50 MCG/ACT nasal spray Place 1 spray into both nostrils 2 (two) times daily.  5  . ibuprofen (ADVIL,MOTRIN) 200 MG tablet Take 400-600 mg by mouth daily as needed for headache or moderate pain.    Marland Kitchen levocetirizine (XYZAL) 5 MG tablet Take by mouth.    . levothyroxine (SYNTHROID, LEVOTHROID) 100 MCG tablet Take 100 mcg by mouth  daily before breakfast.    . LORazepam (ATIVAN) 0.5 MG tablet Take 0.5 mg by mouth 2 (two) times daily as needed for anxiety.    Marland Kitchen omalizumab (XOLAIR) 150 MG injection Inject 150 mg into the skin every 28 (twenty-eight) days.    . ondansetron (ZOFRAN-ODT) 8 MG disintegrating tablet TAKE 1 TABLET (8 MG TOTAL) BY MOUTH EVERY 8 (EIGHT) HOURS AS NEEDED FOR NAUSEA OR VOMITING. 18 tablet 3  . tamoxifen (NOLVADEX) 20 MG tablet Take 1 tablet (20 mg total) by mouth daily. 90 tablet 2  . venlafaxine XR (EFFEXOR-XR) 75 MG 24 hr capsule Take 1 capsule (75 mg total) by mouth daily with breakfast. 60 capsule 6   No current facility-administered medications for this visit.    OBJECTIVE: White woman in no acute distress  Vitals:   06/22/20 1529  BP: 120/64  Pulse: 73  Resp: 18  Temp: (!) 97.4 F (36.3 C)  SpO2: 99%     Body mass index is 28.91 kg/m.   Wt Readings from Last 3 Encounters:  06/22/20 179 lb 1.6 oz (81.2 kg)  09/15/19 178 lb 8 oz (81 kg)  12/17/18 168 lb (76.2 kg)      ECOG FS:1 - Symptomatic but completely ambulatory  Sclerae unicteric, EOMs intact Wearing a mask No cervical or supraclavicular adenopathy Lungs no rales or rhonchi Heart regular rate and rhythm Abd soft, nontender, positive bowel sounds MSK no focal spinal tenderness, no upper extremity lymphedema Neuro: nonfocal, well oriented, appropriate affect Breasts: Status post bilateral lumpectomies, bilateral radiation, no evidence of local recurrence.  Both axillae are benign.   LAB RESULTS:  CMP     Component Value Date/Time   NA 137 06/08/2020 1607   K 4.2 06/08/2020 1607   CL 102 06/08/2020 1607   CO2 27 06/08/2020 1607   GLUCOSE 107 (H) 06/08/2020 1607   BUN 9 06/08/2020 1607   CREATININE 0.80 06/08/2020 1607   CREATININE 0.81 11/13/2017 1512   CALCIUM 9.6 06/08/2020 1607   PROT 6.9 06/08/2020 1607   ALBUMIN 3.8 06/08/2020 1607   AST 16 06/08/2020 1607   AST 16 11/13/2017 1512   ALT 11 06/08/2020  1607   ALT 13 11/13/2017 1512   ALKPHOS 58 06/08/2020 1607   BILITOT 0.3 06/08/2020 1607   BILITOT 0.3 11/13/2017 1512   GFRNONAA >60 06/08/2020 1607   GFRNONAA >60 11/13/2017 1512   GFRAA >60 06/08/2020 1607   GFRAA >60 11/13/2017 1512  No results found for: TOTALPROTELP, ALBUMINELP, A1GS, A2GS, BETS, BETA2SER, GAMS, MSPIKE, SPEI  No results found for: Nils Pyle, [facility]  Lab Results  Component Value Date   WBC 5.9 06/08/2020   NEUTROABS 3.0 06/08/2020   HGB 12.5 06/08/2020   HCT 37.3 06/08/2020   MCV 87.6 06/08/2020   PLT 296 06/08/2020      Chemistry      Component Value Date/Time   NA 137 06/08/2020 1607   K 4.2 06/08/2020 1607   CL 102 06/08/2020 1607   CO2 27 06/08/2020 1607   BUN 9 06/08/2020 1607   CREATININE 0.80 06/08/2020 1607   CREATININE 0.81 11/13/2017 1512      Component Value Date/Time   CALCIUM 9.6 06/08/2020 1607   ALKPHOS 58 06/08/2020 1607   AST 16 06/08/2020 1607   AST 16 11/13/2017 1512   ALT 11 06/08/2020 1607   ALT 13 11/13/2017 1512   BILITOT 0.3 06/08/2020 1607   BILITOT 0.3 11/13/2017 1512       No results found for: LABCA2  No components found for: XNATFT732  No results for input(s): INR in the last 168 hours.  No results found for: LABCA2  No results found for: KGU542  No results found for: HCW237  No results found for: SEG315  No results found for: CA2729  No components found for: HGQUANT  No results found for: CEA1 / No results found for: CEA1   No results found for: AFPTUMOR  No results found for: CHROMOGRNA  No results found for: HGBA, HGBA2QUANT, HGBFQUANT, HGBSQUAN (Hemoglobinopathy evaluation)   No results found for: LDH  No results found for: IRON, TIBC, IRONPCTSAT (Iron and TIBC)  No results found for: FERRITIN  Urinalysis No results found for: COLORURINE, APPEARANCEUR, LABSPEC, PHURINE, GLUCOSEU, HGBUR, BILIRUBINUR, KETONESUR, PROTEINUR, UROBILINOGEN, NITRITE,  LEUKOCYTESUR   STUDIES: No results found.   ELIGIBLE FOR AVAILABLE RESEARCH PROTOCOL: no  ASSESSMENT: 49 y.o. Marie Castro woman status post right breast upper inner quadrant biopsy 07/11/2017 for a clinical T1c N0, stage IA invasive lobular carcinoma, E-cadherin negative, grade 1 or 2, estrogen and progesterone receptor positive, HER-2 nonamplified, with an MIB-1 of 1%  (1) biopsy of left breast upper outer quadrant calcifications 07/11/2017 showed atypical lobular hyperplasia  (a) breast MRI 07/19/2017 shows 2 additional areas of concern in the left breast, with MRI guided biopsy 07/25/2017 showing ductal carcinoma in situ x2, low-grade, estrogen and progesterone receptor strongly positive.  (2) genetics testing 07/27/2017 through the STAT Breast cancer panel offered by [facility] found no deleterious mutations in ATM, BRCA1, BRCA2, CDH1, CHEK2, PALB2, PTEN, STK11 and TP53.   Reflexed to the Multi-Gene Panel offered by [facility] finding no deleterious mutations in ALK, APC, ATM, AXIN2,BAP1,  BARD1, BLM, BMPR1A, BRCA1, BRCA2, BRIP1, CASR, CDC73, CDH1, CDK4, CDKN1B, CDKN1C, CDKN2A (p14ARF), CDKN2A (p16INK4a), CEBPA, CHEK2, CTNNA1, DICER1, DIS3L2, EGFR (c.2369C>T, p.Thr790Met variant only), EPCAM (Deletion/duplication testing only), FH, FLCN, GATA2, GPC3, GREM1 (Promoter region deletion/duplication testing only), HOXB13 (c.251G>A, p.Gly84Glu), HRAS, KIT, MAX, MEN1, MET, MITF (c.952G>A, p.Glu318Lys variant only), MLH1, MSH2, MSH3, MSH6, MUTYH, NBN, NF1, NF2, NTHL1, PALB2, PDGFRA, PHOX2B, PMS2, POLD1, POLE, POT1, PRKAR1A, PTCH1, PTEN, RAD50, RAD51C, RAD51D, RB1, RECQL4, RET, RUNX1, SDHAF2, SDHA (sequence changes only), SDHB, SDHC, SDHD, SMAD4, SMARCA4, SMARCB1, SMARCE1, STK11, SUFU, TERT, TERT, TMEM127, TP53, TSC1, TSC2, VHL, WRN and WT1.   (3) tamoxifen started neoadjuvantly 07/24/2017  (a) held July 2019 secondary to hives, resumed November 2019  [(b) Bx of hives = urticaria, treated with  omalizumab]  (  4) status post right lumpectomy and sentinel lymph node sampling 08/16/2017 for a pT1c pN0, stage IA invasive lobular carcinoma, grade 2, total of 3 sentinel nodes removed  (5) status post left lumpectomy 08/16/2017 for ductal carcinoma in situ, low-grade, with focally positive margins, Tis NX.  (a) additional surgery for margin clearance 08/27/2017 was successful ([id number])  (6) Oncotype DX score of 12 predicts a 10-year risk of recurrence outside the breast of 8% if the patient's only systemic therapy is tamoxifen for 5 years.  It also predicts no benefit from chemotherapy.  (7) adjuvant radiation 10/04/2018 to 11/22/2017: 50.4 Gy to the left breast with a 10 Gy boost as well as  50.4 Gy to the right breast with a 12 Gy boost  (8) to continue tamoxifen a minimum of 5 years, then consider 2 years additional aromatase inhibitor treatment  (a) on 06/08/2020 FSH was 44.4 with estradiol 60.3 (perimenopausal).  PLAN: Mackinzie will soon be 3 years out from definitive surgery for breast cancer with no evidence of disease recurrence.  This is very favorable.  She is tolerating tamoxifen generally well aside from hot flashes.  We are going to increase her venlafaxine to 75 mg to see if that makes a difference.  We discussed diet and exercise extensively today.  I do not think she is far from her goal.  Her BMI is in the normal range.  She would like to lose some weight and she will have to do it through diet.  It does not help but she is very busy with a full-time job, full-time wife role, and full-time mother role.  She is planning to participate in a weight loss program and that probably will provide the structure she needs  Otherwise we discussed her [place] and estradiol which show she is perimenopausal but not yet fully menopausal.  She will see me again in 1 year.  She knows to call for any other issues that may develop before the next visit  Total encounter time 25  minutes.  Yordan Martindale, [name], MD  06/22/20 5:16 PM Medical Oncology and Hematology [facility] [place], [place] [address] Tel. [phone]    Fax. [phone]   I, Wilburn Mylar am acting as scribe for Dr. [name]. Tehillah Cipriani.  I, Lurline Del MD, have reviewed the above documentation for accuracy and completeness, and I agree with the above.   *Total Encounter Time as defined by the Centers for Medicare and Medicaid Services includes, in addition to the face-to-face time of a patient visit (documented in the note above) non-face-to-face time: obtaining and reviewing outside history, ordering and reviewing medications, tests or procedures, care coordination (communications with other health care professionals or caregivers) and documentation in the medical record.

## 2020-06-22 ENCOUNTER — Inpatient Hospital Stay: Payer: BC Managed Care – PPO | Attending: Oncology | Admitting: Oncology

## 2020-06-22 ENCOUNTER — Other Ambulatory Visit: Payer: Self-pay

## 2020-06-22 VITALS — BP 120/64 | HR 73 | Temp 97.4°F | Resp 18 | Ht 66.0 in | Wt 179.1 lb

## 2020-06-22 DIAGNOSIS — Z791 Long term (current) use of non-steroidal anti-inflammatories (NSAID): Secondary | ICD-10-CM | POA: Diagnosis not present

## 2020-06-22 DIAGNOSIS — Z79899 Other long term (current) drug therapy: Secondary | ICD-10-CM | POA: Insufficient documentation

## 2020-06-22 DIAGNOSIS — F419 Anxiety disorder, unspecified: Secondary | ICD-10-CM | POA: Diagnosis not present

## 2020-06-22 DIAGNOSIS — Z8042 Family history of malignant neoplasm of prostate: Secondary | ICD-10-CM | POA: Diagnosis not present

## 2020-06-22 DIAGNOSIS — Z8349 Family history of other endocrine, nutritional and metabolic diseases: Secondary | ICD-10-CM | POA: Insufficient documentation

## 2020-06-22 DIAGNOSIS — C50412 Malignant neoplasm of upper-outer quadrant of left female breast: Secondary | ICD-10-CM

## 2020-06-22 DIAGNOSIS — Z8249 Family history of ischemic heart disease and other diseases of the circulatory system: Secondary | ICD-10-CM | POA: Insufficient documentation

## 2020-06-22 DIAGNOSIS — C50811 Malignant neoplasm of overlapping sites of right female breast: Secondary | ICD-10-CM | POA: Diagnosis not present

## 2020-06-22 DIAGNOSIS — Z170 Estrogen receptor positive status [ER+]: Secondary | ICD-10-CM | POA: Diagnosis not present

## 2020-06-22 DIAGNOSIS — Z803 Family history of malignant neoplasm of breast: Secondary | ICD-10-CM | POA: Insufficient documentation

## 2020-06-22 DIAGNOSIS — C50211 Malignant neoplasm of upper-inner quadrant of right female breast: Secondary | ICD-10-CM | POA: Diagnosis not present

## 2020-06-22 DIAGNOSIS — Z79810 Long term (current) use of selective estrogen receptor modulators (SERMs): Secondary | ICD-10-CM | POA: Insufficient documentation

## 2020-06-22 MED ORDER — VENLAFAXINE HCL ER 75 MG PO CP24: 75.0000 mg | ORAL_CAPSULE | Freq: Every day | ORAL | 6 refills | Status: DC

## 2020-07-22 ENCOUNTER — Encounter: Payer: Self-pay | Admitting: Oncology

## 2020-07-22 ENCOUNTER — Other Ambulatory Visit: Payer: Self-pay | Admitting: *Deleted

## 2020-07-22 MED ORDER — TAMOXIFEN CITRATE 20 MG PO TABS: 20.0000 mg | ORAL_TABLET | Freq: Every day | ORAL | 2 refills | Status: DC

## 2020-12-28 ENCOUNTER — Other Ambulatory Visit: Payer: Self-pay | Admitting: Oncology

## 2021-03-22 ENCOUNTER — Emergency Department (HOSPITAL_BASED_OUTPATIENT_CLINIC_OR_DEPARTMENT_OTHER)
Admission: EM | Admit: 2021-03-22 | Discharge: 2021-03-23 | Disposition: A | Discharge status: Home / Self Care | Payer: BC Managed Care – PPO | Attending: Emergency Medicine | Admitting: Emergency Medicine

## 2021-03-22 ENCOUNTER — Encounter (HOSPITAL_BASED_OUTPATIENT_CLINIC_OR_DEPARTMENT_OTHER): Payer: Self-pay

## 2021-03-22 ENCOUNTER — Other Ambulatory Visit: Payer: Self-pay

## 2021-03-22 DIAGNOSIS — E039 Hypothyroidism, unspecified: Secondary | ICD-10-CM | POA: Insufficient documentation

## 2021-03-22 DIAGNOSIS — Z91040 Latex allergy status: Secondary | ICD-10-CM | POA: Diagnosis not present

## 2021-03-22 DIAGNOSIS — G43809 Other migraine, not intractable, without status migrainosus: Secondary | ICD-10-CM

## 2021-03-22 DIAGNOSIS — Z79899 Other long term (current) drug therapy: Secondary | ICD-10-CM | POA: Insufficient documentation

## 2021-03-22 DIAGNOSIS — Z853 Personal history of malignant neoplasm of breast: Secondary | ICD-10-CM | POA: Insufficient documentation

## 2021-03-22 DIAGNOSIS — R519 Headache, unspecified: Secondary | ICD-10-CM | POA: Diagnosis present

## 2021-03-22 MED ORDER — DEXAMETHASONE SODIUM PHOSPHATE 10 MG/ML IJ SOLN
10.0000 mg | Freq: Once | INTRAMUSCULAR | Status: AC
  Administered 2021-03-22: 10 mg via INTRAVENOUS
  Filled 2021-03-22: qty 1

## 2021-03-22 MED ORDER — KETOROLAC TROMETHAMINE 30 MG/ML IJ SOLN
30.0000 mg | Freq: Once | INTRAMUSCULAR | Status: AC
  Administered 2021-03-22: 30 mg via INTRAVENOUS
  Filled 2021-03-22: qty 1

## 2021-03-22 MED ORDER — MAGNESIUM SULFATE 2 GM/50ML IV SOLN
2.0000 g | Freq: Once | INTRAVENOUS | Status: AC
  Administered 2021-03-22: 2 g via INTRAVENOUS
  Filled 2021-03-22: qty 50

## 2021-03-22 MED ORDER — DIPHENHYDRAMINE HCL 50 MG/ML IJ SOLN
25.0000 mg | Freq: Once | INTRAMUSCULAR | Status: AC
  Administered 2021-03-22: 25 mg via INTRAVENOUS
  Filled 2021-03-22: qty 1

## 2021-03-22 MED ORDER — SODIUM CHLORIDE 0.9 % IV BOLUS
1000.0000 mL | Freq: Once | INTRAVENOUS | Status: AC
  Administered 2021-03-22: 1000 mL via INTRAVENOUS

## 2021-03-22 MED ORDER — PROCHLORPERAZINE EDISYLATE 10 MG/2ML IJ SOLN
10.0000 mg | Freq: Once | INTRAMUSCULAR | Status: AC
  Administered 2021-03-22: 10 mg via INTRAVENOUS
  Filled 2021-03-22: qty 2

## 2021-03-22 NOTE — ED Triage Notes (Signed)
Pt with migraine HA that started this AM and has gradually gotten worse. Pt has associated photo/phonophobia.

## 2021-03-22 NOTE — ED Provider Notes (Signed)
[facility] EMERGENCY DEPT Provider Note   CSN: [id number] Arrival date & time: 03/22/21  2201     History Chief Complaint  Patient presents with   Migraine    Marie Castro is a 50 y.o. female.  HPI     50 year old female with a history of anxiety, breast cancer (2018 diagnosis) post surgery and radiation, history of migraines diagnosed by PCP in 2019, presents with concern for migraine headache.   Reports that she initially began to have a headache this morning, and took her migraine medicine at 10 AM.  He reports that the headache continued and she had to take another dose.  She started a prep for her colonoscopy at 6 PM, and since that time, her headache has slowly worsened and is now 10 out of 10.  She has associated photophobia, phonophobia, nausea and vomiting.  This headache is typical of her migraines.  Often her headache is improved by her home migraine medication, but she has had to come to the emergency department 1 other time for treatment.  Does not have fevers.  Denies numbness, weakness, visual changes, difficulty walking or talking.  Her sister has hx of severe migraines as well.  Thinks the weather change may have triggered it (has in the past) and also not eating as much, colonoscopy prep today.   Past Medical History:  Diagnosis Date   Anxiety    Cancer [facility])    breast   Family history of breast cancer    Family history of heart disease    Family history of prostate cancer    History of radiation therapy 10/04/17-11/22/17   left breast 50.4 Gy in 28 fractions, left breast boost 10 Gy in 5 fractions, right breast 50.4 Gy in 28 fractions, right breast boost 12 Gy in 6 fractions   Hypothyroidism    Thyroid disease     Patient Active Problem List   Diagnosis Date Noted   S/P partial mastectomy, bilateral 12/17/2018   Breast asymmetry following reconstructive surgery 12/17/2018   Allergic reaction 05/20/2018   Malignant neoplasm of upper-outer  quadrant of left breast in female, estrogen receptor positive ([place]) 08/29/2017   Genetic testing 07/31/2017   Malignant neoplasm of upper-inner quadrant of right breast in female, estrogen receptor positive ([place]) 07/24/2017   Family history of breast cancer    Family history of prostate cancer     Past Surgical History:  Procedure Laterality Date   BREAST BIOPSY Right 07/12/2017   BREAST LUMPECTOMY WITH RADIOACTIVE SEED AND SENTINEL LYMPH NODE BIOPSY Right 08/16/2017   Procedure: RIGHT BREAST LUMPECTOMY WITH RADIOACTIVE SEED AND RIGHT SENTINEL LYMPH NODE BIOPSY;  Surgeon: [name], MD;  Location: [place];  Service: General;  Laterality: Right;   BREAST LUMPECTOMY WITH RADIOACTIVE SEED LOCALIZATION Left 08/16/2017   Procedure: LEFT BREAST PARTIAL MASTECTOMY WITH BRACKETED RADIOACTIVE SEED LOCALIZATION;  Surgeon: [name], MD;  Location: [place];  Service: General;  Laterality: Left;   DILATION AND CURETTAGE OF UTERUS     KNEE SURGERY     MULTIPLE TOOTH EXTRACTIONS     RE-EXCISION OF BREAST LUMPECTOMY Left 08/27/2017   Procedure: LEFT RE-EXCISION OF BREAST LUMPECTOMY ERAS PATHWAY;  Surgeon: [name], MD;  Location: [place];  Service: General;  Laterality: Left;   WISDOM TOOTH EXTRACTION       OB History     Gravida  3   Para  2   Term      Preterm  0  AB      Living  2      SAB      IAB      Ectopic      Multiple      Live Births              Family History  Problem Relation Age of Onset   Cancer Mother        SKIN AND BREAST   Heart disease Mother    Heart disease Father    Hyperlipidemia Father    Depression Father    Cancer Maternal Grandmother        BREAST   Melanoma Maternal Grandmother        toe   Cancer Maternal Grandfather        PROSTATE   Irritable bowel syndrome Paternal Grandmother    Heart disease Paternal Grandfather    Breast cancer Maternal Aunt 72   Breast cancer Maternal Aunt        dx in her 41s     Social History   Tobacco Use   Smoking status: Never   Smokeless tobacco: Never  Vaping Use   Vaping Use: Never used  Substance Use Topics   Alcohol use: No   Drug use: No    Home Medications Prior to Admission medications   Medication Sig Start Date End Date Taking? Authorizing Provider  [employer] D3 25 MCG (1000 UT) capsule TAKE 1 CAPSULE BY MOUTH EVERY DAY 06/11/20   Magrinat, [name], MD  diphenhydrAMINE (BENADRYL) 12.5 MG/5ML liquid Take 37.5 mg by mouth at bedtime as needed for sleep.    [provider]  eletriptan (RELPAX) 40 MG tablet Take 1 tablet (40 mg total) by mouth as needed for migraine or headache. May repeat in 2 hours if headache persists or recurs. 09/15/19   Magrinat, [name], MD  fluticasone (FLONASE) 50 MCG/ACT nasal spray Place 1 spray into both nostrils 2 (two) times daily. 07/03/18   [provider]  ibuprofen (ADVIL,MOTRIN) 200 MG tablet Take 400-600 mg by mouth daily as needed for headache or moderate pain.    [provider]  levocetirizine (XYZAL) 5 MG tablet Take by mouth.    [provider]  levothyroxine (SYNTHROID, LEVOTHROID) 100 MCG tablet Take 100 mcg by mouth daily before breakfast.    [provider]  LORazepam (ATIVAN) 0.5 MG tablet Take 0.5 mg by mouth 2 (two) times daily as needed for anxiety.    [provider]  omalizumab Arvid Right) 150 MG injection Inject 150 mg into the skin every 28 (twenty-eight) days.    [provider]  ondansetron (ZOFRAN-ODT) 8 MG disintegrating tablet TAKE 1 TABLET (8 MG TOTAL) BY MOUTH EVERY 8 (EIGHT) HOURS AS NEEDED FOR NAUSEA OR VOMITING. 04/15/19   Magrinat, [name], MD  tamoxifen (NOLVADEX) 20 MG tablet Take 1 tablet (20 mg total) by mouth daily. 07/22/20   Magrinat, [name], MD  venlafaxine XR (EFFEXOR-XR) 37.5 MG 24 hr capsule TAKE 1 CAPSULE BY MOUTH DAILY WITH BREAKFAST. 12/28/20   Magrinat, [name], MD  venlafaxine XR (EFFEXOR-XR) 75 MG 24 hr capsule Take  1 capsule (75 mg total) by mouth daily with breakfast. 06/22/20   Magrinat, [name], MD    Allergies    Oxycodone and Latex  Review of Systems   Review of Systems  Constitutional:  Negative for fever.  HENT:  Negative for sore throat.   Eyes:  Negative for visual disturbance.  Respiratory:  Negative for cough and shortness of breath.   Cardiovascular:  Negative for chest pain.  Gastrointestinal:  Positive for nausea and vomiting. Negative for abdominal pain, constipation and diarrhea.  Genitourinary:  Negative for difficulty urinating.  Musculoskeletal:  Negative for back pain and neck pain.  Skin:  Negative for rash.  Neurological:  Positive for headaches. Negative for dizziness, syncope, facial asymmetry, weakness and numbness.   Physical Exam Updated Vital Signs BP (!) 111/53 (BP Location: Left Arm)   Pulse 72   Temp 97.7 F (36.5 C) (Oral)   Resp 20   Ht 5\' 6"  (1.676 m)   Wt 78.5 kg   SpO2 98%   BMI 27.92 kg/m   Physical Exam Constitutional:      General: She is not in acute distress.    Appearance: Normal appearance. She is not ill-appearing.  HENT:     Head: Normocephalic and atraumatic.  Eyes:     General: No visual field deficit.    Extraocular Movements: Extraocular movements intact.     Conjunctiva/sclera: Conjunctivae normal.     Pupils: Pupils are equal, round, and reactive to light.  Cardiovascular:     Rate and Rhythm: Normal rate and regular rhythm.     Pulses: Normal pulses.  Pulmonary:     Effort: Pulmonary effort is normal. No respiratory distress.  Musculoskeletal:        General: No swelling or tenderness.     Cervical back: Normal range of motion.  Skin:    General: Skin is warm and dry.     Findings: No erythema or rash.  Neurological:     General: No focal deficit present.     Mental Status: She is alert and oriented to person, place, and time.     GCS: GCS eye subscore is 4. GCS verbal subscore is 5. GCS motor subscore is 6.      Cranial Nerves: No cranial nerve deficit, dysarthria or facial asymmetry.     Sensory: No sensory deficit.     Motor: No weakness or tremor.     Coordination: Coordination normal. Finger-Nose-Finger Test normal.    ED Results / Procedures / Treatments   Labs (all labs ordered are listed, but only abnormal results are displayed) Labs Reviewed - No data to display  EKG None  Radiology No results found.  Procedures Procedures   Medications Ordered in ED Medications  magnesium sulfate IVPB 2 g 50 mL (has no administration in time range)  prochlorperazine (COMPAZINE) injection 10 mg (10 mg Intravenous Given 03/22/21 2235)  diphenhydrAMINE (BENADRYL) injection 25 mg (25 mg Intravenous Given 03/22/21 2235)  sodium chloride 0.9 % bolus 1,000 mL (0 mLs Intravenous Stopped 03/22/21 2335)  dexamethasone (DECADRON) injection 10 mg (10 mg Intravenous Given 03/22/21 2235)  ketorolac (TORADOL) 30 MG/ML injection 30 mg (30 mg Intravenous Given 03/22/21 2344)    ED Course  I have reviewed the triage vital signs and the nursing notes.  Pertinent labs & imaging results that were available during my care of the patient were reviewed by me and considered in my medical decision making (see chart for details).    MDM Rules/Calculators/A&P                           50 year old female with a history of anxiety, breast cancer (2018 diagnosis) post surgery and radiation, history of migraines diagnosed by PCP in 2019, presents with concern for migraine headache.  Headache began slowly,  no trauma, no fevers, and normal neurologic exam and have low suspicion for [facility], SDH or meningitis.  Patient was given compazine and benadryl with improvement in headache however persistence with headache 8/10. Ordered toradol, Mg and singed out to Dr. Tamera Punt with reevaluation pending.  Final Clinical Impression(s) / ED Diagnoses Final diagnoses:  Other migraine without status migrainosus, not intractable    Rx / DC  Orders ED Discharge Orders     None        Gareth [place], MD 03/22/21 2345

## 2021-03-23 NOTE — ED Provider Notes (Signed)
Patient presents with a migraine.  She describes migraine is similar to her prior migrainous type headaches.  No other symptoms that we more concerning for subarachnoid hemorrhage or meningitis.  Her headache is significantly improved after treatment in the ED.  She feels like she is ready to go home.  She has no neurologic deficits.  She was discharged home in good condition.  Return precautions were given.   Malvin Johns, MD 03/23/21 [phone]

## 2021-06-02 ENCOUNTER — Other Ambulatory Visit: Payer: Self-pay | Admitting: Oncology

## 2021-06-07 ENCOUNTER — Encounter: Payer: Self-pay | Admitting: Oncology

## 2021-06-07 ENCOUNTER — Other Ambulatory Visit: Payer: Self-pay | Admitting: Oncology

## 2021-06-21 ENCOUNTER — Other Ambulatory Visit: Payer: Self-pay | Admitting: *Deleted

## 2021-06-21 DIAGNOSIS — C50412 Malignant neoplasm of upper-outer quadrant of left female breast: Secondary | ICD-10-CM

## 2021-06-21 DIAGNOSIS — Z170 Estrogen receptor positive status [ER+]: Secondary | ICD-10-CM

## 2021-06-22 ENCOUNTER — Inpatient Hospital Stay: Payer: BC Managed Care – PPO

## 2021-06-22 ENCOUNTER — Inpatient Hospital Stay: Payer: BC Managed Care – PPO | Attending: Oncology | Admitting: Oncology

## 2021-06-22 ENCOUNTER — Other Ambulatory Visit: Payer: Self-pay

## 2021-06-22 VITALS — BP 116/62 | HR 95 | Temp 97.5°F | Resp 18 | Ht 66.0 in | Wt 182.1 lb

## 2021-06-22 DIAGNOSIS — Z79899 Other long term (current) drug therapy: Secondary | ICD-10-CM | POA: Insufficient documentation

## 2021-06-22 DIAGNOSIS — Z79810 Long term (current) use of selective estrogen receptor modulators (SERMs): Secondary | ICD-10-CM | POA: Diagnosis not present

## 2021-06-22 DIAGNOSIS — Z923 Personal history of irradiation: Secondary | ICD-10-CM | POA: Insufficient documentation

## 2021-06-22 DIAGNOSIS — C50211 Malignant neoplasm of upper-inner quadrant of right female breast: Secondary | ICD-10-CM | POA: Insufficient documentation

## 2021-06-22 DIAGNOSIS — E039 Hypothyroidism, unspecified: Secondary | ICD-10-CM | POA: Insufficient documentation

## 2021-06-22 DIAGNOSIS — Z170 Estrogen receptor positive status [ER+]: Secondary | ICD-10-CM | POA: Diagnosis not present

## 2021-06-22 DIAGNOSIS — C50412 Malignant neoplasm of upper-outer quadrant of left female breast: Secondary | ICD-10-CM

## 2021-06-22 LAB — CMP (CANCER CENTER ONLY)
ALT: 17 U/L (ref 0–44)
AST: 17 U/L (ref 15–41)
Albumin: 3.9 g/dL (ref 3.5–5.0)
Alkaline Phosphatase: 64 U/L (ref 38–126)
Anion gap: 7 (ref 5–15)
BUN: 9 mg/dL (ref 6–20)
CO2: 29 mmol/L (ref 22–32)
Calcium: 9.1 mg/dL (ref 8.9–10.3)
Chloride: 105 mmol/L (ref 98–111)
Creatinine: 0.85 mg/dL (ref 0.44–1.00)
GFR, Estimated: >60 mL/min (ref >60)
Glucose, Bld: 75 mg/dL (ref 70–99)
Potassium: 4.4 mmol/L (ref 3.5–5.1)
Sodium: 141 mmol/L (ref 135–145)
Total Bilirubin: <0.2 mg/dL — ABNORMAL LOW (ref 0.3–1.2)
Total Protein: 7 g/dL (ref 6.5–8.1)

## 2021-06-22 LAB — CBC WITH DIFFERENTIAL (CANCER CENTER ONLY)
Abs Immature Granulocytes: 0.01 10*3/uL (ref 0.00–0.07)
Basophils Absolute: 0.1 10*3/uL (ref 0.0–0.1)
Basophils Relative: 1 %
Eosinophils Absolute: 0.7 10*3/uL — ABNORMAL HIGH (ref 0.0–0.5)
Eosinophils Relative: 12 %
HCT: 38.5 % (ref 36.0–46.0)
Hemoglobin: 12.8 g/dL (ref 12.0–15.0)
Immature Granulocytes: 0 %
Lymphocytes Relative: 31 %
Lymphs Abs: 1.7 10*3/uL (ref 0.7–4.0)
MCH: 28.9 pg (ref 26.0–34.0)
MCHC: 33.2 g/dL (ref 30.0–36.0)
MCV: 86.9 fL (ref 80.0–100.0)
Monocytes Absolute: 0.7 10*3/uL (ref 0.1–1.0)
Monocytes Relative: 13 %
Neutro Abs: 2.3 10*3/uL (ref 1.7–7.7)
Neutrophils Relative %: 43 %
Platelet Count: 283 10*3/uL (ref 150–400)
RBC: 4.43 MIL/uL (ref 3.87–5.11)
RDW: 12.3 % (ref 11.5–15.5)
WBC Count: 5.5 10*3/uL (ref 4.0–10.5)
nRBC: 0 % (ref 0.0–0.2)

## 2021-06-22 NOTE — Progress Notes (Signed)
[place]  [phone] [phone]     ID: Marie Castro DOB: 09/12/1971  MR#: [id number]  EML#:[id number]  Patient Care Team: [name], [place] as PCP - General (Nurse Practitioner) Dai Apel, [name], MD as Consulting Physician (Oncology) [name], MD as Consulting Physician (General Surgery) [name], MD as Consulting Physician (Radiation Oncology) [name], PA-C as Physician Assistant (Obstetrics and Gynecology) Register, Luetta Nutting, PA-C as Physician Assistant (Dermatology) Tiajuana Amass, MD as Referring Physician (Allergy and Immunology) OTHER MD:  CHIEF COMPLAINT: Invasive lobular breast cancer, estrogen receptor positive  CURRENT TREATMENT: tamoxifen   INTERVAL HISTORY: Marie Castro returns today for follow-up of her estrogen receptor positive breast cancer.   She continues on tamoxifen.  She tolerates this generally well, although her hot flashes have been a little bit more pronounced lately.  They decreased when we started her on venlafaxine initially at 37.5.  She continues on that but with less effectiveness.  Since her last visit, she underwent bilateral diagnostic mammography with tomography at [facility] on 06/09/2021 showing: breast density category B; no evidence of malignancy in either breast.    REVIEW OF SYSTEMS: Detailed review of systems today was otherwise noncontributory   COVID 19 VACCINATION STATUS: Pfizer x2 and 1 booster.   HISTORY OF CURRENT ILLNESS: From the original intake note:  Marie Castro had bilateral screening mammography at [facility] 07/06/2017, showing a new 1.5 cm oval mass in the right breast upper inner quadrant, a possible development asymmetry in the left breast upper outer quadrant, and a 0.3 cm area of calcifications in the left breast upper outer quadrant. On 07/11/2017 the patient underwent left diagnostic mammography with tomography and bilateral breast ultrasonography. The left breast mammography showed the  breast density to be category C. In the upper-outer quadrant of the left breast there was a 0.8 cm oval mass which by ultrasound was a benign complicated cyst. There was also a 0.3 cm area of grouped calcifications in the left breast upper outer quadrant.  In the right breast, ultrasonography showed an irregular mass in the upper inner quadrant adjacent to a benign 1.5 cm simple cyst.  On 07/12/2017 she underwent bilateral biopsies. On the right there was an invasive lobular carcinoma, grade 1 or 2, estrogen receptor 80% positive with moderate staining intensity, progesterone receptor 95% positive with strong staining intensity, with an MIB-1 of 1%, and no HER-2 amplification, the signals ratio being 1.12 and the number per cell 1.85. ([id number])  On the left the biopsy showed atypical lobular hyperplasia.  Both axillae were sonographically benign  The patient's subsequent history is as detailed below.   PAST MEDICAL HISTORY: Past Medical History:  Diagnosis Date   Anxiety    Cancer [facility])    breast   Family history of breast cancer    Family history of heart disease    Family history of prostate cancer    History of radiation therapy 10/04/17-11/22/17   left breast 50.4 Gy in 28 fractions, left breast boost 10 Gy in 5 fractions, right breast 50.4 Gy in 28 fractions, right breast boost 12 Gy in 6 fractions   Hypothyroidism    Thyroid disease     PAST SURGICAL HISTORY: Past Surgical History:  Procedure Laterality Date   BREAST BIOPSY Right 07/12/2017   BREAST LUMPECTOMY WITH RADIOACTIVE SEED AND SENTINEL LYMPH NODE BIOPSY Right 08/16/2017   Procedure: RIGHT BREAST LUMPECTOMY WITH RADIOACTIVE SEED AND RIGHT SENTINEL LYMPH NODE BIOPSY;  Surgeon: [name], MD;  Location: [place];  Service: General;  Laterality: Right;   BREAST LUMPECTOMY WITH RADIOACTIVE SEED LOCALIZATION Left 08/16/2017   Procedure: LEFT BREAST PARTIAL MASTECTOMY WITH BRACKETED RADIOACTIVE SEED LOCALIZATION;   Surgeon: [name], MD;  Location: [place];  Service: General;  Laterality: Left;   DILATION AND CURETTAGE OF UTERUS     KNEE SURGERY     MULTIPLE TOOTH EXTRACTIONS     RE-EXCISION OF BREAST LUMPECTOMY Left 08/27/2017   Procedure: LEFT RE-EXCISION OF BREAST LUMPECTOMY ERAS PATHWAY;  Surgeon: [name], MD;  Location: [place];  Service: General;  Laterality: Left;   WISDOM TOOTH EXTRACTION      FAMILY HISTORY Family History  Problem Relation Age of Onset   Cancer Mother        SKIN AND BREAST   Heart disease Mother    Heart disease Father    Hyperlipidemia Father    Depression Father    Cancer Maternal Grandmother        BREAST   Melanoma Maternal Grandmother        toe   Cancer Maternal Grandfather        PROSTATE   Irritable bowel syndrome Paternal Grandmother    Heart disease Paternal Grandfather    Breast cancer Maternal Aunt 72   Breast cancer Maternal Aunt        dx in her 61s  The patient's father is 8 year old and her mother 5 year old as of October 2018. The patient has one sister, no brothers. The patient's mother and mother's mother both had breast cancer in their 21s. A maternal aunt had breast cancer at age 16, and another at age 80. The patient's mother has had genetics testing, which was negative. Please also consulted the detailed genetics pedigree in [place]:  No LMP recorded. Menarche age 10, first live birth age 65, she is still premenopausal, with regular periods lasting approximately 5 days, of which 3 are heavy. She used oral contraceptives approximately 2 years with no complications and also had a subcutaneous depot contraceptive briefly.  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 Her husband Hospital doctor ("Chip") is a Control and instrumentation engineer for [employer]. Their son, Marie Castro, is 29 as  of September 2022 and is going to be going to college studying business.  And daughter Marie Castro is 23 as of September 2022 the patient attends the [employer] DIRECTIVES: In the absence of any documentation to the contrary, the patient's spouse is their HCPOA.   HEALTH MAINTENANCE: Social History   Tobacco Use   Smoking status: Never   Smokeless tobacco: Never  Vaping Use   Vaping Use: Never used  Substance Use Topics   Alcohol use: No   Drug use: No     Colonoscopy: Never  PAP:  Bone density: Never   Allergies  Allergen Reactions   Oxycodone Other (See Comments)    Pt states could not sleep at all, was up 24 hrs   Latex Rash    Pt states rubber bands from her braces causes ulcers in her mouth    Current Outpatient Medications  Medication Sig Dispense Refill   [employer] D3 25 MCG (1000 UT) capsule TAKE 1 CAPSULE BY MOUTH EVERY DAY 120 capsule 4   diphenhydrAMINE (BENADRYL) 12.5 MG/5ML liquid Take 37.5 mg by mouth at bedtime as needed for sleep.     eletriptan (RELPAX) 40  MG tablet Take 1 tablet (40 mg total) by mouth as needed for migraine or headache. May repeat in 2 hours if headache persists or recurs.     fluticasone (FLONASE) 50 MCG/ACT nasal spray Place 1 spray into both nostrils 2 (two) times daily.  5   ibuprofen (ADVIL,MOTRIN) 200 MG tablet Take 400-600 mg by mouth daily as needed for headache or moderate pain.     levocetirizine (XYZAL) 5 MG tablet Take by mouth.     levothyroxine (SYNTHROID, LEVOTHROID) 100 MCG tablet Take 100 mcg by mouth daily before breakfast.     LORazepam (ATIVAN) 0.5 MG tablet Take 0.5 mg by mouth 2 (two) times daily as needed for anxiety.     omalizumab (XOLAIR) 150 MG injection Inject 150 mg into the skin every 28 (twenty-eight) days.     ondansetron (ZOFRAN-ODT) 8 MG disintegrating tablet TAKE 1 TABLET (8 MG TOTAL) BY MOUTH EVERY 8 (EIGHT) HOURS AS NEEDED FOR NAUSEA OR VOMITING. 18 tablet 3   tamoxifen (NOLVADEX) 20 MG tablet TAKE  1 TABLET BY MOUTH EVERY DAY 90 tablet 2   venlafaxine XR (EFFEXOR-XR) 37.5 MG 24 hr capsule TAKE 1 CAPSULE BY MOUTH DAILY WITH BREAKFAST. 90 capsule 4   venlafaxine XR (EFFEXOR-XR) 75 MG 24 hr capsule Take 1 capsule (75 mg total) by mouth daily with breakfast. 60 capsule 6   No current facility-administered medications for this visit.    OBJECTIVE: White woman in no acute distress  Vitals:   06/22/21 1536  BP: 116/62  Pulse: 95  Resp: 18  Temp: (!) 97.5 F (36.4 C)  SpO2: 99%      Body mass index is 29.39 kg/m.   Wt Readings from Last 3 Encounters:  06/22/21 182 lb 1.6 oz (82.6 kg)  03/22/21 173 lb (78.5 kg)  06/22/20 179 lb 1.6 oz (81.2 kg)      ECOG FS:1 - Symptomatic but completely ambulatory  Sclerae unicteric, EOMs intact Wearing a mask No cervical or supraclavicular adenopathy Lungs no rales or rhonchi Heart regular rate and rhythm Abd soft, nontender, positive bowel sounds MSK no focal spinal tenderness, no upper extremity lymphedema Neuro: nonfocal, well oriented, appropriate affect Breasts: Status post bilateral lumpectomies and bilateral radiation.  There is no evidence of local recurrence.  Both axillae are benign.   LAB RESULTS:  CMP     Component Value Date/Time   NA 141 06/22/2021 1504   K 4.4 06/22/2021 1504   CL 105 06/22/2021 1504   CO2 29 06/22/2021 1504   GLUCOSE 75 06/22/2021 1504   BUN 9 06/22/2021 1504   CREATININE 0.85 06/22/2021 1504   CALCIUM 9.1 06/22/2021 1504   PROT 7.0 06/22/2021 1504   ALBUMIN 3.9 06/22/2021 1504   AST 17 06/22/2021 1504   ALT 17 06/22/2021 1504   ALKPHOS 64 06/22/2021 1504   BILITOT <0.2 (L) 06/22/2021 1504   GFRNONAA >60 06/22/2021 1504   GFRAA >60 06/08/2020 1607   GFRAA >60 11/13/2017 1512    No results found for: TOTALPROTELP, ALBUMINELP, A1GS, A2GS, BETS, BETA2SER, GAMS, MSPIKE, SPEI  No results found for: KPAFRELGTCHN, LAMBDASER, KAPLAMBRATIO  Lab Results  Component Value Date   WBC 5.5  06/22/2021   NEUTROABS 2.3 06/22/2021   HGB 12.8 06/22/2021   HCT 38.5 06/22/2021   MCV 86.9 06/22/2021   PLT 283 06/22/2021      Chemistry      Component Value Date/Time   NA 141 06/22/2021 1504   K 4.4 06/22/2021 1504  CL 105 06/22/2021 1504   CO2 29 06/22/2021 1504   BUN 9 06/22/2021 1504   CREATININE 0.85 06/22/2021 1504      Component Value Date/Time   CALCIUM 9.1 06/22/2021 1504   ALKPHOS 64 06/22/2021 1504   AST 17 06/22/2021 1504   ALT 17 06/22/2021 1504   BILITOT <0.2 (L) 06/22/2021 1504       No results found for: LABCA2  No components found for: UXLKGM010  No results for input(s): INR in the last 168 hours.  No results found for: LABCA2  No results found for: UVO536  No results found for: UYQ034  No results found for: VQQ595  No results found for: CA2729  No components found for: HGQUANT  No results found for: CEA1 / No results found for: CEA1   No results found for: AFPTUMOR  No results found for: CHROMOGRNA  No results found for: HGBA, HGBA2QUANT, HGBFQUANT, HGBSQUAN (Hemoglobinopathy evaluation)   No results found for: LDH  No results found for: IRON, TIBC, IRONPCTSAT (Iron and TIBC)  No results found for: FERRITIN  Urinalysis No results found for: COLORURINE, APPEARANCEUR, LABSPEC, PHURINE, GLUCOSEU, HGBUR, BILIRUBINUR, KETONESUR, PROTEINUR, UROBILINOGEN, NITRITE, LEUKOCYTESUR   STUDIES: No results found.   ELIGIBLE FOR AVAILABLE RESEARCH PROTOCOL: no  ASSESSMENT: 51 y.o. [place] woman status post right breast upper inner quadrant biopsy 07/11/2017 for a clinical T1c N0, stage IA invasive lobular carcinoma, E-cadherin negative, grade 1 or 2, estrogen and progesterone receptor positive, HER-2 nonamplified, with an MIB-1 of 1%  (1) biopsy of left breast upper outer quadrant calcifications 07/11/2017 showed atypical lobular hyperplasia  (a) breast MRI 07/19/2017 shows 2 additional areas of concern in the left breast, with MRI  guided biopsy 07/25/2017 showing ductal carcinoma in situ x2, low-grade, estrogen and progesterone receptor strongly positive.  (2) genetics testing 07/27/2017 through the STAT Breast cancer panel offered by [facility] found no deleterious mutations in ATM, BRCA1, BRCA2, CDH1, CHEK2, PALB2, PTEN, STK11 and TP53.   Reflexed to the Multi-Gene Panel offered by [facility] finding no deleterious mutations in ALK, APC, ATM, AXIN2,BAP1,  BARD1, BLM, BMPR1A, BRCA1, BRCA2, BRIP1, CASR, CDC73, CDH1, CDK4, CDKN1B, CDKN1C, CDKN2A (p14ARF), CDKN2A (p16INK4a), CEBPA, CHEK2, CTNNA1, DICER1, DIS3L2, EGFR (c.2369C>T, p.Thr790Met variant only), EPCAM (Deletion/duplication testing only), FH, FLCN, GATA2, GPC3, GREM1 (Promoter region deletion/duplication testing only), HOXB13 (c.251G>A, p.Gly84Glu), HRAS, KIT, MAX, MEN1, MET, MITF (c.952G>A, p.Glu318Lys variant only), MLH1, MSH2, MSH3, MSH6, MUTYH, NBN, NF1, NF2, NTHL1, PALB2, PDGFRA, PHOX2B, PMS2, POLD1, POLE, POT1, PRKAR1A, PTCH1, PTEN, RAD50, RAD51C, RAD51D, RB1, RECQL4, RET, RUNX1, SDHAF2, SDHA (sequence changes only), SDHB, SDHC, SDHD, SMAD4, SMARCA4, SMARCB1, SMARCE1, STK11, SUFU, TERT, TERT, TMEM127, TP53, TSC1, TSC2, VHL, WRN and WT1.   (3) tamoxifen started neoadjuvantly 07/24/2017  (a) held July 2019 secondary to hives, resumed November 2019  [(b) Bx of hives = urticaria, treated with omalizumab]  (4) status post right lumpectomy and sentinel lymph node sampling 08/16/2017 for a pT1c pN0, stage IA invasive lobular carcinoma, grade 2, total of 3 sentinel nodes removed  (5) status post left lumpectomy 08/16/2017 for ductal carcinoma in situ, low-grade, with focally positive margins, Tis NX.  (a) additional surgery for margin clearance 08/27/2017 was successful ([id number])  (6) Oncotype DX score of 12 predicts a 10-year risk of recurrence outside the breast of 8% if the patient's only systemic therapy is tamoxifen for 5 years.  It also predicts no benefit from  chemotherapy.  (7) adjuvant radiation 10/04/2018 to 11/22/2017: 50.4 Gy to the left breast  with a 10 Gy boost as well as  50.4 Gy to the right breast with a 12 Gy boost  (8) to continue tamoxifen a minimum of 5 years, then consider 2 years additional aromatase inhibitor treatment  (a) on 06/08/2020 FSH was 44.4 with estradiol 60.3 (perimenopausal).   PLAN: Ajai is now just about 4 years out from definitive surgery for her breast cancer with no evidence of disease recurrence.  This is very favorable.  She is tolerating tamoxifen well and the plan is to continue that a minimum of 5 years.  We again discussed diet and exercise issues.  She will see [place] again in 1 year and that may will be her "graduation" visit.  Total encounter time 20 minutes.   Casi Westerfeld, [name], MD  06/23/21 8:40 PM Medical Oncology and Hematology [facility] [place], [place] [address] Tel. [phone]    Fax. [phone]   I, Wilburn Mylar am acting as scribe for Dr. [name]. Anaalicia Reimann.  I, Lurline Del MD, have reviewed the above documentation for accuracy and completeness, and I agree with the above.   *Total Encounter Time as defined by the Centers for Medicare and Medicaid Services includes, in addition to the face-to-face time of a patient visit (documented in the note above) non-face-to-face time: obtaining and reviewing outside history, ordering and reviewing medications, tests or procedures, care coordination (communications with other health care professionals or caregivers) and documentation in the medical record.

## 2021-07-02 ENCOUNTER — Encounter: Payer: Self-pay | Admitting: Oncology

## 2021-08-01 ENCOUNTER — Other Ambulatory Visit: Payer: Self-pay | Admitting: Oncology

## 2021-08-02 ENCOUNTER — Other Ambulatory Visit: Payer: Self-pay | Admitting: *Deleted

## 2021-08-02 MED ORDER — VENLAFAXINE HCL ER 75 MG PO CP24: 75.0000 mg | ORAL_CAPSULE | Freq: Every day | ORAL | 6 refills | Status: DC

## 2021-10-12 ENCOUNTER — Ambulatory Visit
Admission: RE | Admit: 2021-10-12 | Discharge: 2021-10-12 | Disposition: A | Discharge status: Home / Self Care | Payer: BC Managed Care – PPO | Source: Ambulatory Visit | Attending: Physician Assistant | Admitting: Physician Assistant

## 2021-10-12 ENCOUNTER — Other Ambulatory Visit: Payer: Self-pay | Admitting: Physician Assistant

## 2021-10-12 DIAGNOSIS — R079 Chest pain, unspecified: Secondary | ICD-10-CM

## 2021-11-15 NOTE — Progress Notes (Signed)
Cardiology Office Note:    Date:  11/16/2021   ID:  Marie Castro, DOB 1970/11/23, MRN [id number]  PCP:  [name], PA   [facility] Providers Cardiologist:  Werner Lean, MD     Referring MD: [name], PA   CC: risk stratification Consulted for the evaluation of CAD at the [place] [facility] [place], [place], [place]  History of Present Illness:    Marie Castro is a 51 y.o. 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  Patient notes that she is feeling chest pressure.  Used to think it was just stress.  Around Christmas she had new sternal chest pain that radiated to her back (happened at church).  Three days later came on for over 30 minutes and was about to go to EMS.  Saw PCP in interim.  Started on reflux medications and resolved this issues.  Is doing well with TUMS.  Notes no SOB, DOE, or palpitations.  No history of pre-eclampsia, gestation HTN or gestational DM.      Past Medical History:  Diagnosis Date   Anxiety    Cancer ([place])    breast   Family history of breast cancer    Family history of heart disease    Family history of prostate cancer    History of radiation therapy 10/04/17-11/22/17   left breast 50.4 Gy in 28 fractions, left breast boost 10 Gy in 5 fractions, right breast 50.4 Gy in 28 fractions, right breast boost 12 Gy in 6 fractions   Hypothyroidism    Idiopathic urticaria    Malignant neoplasm of upper-outer quadrant of left female breast ([name])    Migraine    Overweight    Thyroid disease     Past Surgical History:  Procedure Laterality Date   BREAST BIOPSY Right 07/12/2017   BREAST LUMPECTOMY WITH RADIOACTIVE SEED AND SENTINEL LYMPH NODE BIOPSY Right 08/16/2017   Procedure: RIGHT BREAST LUMPECTOMY WITH RADIOACTIVE SEED AND RIGHT SENTINEL LYMPH NODE BIOPSY;  Surgeon: [name], MD;  Location: [place];  Service: General;  Laterality: Right;   BREAST LUMPECTOMY WITH  RADIOACTIVE SEED LOCALIZATION Left 08/16/2017   Procedure: LEFT BREAST PARTIAL MASTECTOMY WITH BRACKETED RADIOACTIVE SEED LOCALIZATION;  Surgeon: [name], MD;  Location: [place];  Service: General;  Laterality: Left;   DILATION AND CURETTAGE OF UTERUS     KNEE SURGERY     MULTIPLE TOOTH EXTRACTIONS     RE-EXCISION OF BREAST LUMPECTOMY Left 08/27/2017   Procedure: LEFT RE-EXCISION OF BREAST LUMPECTOMY ERAS PATHWAY;  Surgeon: [name], MD;  Location: [place];  Service: General;  Laterality: Left;   WISDOM TOOTH EXTRACTION      Current Medications: Current Meds  Medication Sig   CLENPIQ 10-3.5-12 MG-GM -GM/160ML SOLN See admin instructions.   [employer]S D3 25 MCG (1000 UT) capsule TAKE 1 CAPSULE BY MOUTH EVERY DAY   eletriptan (RELPAX) 40 MG tablet Take 1 tablet (40 mg total) by mouth as needed for migraine or headache. May repeat in 2 hours if headache persists or recurs.   EPINEPHrine 0.3 mg/0.3 mL IJ SOAJ injection as needed.   Galcanezumab-gnlm 100 MG/ML SOSY Inject into the skin every 30 (thirty) days.   ibuprofen (ADVIL,MOTRIN) 200 MG tablet Take 400-600 mg by mouth daily as needed for headache or moderate pain.   levocetirizine (XYZAL) 5 MG tablet Take by mouth.   levothyroxine (SYNTHROID, LEVOTHROID) 100  MCG tablet Take 100 mcg by mouth daily before breakfast.   LORazepam (ATIVAN) 0.5 MG tablet Take 0.5 mg by mouth 2 (two) times daily as needed for anxiety.   omalizumab (XOLAIR) 150 MG injection Inject 150 mg into the skin every 28 (twenty-eight) days.   ondansetron (ZOFRAN-ODT) 8 MG disintegrating tablet TAKE 1 TABLET (8 MG TOTAL) BY MOUTH EVERY 8 (EIGHT) HOURS AS NEEDED FOR NAUSEA OR VOMITING.   tamoxifen (NOLVADEX) 20 MG tablet TAKE 1 TABLET BY MOUTH EVERY DAY   venlafaxine XR (EFFEXOR-XR) 75 MG 24 hr capsule Take 1 capsule (75 mg total) by mouth daily with breakfast.     Allergies:   Oxycodone, Celecoxib, and Latex   Social History   Socioeconomic History    Marital status: Married    Spouse name: Not on file   Number of children: 2   Years of education: Not on file   Highest education level: Not on file  Occupational History   Not on file  Tobacco Use   Smoking status: Never   Smokeless tobacco: Never  Vaping Use   Vaping Use: Never used  Substance and Sexual Activity   Alcohol use: No   Drug use: No   Sexual activity: Yes  Other Topics Concern   Not on file  Social History Narrative   Not on file   Social Determinants of Health   Financial Resource Strain: Not on file  Food Insecurity: Not on file  Transportation Needs: Not on file  Physical Activity: Not on file  Stress: Not on file  Social Connections: Not on file    Social: FH of CAD (father) who went here  Family History: The patient's family history includes Breast cancer in her maternal aunt; Breast cancer (age of onset: 65) in her maternal aunt; Cancer in her maternal grandfather, maternal grandmother, and mother; Depression in her father; Heart disease in her father, mother, and paternal grandfather; Hyperlipidemia in her father; Irritable bowel syndrome in her paternal grandmother; Melanoma in her maternal grandmother.  ROS:   Please see the history of present illness.     All other systems reviewed and are negative.  EKGs/Labs/Other Studies Reviewed:    The following studies were reviewed today:  EKG:  EKG is  ordered today.  The ekg ordered today demonstrates  11/16/21: SR rate 87  Recent Labs: 06/22/2021: ALT 17; BUN 9; Creatinine 0.85; Hemoglobin 12.8; Platelet Count 283; Potassium 4.4; Sodium 141  Recent Lipid Panel No results found for: CHOL, TRIG, HDL, CHOLHDL, VLDL, LDLCALC, LDLDIRECT       Physical Exam:    VS:  BP 123/77    Pulse 87    Ht 5\' 6"  (1.676 m)    Wt 82.1 kg    LMP 04/08/2018    SpO2 99%    BMI 29.21 kg/m     Wt Readings from Last 3 Encounters:  11/16/21 82.1 kg  06/22/21 82.6 kg  03/22/21 78.5 kg     Gen: No distress Neck:  No JVD Cardiac: No Rubs or Gallops, no Murmur, regular rate and rhythm +2 radial pulses Respiratory: Clear to auscultation bilaterally, normal effort, normal  respiratory rate GI: Soft, nontender, non-distended  MS: No  edema;  moves all extremities Integument: Skin feels warm Neuro:  At time of evaluation, alert and oriented to person/place/time/situation  Psych: Normal affect, patient feels   ASSESSMENT:    1. Family history of early CAD    PLAN:    Breast Cancer s/p High  radiation dose HLD FH early CAD - will get CAC to decide LDL goal (< 55, vs < 70) - will seen in 3-4 months to review imaging    Medication Adjustments/Labs and Tests Ordered: Current medicines are reviewed at length with the patient today.  Concerns regarding medicines are outlined above.  Orders Placed This Encounter  Procedures   CT CARDIAC SCORING (SELF PAY ONLY)   EKG 12-Lead   No orders of the defined types were placed in this encounter.   Patient Instructions  Medication Instructions:  Your physician recommends that you continue on your current medications as directed. Please refer to the Current Medication list given to you today.  *If you need a refill on your cardiac medications before your next appointment, please call your pharmacy*   Lab Work: NONE If you have labs (blood work) drawn today and your tests are completely normal, you will receive your results only by: [place] (if you have MyChart) OR A paper copy in the mail If you have any lab test that is abnormal or we need to change your treatment, we will call you to review the results.   Testing/Procedures: Your physician has ordered you to have a Coronary Artery Calcium Scoring of your heart. It will calculate your risk of developing Coronary Artery Disease (CAD) by measuring the amount of buildup of calcium in the plaque in the coronary arteries (arteries surrounding your heart).     Follow-Up: At [facility], you  and your health needs are our priority.  As part of our continuing mission to provide you with exceptional heart care, we have created designated Provider Care Teams.  These Care Teams include your primary Cardiologist (physician) and Advanced Practice Providers (APPs -  Physician Assistants and Nurse Practitioners) who all work together to provide you with the care you need, when you need it.   Your next appointment:   3 - 4  month(s)  The format for your next appointment:   In Person  Provider:   Werner Lean, MD        Signed, Werner Lean, MD  11/16/2021 5:40 PM    [place]

## 2021-11-16 ENCOUNTER — Ambulatory Visit: Payer: BC Managed Care – PPO | Admitting: Internal Medicine

## 2021-11-16 ENCOUNTER — Other Ambulatory Visit: Payer: Self-pay

## 2021-11-16 ENCOUNTER — Encounter: Payer: Self-pay | Admitting: Internal Medicine

## 2021-11-16 VITALS — BP 123/77 | HR 87 | Ht 66.0 in | Wt 181.0 lb

## 2021-11-16 DIAGNOSIS — Z8249 Family history of ischemic heart disease and other diseases of the circulatory system: Secondary | ICD-10-CM

## 2021-11-16 NOTE — Patient Instructions (Signed)
Medication Instructions:  Your physician recommends that you continue on your current medications as directed. Please refer to the Current Medication list given to you today.  *If you need a refill on your cardiac medications before your next appointment, please call your pharmacy*   Lab Work: NONE If you have labs (blood work) drawn today and your tests are completely normal, you will receive your results only by: [place] (if you have MyChart) OR A paper copy in the mail If you have any lab test that is abnormal or we need to change your treatment, we will call you to review the results.   Testing/Procedures: Your physician has ordered you to have a Coronary Artery Calcium Scoring of your heart. It will calculate your risk of developing Coronary Artery Disease (CAD) by measuring the amount of buildup of calcium in the plaque in the coronary arteries (arteries surrounding your heart).     Follow-Up: At [facility], you and your health needs are our priority.  As part of our continuing mission to provide you with exceptional heart care, we have created designated Provider Care Teams.  These Care Teams include your primary Cardiologist (physician) and Advanced Practice Providers (APPs -  Physician Assistants and Nurse Practitioners) who all work together to provide you with the care you need, when you need it.   Your next appointment:   3 - 4  month(s)  The format for your next appointment:   In Person  Provider:   Werner Lean, MD

## 2021-12-02 ENCOUNTER — Other Ambulatory Visit: Payer: Self-pay

## 2021-12-02 MED ORDER — VENLAFAXINE HCL ER 75 MG PO CP24: 75.0000 mg | ORAL_CAPSULE | Freq: Every day | ORAL | 2 refills | Status: DC

## 2022-01-02 ENCOUNTER — Other Ambulatory Visit: Payer: Self-pay

## 2022-01-02 ENCOUNTER — Ambulatory Visit (INDEPENDENT_AMBULATORY_CARE_PROVIDER_SITE_OTHER)
Admission: RE | Admit: 2022-01-02 | Discharge: 2022-01-02 | Disposition: A | Discharge status: Home / Self Care | Payer: Self-pay | Source: Ambulatory Visit | Attending: Internal Medicine | Admitting: Internal Medicine

## 2022-01-02 DIAGNOSIS — Z8249 Family history of ischemic heart disease and other diseases of the circulatory system: Secondary | ICD-10-CM

## 2022-03-03 ENCOUNTER — Other Ambulatory Visit: Payer: Self-pay | Admitting: *Deleted

## 2022-03-03 MED ORDER — TAMOXIFEN CITRATE 20 MG PO TABS: 20.0000 mg | ORAL_TABLET | Freq: Every day | ORAL | 2 refills | Status: DC

## 2022-03-12 NOTE — Progress Notes (Unsigned)
Cardiology Office Note:    Date:  03/13/2022   ID:  Marie Castro, DOB 16-Jan-1971, MRN [id number]  PCP:  [name], PA   [facility] Providers Cardiologist:  Werner Lean, MD     Referring MD: [name], PA   CC: risk stratification  History of Present Illness:    Marie Castro is a 51 y.o. 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  Patient notes that she is feeling chest pressure.  Used to think it was just stress.  Around Christmas she had new sternal chest pain that radiated to her back (happened at church).  Three days later came on for over 30 minutes and was about to go to EMS.  Saw PCP in interim.  Started on reflux medications and resolved this issues.  Is doing well with TUMS.  Notes no SOB, DOE, or palpitations.  No history of pre-eclampsia, gestation HTN or gestational DM.    Past Medical History:  Diagnosis Date   Anxiety    Cancer [facility])    breast   Family history of breast cancer    Family history of heart disease    Family history of prostate cancer    History of radiation therapy 10/04/17-11/22/17   left breast 50.4 Gy in 28 fractions, left breast boost 10 Gy in 5 fractions, right breast 50.4 Gy in 28 fractions, right breast boost 12 Gy in 6 fractions   Hypothyroidism    Idiopathic urticaria    Malignant neoplasm of upper-outer quadrant of left female breast ([place])    Migraine    Overweight    Thyroid disease     Past Surgical History:  Procedure Laterality Date   BREAST BIOPSY Right 07/12/2017   BREAST LUMPECTOMY WITH RADIOACTIVE SEED AND SENTINEL LYMPH NODE BIOPSY Right 08/16/2017   Procedure: RIGHT BREAST LUMPECTOMY WITH RADIOACTIVE SEED AND RIGHT SENTINEL LYMPH NODE BIOPSY;  Surgeon: [name], MD;  Location: [place];  Service: General;  Laterality: Right;   BREAST LUMPECTOMY WITH RADIOACTIVE SEED LOCALIZATION Left 08/16/2017   Procedure: LEFT BREAST PARTIAL  MASTECTOMY WITH BRACKETED RADIOACTIVE SEED LOCALIZATION;  Surgeon: [name], MD;  Location: [place];  Service: General;  Laterality: Left;   DILATION AND CURETTAGE OF UTERUS     KNEE SURGERY     MULTIPLE TOOTH EXTRACTIONS     RE-EXCISION OF BREAST LUMPECTOMY Left 08/27/2017   Procedure: LEFT RE-EXCISION OF BREAST LUMPECTOMY ERAS PATHWAY;  Surgeon: [name], MD;  Location: [place];  Service: General;  Laterality: Left;   WISDOM TOOTH EXTRACTION      Current Medications: Current Meds  Medication Sig   [employer] D3 25 MCG (1000 UT) capsule TAKE 1 CAPSULE BY MOUTH EVERY DAY   eletriptan (RELPAX) 40 MG tablet Take 1 tablet (40 mg total) by mouth as needed for migraine or headache. May repeat in 2 hours if headache persists or recurs.   EPINEPHrine 0.3 mg/0.3 mL IJ SOAJ injection as needed.   Galcanezumab-gnlm 100 MG/ML SOSY Inject into the skin every 30 (thirty) days.   ibuprofen (ADVIL,MOTRIN) 200 MG tablet Take 400-600 mg by mouth daily as needed for headache or moderate pain.   levocetirizine (XYZAL) 5 MG tablet Take by mouth.   levothyroxine (SYNTHROID, LEVOTHROID) 100 MCG tablet Take 100 mcg by mouth daily before breakfast.   LORazepam (ATIVAN) 0.5 MG tablet Take 0.5 mg by mouth 2 (two) times  daily as needed for anxiety.   omalizumab (XOLAIR) 150 MG injection Inject 150 mg into the skin every 28 (twenty-eight) days.   ondansetron (ZOFRAN-ODT) 8 MG disintegrating tablet TAKE 1 TABLET (8 MG TOTAL) BY MOUTH EVERY 8 (EIGHT) HOURS AS NEEDED FOR NAUSEA OR VOMITING.   tamoxifen (NOLVADEX) 20 MG tablet Take 1 tablet (20 mg total) by mouth daily.   venlafaxine XR (EFFEXOR-XR) 75 MG 24 hr capsule Take 1 capsule (75 mg total) by mouth daily with breakfast.   [DISCONTINUED] CLENPIQ 10-3.5-12 MG-GM -GM/160ML SOLN See admin instructions.     Allergies:   Oxycodone, Celecoxib, and Latex   Social History   Socioeconomic History   Marital status: Married    Spouse name: Not on  file   Number of children: 2   Years of education: Not on file   Highest education level: Not on file  Occupational History   Not on file  Tobacco Use   Smoking status: Never   Smokeless tobacco: Never  Vaping Use   Vaping Use: Never used  Substance and Sexual Activity   Alcohol use: No   Drug use: No   Sexual activity: Yes  Other Topics Concern   Not on file  Social History Narrative   Not on file   Social Determinants of Health   Financial Resource Strain: Not on file  Food Insecurity: Not on file  Transportation Needs: Not on file  Physical Activity: Not on file  Stress: Not on file  Social Connections: Not on file    Social: FH of CAD (father) who went here  Family History: The patient's family history includes Breast cancer in her maternal aunt; Breast cancer (age of onset: 53) in her maternal aunt; Cancer in her maternal grandfather, maternal grandmother, and mother; Depression in her father; Heart disease in her father, mother, and paternal grandfather; Hyperlipidemia in her father; Irritable bowel syndrome in her paternal grandmother; Melanoma in her maternal grandmother.  ROS:   Please see the history of present illness.     All other systems reviewed and are negative.  EKGs/Labs/Other Studies Reviewed:    The following studies were reviewed today:  EKG:  EKG is  ordered today.  The ekg ordered today demonstrates  11/16/21: SR rate 87  Recent Labs: 06/22/2021: ALT 17; BUN 9; Creatinine 0.85; Hemoglobin 12.8; Platelet Count 283; Potassium 4.4; Sodium 141  Recent Lipid Panel No results found for: CHOL, TRIG, HDL, CHOLHDL, VLDL, LDLCALC, LDLDIRECT       Physical Exam:    VS:  BP 108/64   Pulse 77   Ht 5' 5.5" (1.664 m)   Wt 192 lb (87.1 kg)   LMP 04/08/2018   SpO2 97%   BMI 31.46 kg/m     Wt Readings from Last 3 Encounters:  03/13/22 192 lb (87.1 kg)  11/16/21 181 lb (82.1 kg)  06/22/21 182 lb 1.6 oz (82.6 kg)    Gen: No distress Neck: No  JVD Cardiac: No Rubs or Gallops, no Murmur, regular rate and rhythm +2 radial pulses Respiratory: Clear to auscultation bilaterally, normal effort, normal  respiratory rate GI: Soft, nontender, non-distended  MS: No  edema;  moves all extremities Integument: Skin feels warm Neuro:  At time of evaluation, alert and oriented to person/place/time/situation  Psych: Normal affect, patient feels   ASSESSMENT:    1. Mixed hyperlipidemia   2. Family history of early CAD   3. Ductal carcinoma in situ (DCIS) of breast, unspecified laterality  PLAN:    Breast Cancer s/p High radiation dose HLD FH early CAD - Shared decision making: LDL goal ( < 100) - Discussed recommendations for 150 minutes weekly exercise - stress the important of a tobacco and smoke free environment - discussed the role of 7-9 hours of sleep  - though BMI is not a perfect measurement in all patient populations; a BMI < 30 can improve cardiac outcomes; present BMI is 31  - in the setting of family history of early CAD, ordered Lp(a) and lipids in October, may start rosuvastatin 5 mg based on resuls  - Discussed recommendation of Mediterranean and Dash Diets; discussed the evidence behind vegan diets  - Discussed the important of blood sugar and blood pressure control  One year f/u    Medication Adjustments/Labs and Tests Ordered: Current medicines are reviewed at length with the patient today.  Concerns regarding medicines are outlined above.  Orders Placed This Encounter  Procedures   Lipoprotein A (LPA)   Lipid panel   No orders of the defined types were placed in this encounter.   Patient Instructions  Medication Instructions:  Your physician recommends that you continue on your current medications as directed. Please refer to the Current Medication list given to you today.  *If you need a refill on your cardiac medications before your next appointment, please call your pharmacy*   Lab Work: IN  October: FLP, lipoprotein a  If you have labs (blood work) drawn today and your tests are completely normal, you will receive your results only by: [place] (if you have MyChart) OR A paper copy in the mail If you have any lab test that is abnormal or we need to change your treatment, we will call you to review the results.   Testing/Procedures: NONE   Follow-Up: At [facility], you and your health needs are our priority.  As part of our continuing mission to provide you with exceptional heart care, we have created designated Provider Care Teams.  These Care Teams include your primary Cardiologist (physician) and Advanced Practice Providers (APPs -  Physician Assistants and Nurse Practitioners) who all work together to provide you with the care you need, when you need it.  Your next appointment:   1 year(s)  The format for your next appointment:   In Person  Provider:   Werner Lean, MD    Important Information About Sugar         Signed, Werner Lean, MD  03/13/2022 5:26 PM    [place]

## 2022-03-13 ENCOUNTER — Ambulatory Visit: Payer: BC Managed Care – PPO | Admitting: Internal Medicine

## 2022-03-13 ENCOUNTER — Encounter: Payer: Self-pay | Admitting: Internal Medicine

## 2022-03-13 VITALS — BP 108/64 | HR 77 | Ht 65.5 in | Wt 192.0 lb

## 2022-03-13 DIAGNOSIS — D0510 Intraductal carcinoma in situ of unspecified breast: Secondary | ICD-10-CM

## 2022-03-13 DIAGNOSIS — E782 Mixed hyperlipidemia: Secondary | ICD-10-CM | POA: Diagnosis not present

## 2022-03-13 DIAGNOSIS — Z8249 Family history of ischemic heart disease and other diseases of the circulatory system: Secondary | ICD-10-CM | POA: Diagnosis not present

## 2022-03-13 NOTE — Patient Instructions (Signed)
Medication Instructions:  Your physician recommends that you continue on your current medications as directed. Please refer to the Current Medication list given to you today.  *If you need a refill on your cardiac medications before your next appointment, please call your pharmacy*   Lab Work: IN October: FLP, lipoprotein a  If you have labs (blood work) drawn today and your tests are completely normal, you will receive your results only by: [place] (if you have MyChart) OR A paper copy in the mail If you have any lab test that is abnormal or we need to change your treatment, we will call you to review the results.   Testing/Procedures: NONE   Follow-Up: At [facility], you and your health needs are our priority.  As part of our continuing mission to provide you with exceptional heart care, we have created designated Provider Care Teams.  These Care Teams include your primary Cardiologist (physician) and Advanced Practice Providers (APPs -  Physician Assistants and Nurse Practitioners) who all work together to provide you with the care you need, when you need it.  Your next appointment:   1 year(s)  The format for your next appointment:   In Person  Provider:   Werner Lean, MD    Important Information About Sugar

## 2022-05-24 ENCOUNTER — Telehealth: Payer: Self-pay

## 2022-05-24 NOTE — Telephone Encounter (Signed)
Called pt reviewed MD comments and recommendations.  Pt has started a weight management program, joined a gym and is working on improving diet.  Would like to try these measures before starting rosuvastatin.  Advised pt to keep 07/12/22 lab appointment MD will review results and determine if her plan in place is decreasing LDL.  Is willing to revisit starting rosuvastatin after 07/12/22 labs. No further questions or concerns voiced at this time.

## 2022-05-24 NOTE — Telephone Encounter (Signed)
-----   Message from Werner Lean, MD sent at 05/18/2022 12:31 PM EDT ----- Regarding: LDL still elevated, no CAC, they did not draw the lipoprotein (a) we discussed We can offer her rosuvastatin 5 mg; if she would prefer to wait, I will draw that lab when I see her in October

## 2022-06-06 LAB — EXTERNAL GENERIC LAB PROCEDURE: COLOGUARD: NEGATIVE

## 2022-06-06 LAB — COLOGUARD: COLOGUARD: NEGATIVE

## 2022-07-10 ENCOUNTER — Other Ambulatory Visit: Payer: Self-pay | Admitting: *Deleted

## 2022-07-10 DIAGNOSIS — C50211 Malignant neoplasm of upper-inner quadrant of right female breast: Secondary | ICD-10-CM

## 2022-07-11 ENCOUNTER — Inpatient Hospital Stay (HOSPITAL_BASED_OUTPATIENT_CLINIC_OR_DEPARTMENT_OTHER): Payer: BC Managed Care – PPO | Admitting: Hematology and Oncology

## 2022-07-11 ENCOUNTER — Other Ambulatory Visit: Payer: Self-pay

## 2022-07-11 ENCOUNTER — Encounter: Payer: Self-pay | Admitting: Hematology and Oncology

## 2022-07-11 ENCOUNTER — Inpatient Hospital Stay: Payer: BC Managed Care – PPO | Attending: Hematology and Oncology

## 2022-07-11 VITALS — BP 121/64 | HR 86 | Temp 97.9°F | Resp 16 | Ht 65.0 in | Wt 186.9 lb

## 2022-07-11 DIAGNOSIS — Z79810 Long term (current) use of selective estrogen receptor modulators (SERMs): Secondary | ICD-10-CM | POA: Diagnosis not present

## 2022-07-11 DIAGNOSIS — Z79899 Other long term (current) drug therapy: Secondary | ICD-10-CM | POA: Insufficient documentation

## 2022-07-11 DIAGNOSIS — Z923 Personal history of irradiation: Secondary | ICD-10-CM | POA: Insufficient documentation

## 2022-07-11 DIAGNOSIS — E039 Hypothyroidism, unspecified: Secondary | ICD-10-CM | POA: Insufficient documentation

## 2022-07-11 DIAGNOSIS — Z170 Estrogen receptor positive status [ER+]: Secondary | ICD-10-CM

## 2022-07-11 DIAGNOSIS — Z803 Family history of malignant neoplasm of breast: Secondary | ICD-10-CM | POA: Diagnosis not present

## 2022-07-11 DIAGNOSIS — Z8042 Family history of malignant neoplasm of prostate: Secondary | ICD-10-CM | POA: Insufficient documentation

## 2022-07-11 DIAGNOSIS — C50211 Malignant neoplasm of upper-inner quadrant of right female breast: Secondary | ICD-10-CM | POA: Diagnosis not present

## 2022-07-11 DIAGNOSIS — C50212 Malignant neoplasm of upper-inner quadrant of left female breast: Secondary | ICD-10-CM | POA: Diagnosis not present

## 2022-07-11 DIAGNOSIS — Z791 Long term (current) use of non-steroidal anti-inflammatories (NSAID): Secondary | ICD-10-CM | POA: Diagnosis not present

## 2022-07-11 DIAGNOSIS — Z79890 Hormone replacement therapy (postmenopausal): Secondary | ICD-10-CM | POA: Insufficient documentation

## 2022-07-11 DIAGNOSIS — R232 Flushing: Secondary | ICD-10-CM | POA: Insufficient documentation

## 2022-07-11 LAB — CMP (CANCER CENTER ONLY)
ALT: 36 U/L (ref 0–44)
AST: 26 U/L (ref 15–41)
Albumin: 4.1 g/dL (ref 3.5–5.0)
Alkaline Phosphatase: 42 U/L (ref 38–126)
Anion gap: 2 — ABNORMAL LOW (ref 5–15)
BUN: 16 mg/dL (ref 6–20)
CO2: 31 mmol/L (ref 22–32)
Calcium: 9.1 mg/dL (ref 8.9–10.3)
Chloride: 104 mmol/L (ref 98–111)
Creatinine: 0.86 mg/dL (ref 0.44–1.00)
GFR, Estimated: >60 mL/min (ref >60)
Glucose, Bld: 91 mg/dL (ref 70–99)
Potassium: 4.3 mmol/L (ref 3.5–5.1)
Sodium: 137 mmol/L (ref 135–145)
Total Bilirubin: 0.3 mg/dL (ref 0.3–1.2)
Total Protein: 6.4 g/dL — ABNORMAL LOW (ref 6.5–8.1)

## 2022-07-11 LAB — CBC WITH DIFFERENTIAL (CANCER CENTER ONLY)
Abs Immature Granulocytes: 0.01 10*3/uL (ref 0.00–0.07)
Basophils Absolute: 0.1 10*3/uL (ref 0.0–0.1)
Basophils Relative: 1 %
Eosinophils Absolute: 0.6 10*3/uL — ABNORMAL HIGH (ref 0.0–0.5)
Eosinophils Relative: 10 %
HCT: 37.8 % (ref 36.0–46.0)
Hemoglobin: 12.9 g/dL (ref 12.0–15.0)
Immature Granulocytes: 0 %
Lymphocytes Relative: 28 %
Lymphs Abs: 1.6 10*3/uL (ref 0.7–4.0)
MCH: 29.1 pg (ref 26.0–34.0)
MCHC: 34.1 g/dL (ref 30.0–36.0)
MCV: 85.3 fL (ref 80.0–100.0)
Monocytes Absolute: 0.6 10*3/uL (ref 0.1–1.0)
Monocytes Relative: 10 %
Neutro Abs: 2.9 10*3/uL (ref 1.7–7.7)
Neutrophils Relative %: 51 %
Platelet Count: 298 10*3/uL (ref 150–400)
RBC: 4.43 MIL/uL (ref 3.87–5.11)
RDW: 12.1 % (ref 11.5–15.5)
WBC Count: 5.7 10*3/uL (ref 4.0–10.5)
nRBC: 0 % (ref 0.0–0.2)

## 2022-07-11 NOTE — Progress Notes (Signed)
[place]  [phone]) [phone] [phone]     ID: Marie Castro DOB: [birth year]  MR#: [id number]  GDJ#:[id number]  Patient Care Team: [name], PA as PCP - General (Nurse Practitioner) Werner Lean, MD as PCP - Cardiology (Cardiology) Magrinat, [name], MD (Inactive) as Consulting Physician (Oncology) [name], MD as Consulting Physician (General Surgery) [name], MD as Consulting Physician (Radiation Oncology) [name], PA-C as Physician Assistant (Obstetrics and Gynecology) Register, Luetta Nutting, PA-C as Physician Assistant (Dermatology) Tiajuana Amass, MD as Referring Physician (Allergy and Immunology) OTHER MD:  CHIEF COMPLAINT: Invasive lobular breast cancer, estrogen receptor positive  CURRENT TREATMENT: tamoxifen  INTERVAL HISTORY:  Marie Castro returns today for follow-up of her estrogen receptor positive breast cancer.  She continues on tamoxifen for antiestrogen therapy.   She reports hot flashes, otherwise has been tolerating tamoxifen very well.  Venlafaxine has been very helpful. Last mammogram Sep 2023, no mammographic evidence of malignancy. Rest of the pertinent 10 point ROS reviewed and negative.  REVIEW OF SYSTEMS: Detailed review of systems today was otherwise noncontributory   COVID 19 VACCINATION STATUS: Pfizer x2 and 1 booster.   HISTORY OF CURRENT ILLNESS: From the original intake note:  Marie Castro had bilateral screening mammography at [facility] 07/06/2017, showing a new 1.5 cm oval mass in the right breast upper inner quadrant, a possible development asymmetry in the left breast upper outer quadrant, and a 0.3 cm area of calcifications in the left breast upper outer quadrant. On 07/11/2017 the patient underwent left diagnostic mammography with tomography and bilateral breast ultrasonography. The left breast mammography showed the breast density to be category C. In the upper-outer quadrant of the left breast there was  a 0.8 cm oval mass which by ultrasound was a benign complicated cyst. There was also a 0.3 cm area of grouped calcifications in the left breast upper outer quadrant.  In the right breast, ultrasonography showed an irregular mass in the upper inner quadrant adjacent to a benign 1.5 cm simple cyst.  On 07/12/2017 she underwent bilateral biopsies. On the right there was an invasive lobular carcinoma, grade 1 or 2, estrogen receptor 80% positive with moderate staining intensity, progesterone receptor 95% positive with strong staining intensity, with an MIB-1 of 1%, and no HER-2 amplification, the signals ratio being 1.12 and the number per cell 1.85. ([id number])  On the left the biopsy showed atypical lobular hyperplasia.  Both axillae were sonographically benign  The patient's subsequent history is as detailed below.   PAST MEDICAL HISTORY: Past Medical History:  Diagnosis Date   Anxiety    Cancer ([place])    breast   Family history of breast cancer    Family history of heart disease    Family history of prostate cancer    History of radiation therapy 10/04/17-11/22/17   left breast 50.4 Gy in 28 fractions, left breast boost 10 Gy in 5 fractions, right breast 50.4 Gy in 28 fractions, right breast boost 12 Gy in 6 fractions   Hypothyroidism    Idiopathic urticaria    Malignant neoplasm of upper-outer quadrant of left female breast ([place])    Migraine    Overweight    Thyroid disease     PAST SURGICAL HISTORY: Past Surgical History:  Procedure Laterality Date   BREAST BIOPSY Right 07/12/2017   BREAST LUMPECTOMY WITH RADIOACTIVE SEED AND SENTINEL LYMPH NODE BIOPSY Right 08/16/2017   Procedure: RIGHT BREAST LUMPECTOMY WITH RADIOACTIVE SEED AND RIGHT SENTINEL LYMPH NODE BIOPSY;  Surgeon:  [name], MD;  Location: [place];  Service: General;  Laterality: Right;   BREAST LUMPECTOMY WITH RADIOACTIVE SEED LOCALIZATION Left 08/16/2017   Procedure: LEFT BREAST PARTIAL MASTECTOMY WITH BRACKETED  RADIOACTIVE SEED LOCALIZATION;  Surgeon: [name], MD;  Location: [place];  Service: General;  Laterality: Left;   DILATION AND CURETTAGE OF UTERUS     KNEE SURGERY     MULTIPLE TOOTH EXTRACTIONS     RE-EXCISION OF BREAST LUMPECTOMY Left 08/27/2017   Procedure: LEFT RE-EXCISION OF BREAST LUMPECTOMY ERAS PATHWAY;  Surgeon: [name], MD;  Location: [place];  Service: General;  Laterality: Left;   WISDOM TOOTH EXTRACTION      FAMILY HISTORY Family History  Problem Relation Age of Onset   Cancer Mother        SKIN AND BREAST   Heart disease Mother    Heart disease Father    Hyperlipidemia Father    Depression Father    Cancer Maternal Grandmother        BREAST   Melanoma Maternal Grandmother        toe   Cancer Maternal Grandfather        PROSTATE   Irritable bowel syndrome Paternal Grandmother    Heart disease Paternal Grandfather    Breast cancer Maternal Aunt 72   Breast cancer Maternal Aunt        dx in her 33s  The patient's father is 35 year old and her mother 28 year old as of October 2018. The patient has one sister, no brothers. The patient's mother and mother's mother both had breast cancer in their 4s. A maternal aunt had breast cancer at age 30, and another at age 64. The patient's mother has had genetics testing, which was negative. Please also consulted the detailed genetics pedigree in [place]:  Patient's last menstrual period was 04/08/2018. Menarche age 71, first live birth age 51, she is still premenopausal, with regular periods lasting approximately 5 days, of which 3 are heavy. She used oral contraceptives approximately 2 years with no complications and also had a subcutaneous depot contraceptive briefly.  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 Her husband Hospital doctor ("Marie Castro") is a Electronics engineer for [employer]. Their son, Marie Castro, is 34 as of September 2022 and is going to be going to college studying business.  And daughter Marie Castro is 37 as of September 2022 the patient attends the [employer] DIRECTIVES: In the absence of any documentation to the contrary, the patient's spouse is their HCPOA.   HEALTH MAINTENANCE: Social History   Tobacco Use   Smoking status: Never   Smokeless tobacco: Never  Vaping Use   Vaping Use: Never used  Substance Use Topics   Alcohol use: No   Drug use: No     Colonoscopy: Never  PAP:  Bone density: Never   Allergies  Allergen Reactions   Oxycodone Other (See Comments)    Pt states could not sleep at all, was up 24 hrs   Celecoxib     Other reaction(s): GI Intolerance, GI Upset (intolerance)   Latex Rash    Pt states rubber bands from her braces causes ulcers in her mouth    Current Outpatient Medications  Medication Sig Dispense Refill   [employer] D3 25 MCG (1000 UT) capsule TAKE 1 CAPSULE BY MOUTH EVERY DAY 120 capsule 4  eletriptan (RELPAX) 40 MG tablet Take 1 tablet (40 mg total) by mouth as needed for migraine or headache. May repeat in 2 hours if headache persists or recurs.     EPINEPHrine 0.3 mg/0.3 mL IJ SOAJ injection as needed.     Galcanezumab-gnlm 100 MG/ML SOSY Inject into the skin every 30 (thirty) days. 3.08 mL    ibuprofen (ADVIL,MOTRIN) 200 MG tablet Take 400-600 mg by mouth daily as needed for headache or moderate pain.     levocetirizine (XYZAL) 5 MG tablet Take by mouth.     levothyroxine (SYNTHROID, LEVOTHROID) 100 MCG tablet Take 100 mcg by mouth daily before breakfast.     LORazepam (ATIVAN) 0.5 MG tablet Take 0.5 mg by mouth 2 (two) times daily as needed for anxiety.     meloxicam (MOBIC) 15 MG tablet Take 15 mg by mouth daily.     omalizumab (XOLAIR) 150 MG injection Inject 150 mg into the skin every 28 (twenty-eight) days.     ondansetron (ZOFRAN-ODT) 8 MG disintegrating tablet TAKE  1 TABLET (8 MG TOTAL) BY MOUTH EVERY 8 (EIGHT) HOURS AS NEEDED FOR NAUSEA OR VOMITING. 18 tablet 3   tamoxifen (NOLVADEX) 20 MG tablet Take 1 tablet (20 mg total) by mouth daily. 90 tablet 2   venlafaxine XR (EFFEXOR-XR) 75 MG 24 hr capsule Take 1 capsule (75 mg total) by mouth daily with breakfast. 90 capsule 2   No current facility-administered medications for this visit.    OBJECTIVE: White woman in no acute distress  Vitals:   07/11/22 1433  BP: 121/64  Pulse: 86  Resp: 16  Temp: 97.9 F (36.6 C)  SpO2: 99%      Body mass index is 31.1 kg/m.   Wt Readings from Last 3 Encounters:  07/11/22 186 lb 14.4 oz (84.8 kg)  03/13/22 192 lb (87.1 kg)  11/16/21 181 lb (82.1 kg)      ECOG FS:1 - Symptomatic but completely ambulatory  General appearance: Alert, oriented and in no acute distress Breast exam: Status post bilateral lumpectomies.  No concern for masses or regional adenopathy. LAB RESULTS:  CMP     Component Value Date/Time   NA 141 06/22/2021 1504   K 4.4 06/22/2021 1504   CL 105 06/22/2021 1504   CO2 29 06/22/2021 1504   GLUCOSE 75 06/22/2021 1504   BUN 9 06/22/2021 1504   CREATININE 0.85 06/22/2021 1504   CALCIUM 9.1 06/22/2021 1504   PROT 7.0 06/22/2021 1504   ALBUMIN 3.9 06/22/2021 1504   AST 17 06/22/2021 1504   ALT 17 06/22/2021 1504   ALKPHOS 64 06/22/2021 1504   BILITOT <0.2 (L) 06/22/2021 1504   GFRNONAA >60 06/22/2021 1504   GFRAA >60 06/08/2020 1607   GFRAA >60 11/13/2017 1512    No results found for: "TOTALPROTELP", "ALBUMINELP", "A1GS", "A2GS", "BETS", "BETA2SER", "GAMS", "MSPIKE", "SPEI"  No results found for: "KPAFRELGTCHN", "LAMBDASER", "KAPLAMBRATIO"  Lab Results  Component Value Date   WBC 5.7 07/11/2022   NEUTROABS 2.9 07/11/2022   HGB 12.9 07/11/2022   HCT 37.8 07/11/2022   MCV 85.3 07/11/2022   PLT 298 07/11/2022      Chemistry      Component Value Date/Time   NA 141 06/22/2021 1504   K 4.4 06/22/2021 1504   CL 105  06/22/2021 1504   CO2 29 06/22/2021 1504   BUN 9 06/22/2021 1504   CREATININE 0.85 06/22/2021 1504      Component Value Date/Time   CALCIUM 9.1 06/22/2021 1504  ALKPHOS 64 06/22/2021 1504   AST 17 06/22/2021 1504   ALT 17 06/22/2021 1504   BILITOT <0.2 (L) 06/22/2021 1504       No results found for: "LABCA2"  No components found for: "NTIRWE315"  No results for input(s): "INR" in the last 168 hours.  No results found for: "LABCA2"  No results found for: "QMG867"  No results found for: "CAN125"  No results found for: "CAN153"  No results found for: "CA2729"  No components found for: "HGQUANT"  No results found for: "CEA1", "CEA" / No results found for: "CEA1", "CEA"   No results found for: "AFPTUMOR"  No results found for: "CHROMOGRNA"  No results found for: "HGBA", "HGBA2QUANT", "HGBFQUANT", "HGBSQUAN" (Hemoglobinopathy evaluation)   No results found for: "LDH"  No results found for: "IRON", "TIBC", "IRONPCTSAT" (Iron and TIBC)  No results found for: "FERRITIN"  Urinalysis No results found for: "COLORURINE", "APPEARANCEUR", "LABSPEC", "PHURINE", "GLUCOSEU", "HGBUR", "BILIRUBINUR", "KETONESUR", "PROTEINUR", "UROBILINOGEN", "NITRITE", "LEUKOCYTESUR"   STUDIES: No results found.   ELIGIBLE FOR AVAILABLE RESEARCH PROTOCOL: no  ASSESSMENT: 51 y.o. [place] woman status post right breast upper inner quadrant biopsy 07/11/2017 for a clinical T1c N0, stage IA invasive lobular carcinoma, E-cadherin negative, grade 1 or 2, estrogen and progesterone receptor positive, HER-2 nonamplified, with an MIB-1 of 1%  (1) biopsy of left breast upper outer quadrant calcifications 07/11/2017 showed atypical lobular hyperplasia  (a) breast MRI 07/19/2017 shows 2 additional areas of concern in the left breast, with MRI guided biopsy 07/25/2017 showing ductal carcinoma in situ x2, low-grade, estrogen and progesterone receptor strongly positive.  (2) genetics testing  07/27/2017 through the STAT Breast cancer panel offered by [facility] found no deleterious mutations in ATM, BRCA1, BRCA2, CDH1, CHEK2, PALB2, PTEN, STK11 and TP53.   Reflexed to the Multi-Gene Panel offered by [facility] finding no deleterious mutations in ALK, APC, ATM, AXIN2,BAP1,  BARD1, BLM, BMPR1A, BRCA1, BRCA2, BRIP1, CASR, CDC73, CDH1, CDK4, CDKN1B, CDKN1C, CDKN2A (p14ARF), CDKN2A (p16INK4a), CEBPA, CHEK2, CTNNA1, DICER1, DIS3L2, EGFR (c.2369C>T, p.Thr790Met variant only), EPCAM (Deletion/duplication testing only), FH, FLCN, GATA2, GPC3, GREM1 (Promoter region deletion/duplication testing only), HOXB13 (c.251G>A, p.Gly84Glu), HRAS, KIT, MAX, MEN1, MET, MITF (c.952G>A, p.Glu318Lys variant only), MLH1, MSH2, MSH3, MSH6, MUTYH, NBN, NF1, NF2, NTHL1, PALB2, PDGFRA, PHOX2B, PMS2, POLD1, POLE, POT1, PRKAR1A, PTCH1, PTEN, RAD50, RAD51C, RAD51D, RB1, RECQL4, RET, RUNX1, SDHAF2, SDHA (sequence changes only), SDHB, SDHC, SDHD, SMAD4, SMARCA4, SMARCB1, SMARCE1, STK11, SUFU, TERT, TERT, TMEM127, TP53, TSC1, TSC2, VHL, WRN and WT1.   (3) tamoxifen started neoadjuvantly 07/24/2017  (a) held July 2019 secondary to hives, resumed November 2019  [(b) Bx of hives = urticaria, treated with omalizumab]  (4) status post right lumpectomy and sentinel lymph node sampling 08/16/2017 for a pT1c pN0, stage IA invasive lobular carcinoma, grade 2, total of 3 sentinel nodes removed  (5) status post left lumpectomy 08/16/2017 for ductal carcinoma in situ, low-grade, with focally positive margins, Tis NX.  (a) additional surgery for margin clearance 08/27/2017 was successful ([id number])  (6) Oncotype DX score of 12 predicts a 10-year risk of recurrence outside the breast of 8% if the patient's only systemic therapy is tamoxifen for 5 years.  It also predicts no benefit from chemotherapy.  (7) adjuvant radiation 10/04/2018 to 11/22/2017: 50.4 Gy to the left breast with a 10 Gy boost as well as  50.4 Gy to the right breast with a  12 Gy boost  (8) to continue tamoxifen a minimum of 5 years, then consider 2 years additional aromatase  inhibitor treatment  (a) on 06/08/2020 FSH was 44.4 with estradiol 60.3 (perimenopausal).   PLAN: We have today discussed about considering 2 additional years of antiestrogen therapy like recommended by Dr. Jana Hakim. We have also discussed that 7 years of antiestrogen therapy is a reasonable recommendation especially with her bilateral breast cancer.  She is very willing to continue tamoxifen.  I am not entirely sure if she is postmenopausal to recommend antiestrogen therapy with aromatase inhibitors.  We have once again discussed about adverse effects of tamoxifen including but not limited to DVT/PE, endometrial hyperplasia and benefit on bone density.  She understands the symptoms and Castro of DVT/PE. She will return to clinic in 1 year or sooner.  Next mammogram will be due in September 2024 and this has been already scheduled for September 9. Mammogram on June 15, 2022 with no evidence of malignancy, routine mammogram recommended in 1 year.  Total time spent: 30 minutes  *Total Encounter Time as defined by the Centers for Medicare and Medicaid Services includes, in addition to the face-to-face time of a patient visit (documented in the note above) non-face-to-face time: obtaining and reviewing outside history, ordering and reviewing medications, tests or procedures, care coordination (communications with other health care professionals or caregivers) and documentation in the medical record.

## 2022-07-12 ENCOUNTER — Ambulatory Visit: Payer: BC Managed Care – PPO | Attending: Internal Medicine

## 2022-07-12 DIAGNOSIS — E782 Mixed hyperlipidemia: Secondary | ICD-10-CM

## 2022-07-12 DIAGNOSIS — Z8249 Family history of ischemic heart disease and other diseases of the circulatory system: Secondary | ICD-10-CM

## 2022-07-13 LAB — LIPID PANEL
Chol/HDL Ratio: 2.8 ratio (ref 0.0–4.4)
Cholesterol, Total: 132 mg/dL (ref 100–199)
HDL: 48 mg/dL (ref >39)
LDL Chol Calc (NIH): 69 mg/dL (ref 0–99)
Triglycerides: 74 mg/dL (ref 0–149)
VLDL Cholesterol Cal: 15 mg/dL (ref 5–40)

## 2022-07-13 LAB — LIPOPROTEIN A (LPA): Lipoprotein (a): 30.9 nmol/L (ref <75.0)

## 2022-08-29 ENCOUNTER — Other Ambulatory Visit: Payer: Self-pay | Admitting: Hematology and Oncology

## 2022-09-10 ENCOUNTER — Other Ambulatory Visit: Payer: Self-pay

## 2022-09-10 DIAGNOSIS — Z91040 Latex allergy status: Secondary | ICD-10-CM | POA: Diagnosis not present

## 2022-09-10 DIAGNOSIS — M545 Low back pain, unspecified: Secondary | ICD-10-CM | POA: Diagnosis present

## 2022-09-11 ENCOUNTER — Encounter (HOSPITAL_BASED_OUTPATIENT_CLINIC_OR_DEPARTMENT_OTHER): Payer: Self-pay | Admitting: Emergency Medicine

## 2022-09-11 ENCOUNTER — Emergency Department (HOSPITAL_BASED_OUTPATIENT_CLINIC_OR_DEPARTMENT_OTHER)
Admission: EM | Admit: 2022-09-11 | Discharge: 2022-09-11 | Disposition: A | Discharge status: Home / Self Care | Payer: BC Managed Care – PPO | Attending: Emergency Medicine | Admitting: Emergency Medicine

## 2022-09-11 DIAGNOSIS — M545 Low back pain, unspecified: Secondary | ICD-10-CM

## 2022-09-11 MED ORDER — LIDOCAINE 5 % EX PTCH: 1.0000 | MEDICATED_PATCH | CUTANEOUS | 0 refills | Status: DC

## 2022-09-11 MED ORDER — METHOCARBAMOL 500 MG PO TABS: 500.0000 mg | ORAL_TABLET | Freq: Two times a day (BID) | ORAL | 0 refills | Status: DC

## 2022-09-11 MED ORDER — METHYLPREDNISOLONE 4 MG PO TBPK: ORAL_TABLET | ORAL | 0 refills | Status: DC

## 2022-09-11 MED ORDER — KETOROLAC TROMETHAMINE 30 MG/ML IJ SOLN
30.0000 mg | Freq: Once | INTRAMUSCULAR | Status: AC
  Administered 2022-09-11: 30 mg via INTRAMUSCULAR
  Filled 2022-09-11: qty 1

## 2022-09-11 NOTE — ED Provider Notes (Signed)
[place] EMERGENCY DEPT  Provider Note  CSN: [id number] Arrival date & time: 09/10/22 2338  History Chief Complaint  Patient presents with   Back Pain    Marie Castro is a 51 y.o. female with prior history of low back pain does not take medications on a regular basis reports some tightness in her L lower back over the last few days, became significantly worse about 24 hours ago but got to the point she could not stand or walk without assistance after she tried to get up from the bed earlier tonight. She has tried taking NSAIDs and muscle relaxers during the day without much improvement. She denies any falls or injuries. No abdominal pain. No bowel or bladder problems. No fever or dysuria. Pain does not radiate down her leg. She had similar about 3 years ago and given steroids with good improvement. She was given IM toradol in triage with significant improvement.    Home Medications Prior to Admission medications   Medication Sig Start Date End Date Taking? Authorizing Provider  lidocaine (LIDODERM) 5 % Place 1 patch onto the skin daily. Remove & Discard patch within 12 hours or as directed by MD 09/11/22  Yes Truddie Hidden, MD  methocarbamol (ROBAXIN) 500 MG tablet Take 1 tablet (500 mg total) by mouth 2 (two) times daily. 09/11/22  Yes Truddie Hidden, MD  methylPREDNISolone (MEDROL DOSEPAK) 4 MG TBPK tablet Use as directed 09/11/22  Yes Truddie Hidden, MD  [employer]S D3 25 MCG (1000 UT) capsule TAKE 1 CAPSULE BY MOUTH EVERY DAY 06/11/20   Magrinat, [name], MD  eletriptan (RELPAX) 40 MG tablet Take 1 tablet (40 mg total) by mouth as needed for migraine or headache. May repeat in 2 hours if headache persists or recurs. 09/15/19   Magrinat, [name], MD  EPINEPHrine 0.3 mg/0.3 mL IJ SOAJ injection as needed. 11/02/21   [provider]  Galcanezumab-gnlm 100 MG/ML SOSY Inject into the skin every 30 (thirty) days. 06/23/21   Magrinat, [name], MD  ibuprofen  (ADVIL,MOTRIN) 200 MG tablet Take 400-600 mg by mouth daily as needed for headache or moderate pain.    [provider]  levocetirizine (XYZAL) 5 MG tablet Take by mouth.    [provider]  levothyroxine (SYNTHROID, LEVOTHROID) 100 MCG tablet Take 100 mcg by mouth daily before breakfast.    [provider]  LORazepam (ATIVAN) 0.5 MG tablet Take 0.5 mg by mouth 2 (two) times daily as needed for anxiety.    [provider]  meloxicam (MOBIC) 15 MG tablet Take 15 mg by mouth daily. 03/11/22   [provider]  omalizumab Arvid Right) 150 MG injection Inject 150 mg into the skin every 28 (twenty-eight) days.    [provider]  ondansetron (ZOFRAN-ODT) 8 MG disintegrating tablet TAKE 1 TABLET (8 MG TOTAL) BY MOUTH EVERY 8 (EIGHT) HOURS AS NEEDED FOR NAUSEA OR VOMITING. 04/15/19   Magrinat, [name], MD  tamoxifen (NOLVADEX) 20 MG tablet Take 1 tablet (20 mg total) by mouth daily. 03/03/22   Benay Pike, MD  venlafaxine XR (EFFEXOR-XR) 75 MG 24 hr capsule TAKE 1 CAPSULE BY MOUTH DAILY WITH BREAKFAST. 08/29/22   Benay Pike, MD     Allergies    Oxycodone, Celecoxib, and Latex   Review of Systems   Review of Systems Please see HPI for pertinent positives and negatives  Physical Exam BP (!) 102/59   Pulse 94   Temp 98.2 F (36.8 C)  Resp 18   LMP 04/08/2018   SpO2 97%   Physical Exam Vitals and nursing note reviewed.  Constitutional:      Appearance: Normal appearance.  HENT:     Head: Normocephalic and atraumatic.     Nose: Nose normal.     Mouth/Throat:     Mouth: Mucous membranes are moist.  Eyes:     Extraocular Movements: Extraocular movements intact.     Conjunctiva/sclera: Conjunctivae normal.  Cardiovascular:     Rate and Rhythm: Normal rate.  Pulmonary:     Effort: Pulmonary effort is normal.     Breath sounds: Normal breath sounds.  Abdominal:     General: Abdomen is flat.     Palpations: Abdomen is soft.      Tenderness: There is no abdominal tenderness.  Musculoskeletal:        General: Tenderness (L lumbar paraspinal muscles, no tenderness over SI joints or sciatic notch) present. No swelling. Normal range of motion.     Cervical back: Neck supple.  Skin:    General: Skin is warm and dry.  Neurological:     General: No focal deficit present.     Mental Status: She is alert.     Sensory: No sensory deficit.     Motor: No weakness.     Gait: Gait normal.     Deep Tendon Reflexes: Reflexes normal.  Psychiatric:        Mood and Affect: Mood normal.     ED Results / Procedures / Treatments   EKG None  Procedures Procedures  Medications Ordered in the ED Medications  ketorolac (TORADOL) 30 MG/ML injection 30 mg (30 mg Intramuscular Given 09/11/22 0031)    Initial Impression and Plan  Patient here with MSK low back pain. Does not sound like sciatica. Had good relief with Toradol. Will continue with NSAIDs at home. Rx for Robaxin and lidocaine patches if needed. She has had good improvement using steroids in the past as well, will give Rx for Medrol  ED Course       MDM Rules/Calculators/A&P Medical Decision Making Problems Addressed: Acute left-sided low back pain without sciatica: acute illness or injury  Risk Prescription drug management.    Final Clinical Impression(s) / ED Diagnoses Final diagnoses:  Acute left-sided low back pain without sciatica    Rx / DC Orders ED Discharge Orders          Ordered    methylPREDNISolone (MEDROL DOSEPAK) 4 MG TBPK tablet        09/11/22 0303    methocarbamol (ROBAXIN) 500 MG tablet  2 times daily        09/11/22 0303    lidocaine (LIDODERM) 5 %  Every 24 hours        09/11/22 0303             Truddie Hidden, MD 09/11/22 [phone]

## 2022-09-11 NOTE — ED Triage Notes (Signed)
Pt here from home with c/o left lower back pain , hx of same , tried  ibuprofen and flexeril at home without relief

## 2022-11-03 ENCOUNTER — Ambulatory Visit (HOSPITAL_BASED_OUTPATIENT_CLINIC_OR_DEPARTMENT_OTHER): Payer: BC Managed Care – PPO | Attending: Nurse Practitioner | Admitting: Physical Therapy

## 2022-11-03 ENCOUNTER — Encounter (HOSPITAL_BASED_OUTPATIENT_CLINIC_OR_DEPARTMENT_OTHER): Payer: Self-pay | Admitting: Physical Therapy

## 2022-11-03 DIAGNOSIS — M62830 Muscle spasm of back: Secondary | ICD-10-CM

## 2022-11-03 DIAGNOSIS — M8580 Other specified disorders of bone density and structure, unspecified site: Secondary | ICD-10-CM | POA: Diagnosis not present

## 2022-11-03 DIAGNOSIS — M545 Low back pain, unspecified: Secondary | ICD-10-CM | POA: Diagnosis not present

## 2022-11-03 DIAGNOSIS — M5459 Other low back pain: Secondary | ICD-10-CM

## 2022-11-03 NOTE — Therapy (Signed)
OUTPATIENT PHYSICAL THERAPY LOWER EXTREMITY EVALUATION   Patient Name: Marie Castro MRN: [id number] DOB:Dec 13, 1970, 52 y.o., female Today's Date: 11/06/2022  END OF SESSION:  PT End of Session - 11/06/22 0849     Visit Number 2    Number of Visits 8    Date for PT Re-Evaluation 12/29/22    PT Start Time 3646    PT Stop Time 1633    PT Time Calculation (min) 48 min    Activity Tolerance Patient tolerated treatment well    Behavior During Therapy [facility] for tasks assessed/performed             Past Medical History:  Diagnosis Date   Anxiety    Cancer ([place])    breast   Family history of breast cancer    Family history of heart disease    Family history of prostate cancer    History of radiation therapy 10/04/17-11/22/17   left breast 50.4 Gy in 28 fractions, left breast boost 10 Gy in 5 fractions, right breast 50.4 Gy in 28 fractions, right breast boost 12 Gy in 6 fractions   Hypothyroidism    Idiopathic urticaria    Malignant neoplasm of upper-outer quadrant of left female breast ([place])    Migraine    Overweight    Thyroid disease    Past Surgical History:  Procedure Laterality Date   BREAST BIOPSY Right 07/12/2017   BREAST LUMPECTOMY WITH RADIOACTIVE SEED AND SENTINEL LYMPH NODE BIOPSY Right 08/16/2017   Procedure: RIGHT BREAST LUMPECTOMY WITH RADIOACTIVE SEED AND RIGHT SENTINEL LYMPH NODE BIOPSY;  Surgeon: [name], MD;  Location: [place];  Service: General;  Laterality: Right;   BREAST LUMPECTOMY WITH RADIOACTIVE SEED LOCALIZATION Left 08/16/2017   Procedure: LEFT BREAST PARTIAL MASTECTOMY WITH BRACKETED RADIOACTIVE SEED LOCALIZATION;  Surgeon: [name], MD;  Location: [place];  Service: General;  Laterality: Left;   DILATION AND CURETTAGE OF UTERUS     KNEE SURGERY     MULTIPLE TOOTH EXTRACTIONS     RE-EXCISION OF BREAST LUMPECTOMY Left 08/27/2017   Procedure: LEFT RE-EXCISION OF BREAST LUMPECTOMY ERAS PATHWAY;  Surgeon: [name], MD;  Location: [place];  Service: General;  Laterality: Left;   [place] EXTRACTION     Patient Active Problem List   Diagnosis Date Noted   Family history of early CAD 03/13/2022   Mixed hyperlipidemia 03/13/2022   Ductal carcinoma in situ (DCIS) of breast 03/13/2022   S/P partial mastectomy, bilateral 12/17/2018   Breast asymmetry following reconstructive surgery 12/17/2018   Allergic reaction 05/20/2018   Genetic testing 07/31/2017   Malignant neoplasm of upper-inner quadrant of right breast in female, estrogen receptor positive ([place]) 07/24/2017   Family history of breast cancer    Family history of prostate cancer     PCP: [name] PA   REFERRING PROVIDER: Phyllis Ginger NP   REFERRING DIAG:  M54.50 (ICD-10-CM) - Low back pain, unspecified  M85.80 (ICD-10-CM) - Other specified disorders of bone density and structure, unspecified site    THERAPY DIAG:  Other low back pain  Muscle spasm of back  Rationale for Evaluation and Treatment: Rehabilitation  ONSET DATE: has had low back pain for a long time.   SUBJECTIVE:   SUBJECTIVE STATEMENT: Patient has prior history of long low back pain.  She had a significant episode on 12/3 to the point where she had a hard time standing.  The severe pain is improved. She continues to go to the gym.  She has a dull pain in the right side of her lower back that has stayed with her. She also has a history of right knee pain.  The patient had a session of informal trigger point dry needling which helped her pain significantly.  She can still feel a constant dull ache on the right side.  PERTINENT HISTORY: Anxiety, cancer of the breast, migraines, Osteopenia,  PAIN:  Are you having pain? Yes: NPRS scale: 5/10 at worst  Pain location: right lower back  Pain description: aching  Aggravating factors: standing and walking  Relieving factors: rest   PRECAUTIONS: None  WEIGHT BEARING RESTRICTIONS: No  FALLS:  Has patient fallen in last 6  months? No  LIVING ENVIRONMENT: Pain going up steps in her knee OCCUPATION: school OT   Hobbies:   PLOF: Independent  PATIENT GOALS: to have less pain   NEXT MD VISIT:  Nothing scheduled   OBJECTIVE:   DIAGNOSTIC FINDINGS:  Low back x-ray  INDICATION: Lumbar back pain  COMPARISON: None  TECHNIQUE: AP and lateral views of the thoracic spine on 3 films  FINDINGS: No evidence of acute fracture or dislocation. There is mild dextroscoliosis of the lower thoracic/visualized upper lumbar spine measuring approximately 17 degrees from the level of T10/T11 to the level of L2/L3. Vertebral body heights are preserved. Mild to moderate disc space narrowing in the midthoracic spine.   IMPRESSION: 1. No fracture or dislocation. Mild dextroscoliosis of the lower thoracic/visualized upper lumbar spine. 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   PATIENT SURVEYS:  [place] ]  COGNITION: Overall cognitive status: Within functional limits for tasks assessed     SENSATION: Pain can radiate into the buttock    MUSCLE LENGTH:  POSTURE: No Significant postural limitations  PALPATION: Tender to palpation in the right lateral low back   LOWER EXTREMITY ROM:  Passive ROM Right eval Left eval  Hip flexion WNL  WNL  Hip extension    Hip abduction WNL WNL  Hip adduction    Hip internal rotation    Hip external rotation    Knee flexion    Knee extension Mild pain  WNL  Ankle dorsiflexion    Ankle plantarflexion    Ankle inversion    Ankle eversion     (Blank rows = not tested)  LOWER EXTREMITY MMT:  MMT Right eval Left eval  Hip flexion 39.2 36.5  Hip extension    Hip abduction 41.0 40.9  Hip adduction    Hip internal rotation    Hip external rotation    Knee flexion    Knee extension 59.0 57  Ankle dorsiflexion    Ankle plantarflexion    Ankle inversion    Ankle eversion     (Blank rows = not tested)  LUMBAR ROM:   Active  A/PROM  eval   Flexion Full/ Pain free   Extension Full but pain full   Right lateral flexion   Left lateral flexion   Right rotation Painful   Left rotation No pain full    (Blank rows = not tested)       GAIT: Mild lateral movement to the left and right with gait.    TODAY'S TREATMENT:  DATE:  Access Code: [email or web address] URL: [email or web address] Date: 11/06/2022 Prepared by: Carolyne Littles  Exercises - Supine Piriformis Stretch with Foot on Ground  - 1 x daily - 7 x weekly - 3 sets - 3 reps - 20 hold - Child's Pose Stretch  - 1 x daily - 7 x weekly - 3 sets - 3 reps - 20 hold - Child's Pose with Sidebending  - 1 x daily - 7 x weekly - 3 sets - 3 reps - 20 hold - Theracane Over Shoulder  - 1 x daily - 7 x weekly - 3 sets - 10 reps - 1-2 min  hold - Standing Glute Med Mobilization with Small Ball on Wall  - 1 x daily - 7 x weekly - 2-3 min  hold   PATIENT EDUCATION:  Education details: reviewed HEP, symptom management; movements that cuase her pain.  Person educated: Patient Education method: Explanation, Demonstration, Tactile cues, Verbal cues, and Handouts Education comprehension: verbalized understanding, returned demonstration, verbal cues required, tactile cues required, and needs further education  HOME EXERCISE PROGRAM: Access Code: [email or web address] URL: [email or web address] Date: 11/06/2022 Prepared by: Carolyne Littles  Exercises - Supine Piriformis Stretch with Foot on Ground  - 1 x daily - 7 x weekly - 3 sets - 3 reps - 20 hold - Child's Pose Stretch  - 1 x daily - 7 x weekly - 3 sets - 3 reps - 20 hold - Child's Pose with Sidebending  - 1 x daily - 7 x weekly - 3 sets - 3 reps - 20 hold - Theracane Over Shoulder  - 1 x daily - 7 x weekly - 3 sets - 10 reps - 1-2 min  hold - Standing Glute Med Mobilization with Small Ball on Wall  - 1  x daily - 7 x weekly - 2-3 min  hold  ASSESSMENT:  CLINICAL IMPRESSION: Patient is a 52 year old female who presents with right-sided low back pain.  She had an insidious onset which led to back pain that caused her to have to go to the ED.  The high level of pain has subsided but she continues to have a dull ache on the right side of her back.  She has increased pain with extension and rotation to the right of the lumbar spine.  She has tenderness to palpation in her upper gluteal and lower lumbar spine.  She would benefit from skilled therapy to improve her ability to stand, walk, perform ADLs, and to exercise. OBJECTIVE IMPAIRMENTS: decreased ROM, decreased strength, increased muscle spasms, and pain.   ACTIVITY LIMITATIONS: sitting, standing, and locomotion level  PARTICIPATION LIMITATIONS: meal prep, cleaning, laundry, shopping, community activity, and occupation  PERSONAL FACTORS: 1-2 comorbidities: right knee pain; osteopenia   are also affecting patient's functional outcome.   REHAB POTENTIAL: Excellent  CLINICAL DECISION MAKING: Stable/uncomplicated  EVALUATION COMPLEXITY: Low   GOALS: Goals reviewed with patient? Yes  SHORT TERM GOALS: Target date: 11/27/2022   Patient will demonstrate full extension without increased lower back pain Baseline: Goal status: INITIAL  2.  Patient will demonstrate full right rotation without increased low back pain Baseline:  Goal status: INITIAL  3.  Patient will be independent with basic HEP for stretching and strengthening Baseline:  Goal status: INITIAL  LONG TERM GOALS: Target date: 12/18/2022    Patient will return to full gym program without increased pain Baseline:  Goal status: INITIAL  2.  Patient will demonstrate full active lumbar motion with IADLs without increased low back  pain Baseline:  Goal status: INITIAL  3.  Patient will stand and walk community distances without report of increased low back pain Baseline:   Goal status: INITIAL  PLAN:  PT FREQUENCY: 1x/week  PT DURATION: 6 weeks  PLANNED INTERVENTIONS: Therapeutic exercises, Therapeutic activity, Neuromuscular re-education, Gait training, Patient/Family education, Self Care, Joint mobilization, Stair training, Aquatic Therapy, Dry Needling, Cryotherapy, Moist heat, Taping, and Manual therapy  PLAN FOR NEXT SESSION: Consider trigger point dry needling of the lumbar spine, consider trigger point release to upper gluteal and lower lumbar spine, reviewed based core strengthening exercises.  Review exercises in the gym that she can continue to use.  Progress core exercises as tolerated   Carney Living, PT 11/06/2022, 8:49 AM

## 2022-11-06 ENCOUNTER — Encounter (HOSPITAL_BASED_OUTPATIENT_CLINIC_OR_DEPARTMENT_OTHER): Payer: Self-pay | Admitting: Physical Therapy

## 2022-11-07 ENCOUNTER — Ambulatory Visit (HOSPITAL_BASED_OUTPATIENT_CLINIC_OR_DEPARTMENT_OTHER): Payer: BC Managed Care – PPO | Admitting: Physical Therapy

## 2022-11-07 ENCOUNTER — Encounter (HOSPITAL_BASED_OUTPATIENT_CLINIC_OR_DEPARTMENT_OTHER): Payer: Self-pay | Admitting: Physical Therapy

## 2022-11-07 DIAGNOSIS — M5459 Other low back pain: Secondary | ICD-10-CM

## 2022-11-07 DIAGNOSIS — M62830 Muscle spasm of back: Secondary | ICD-10-CM

## 2022-11-07 DIAGNOSIS — M545 Low back pain, unspecified: Secondary | ICD-10-CM | POA: Diagnosis not present

## 2022-11-07 NOTE — Therapy (Signed)
OUTPATIENT PHYSICAL THERAPY LOWER EXTREMITY EVALUATION   Patient Name: Marie Castro MRN: [id number] DOB:11-20-1970, 52 y.o., female Today's Date: 11/07/2022  END OF SESSION:  PT End of Session - 11/07/22 1022     Visit Number 2    Number of Visits 8    Date for PT Re-Evaluation 12/29/22    PT Start Time 0848    PT Stop Time 0930    PT Time Calculation (min) 42 min    Activity Tolerance Patient tolerated treatment well    Behavior During Therapy [facility] for tasks assessed/performed              Past Medical History:  Diagnosis Date   Anxiety    Cancer ([place])    breast   Family history of breast cancer    Family history of heart disease    Family history of prostate cancer    History of radiation therapy 10/04/17-11/22/17   left breast 50.4 Gy in 28 fractions, left breast boost 10 Gy in 5 fractions, right breast 50.4 Gy in 28 fractions, right breast boost 12 Gy in 6 fractions   Hypothyroidism    Idiopathic urticaria    Malignant neoplasm of upper-outer quadrant of left female breast ([place])    Migraine    Overweight    Thyroid disease    Past Surgical History:  Procedure Laterality Date   BREAST BIOPSY Right 07/12/2017   BREAST LUMPECTOMY WITH RADIOACTIVE SEED AND SENTINEL LYMPH NODE BIOPSY Right 08/16/2017   Procedure: RIGHT BREAST LUMPECTOMY WITH RADIOACTIVE SEED AND RIGHT SENTINEL LYMPH NODE BIOPSY;  Surgeon: [name], MD;  Location: [place];  Service: General;  Laterality: Right;   BREAST LUMPECTOMY WITH RADIOACTIVE SEED LOCALIZATION Left 08/16/2017   Procedure: LEFT BREAST PARTIAL MASTECTOMY WITH BRACKETED RADIOACTIVE SEED LOCALIZATION;  Surgeon: [name], MD;  Location: [place];  Service: General;  Laterality: Left;   DILATION AND CURETTAGE OF UTERUS     KNEE SURGERY     MULTIPLE TOOTH EXTRACTIONS     RE-EXCISION OF BREAST LUMPECTOMY Left 08/27/2017   Procedure: LEFT RE-EXCISION OF BREAST LUMPECTOMY ERAS PATHWAY;  Surgeon: [name], MD;  Location: [place];  Service: General;  Laterality: Left;   [place] EXTRACTION     Patient Active Problem List   Diagnosis Date Noted   Family history of early CAD 03/13/2022   Mixed hyperlipidemia 03/13/2022   Ductal carcinoma in situ (DCIS) of breast 03/13/2022   S/P partial mastectomy, bilateral 12/17/2018   Breast asymmetry following reconstructive surgery 12/17/2018   Allergic reaction 05/20/2018   Genetic testing 07/31/2017   Malignant neoplasm of upper-inner quadrant of right breast in female, estrogen receptor positive ([place]) 07/24/2017   Family history of breast cancer    Family history of prostate cancer     PCP: [name] PA   REFERRING PROVIDER: Phyllis Ginger NP   REFERRING DIAG:  M54.50 (ICD-10-CM) - Low back pain, unspecified  M85.80 (ICD-10-CM) - Other specified disorders of bone density and structure, unspecified site    THERAPY DIAG:  Other low back pain  Muscle spasm of back  Rationale for Evaluation and Treatment: Rehabilitation  ONSET DATE: has had low back pain for a long time.   SUBJECTIVE:   SUBJECTIVE STATEMENT:  Pt states she feels L/S tightness on the R side today. Pt was doing some OHP in a class and felt some pain into her back. She is unsure of her form. The classes might be aggravating it.  Eval: Patient has prior history of long low back pain.  She had a significant episode on 12/3 to the point where she had a hard time standing.  The severe pain is improved. She continues to go to the gym. She has a dull pain in the right side of her lower back that has stayed with her. She also has a history of right knee pain.  The patient had a session of informal trigger point dry needling which helped her pain significantly.  She can still feel a constant dull ache on the right side.  PERTINENT HISTORY: Anxiety, cancer of the breast, migraines, Osteopenia,  PAIN:  Are you having pain? No: NPRS scale: 0/10 Pain location: right lower back  Pain  description: aching  Aggravating factors: standing and walking  Relieving factors: rest   PRECAUTIONS: None  WEIGHT BEARING RESTRICTIONS: No  FALLS:  Has patient fallen in last 6 months? No  LIVING ENVIRONMENT: Pain going up steps in her knee OCCUPATION: school OT   Hobbies:   PLOF: Independent  PATIENT GOALS: to have less pain   NEXT MD VISIT:  Nothing scheduled   OBJECTIVE:   DIAGNOSTIC FINDINGS:  Low back x-ray  INDICATION: Lumbar back pain  COMPARISON: None  TECHNIQUE: AP and lateral views of the thoracic spine on 3 films  FINDINGS: No evidence of acute fracture or dislocation. There is mild dextroscoliosis of the lower thoracic/visualized upper lumbar spine measuring approximately 17 degrees from the level of T10/T11 to the level of L2/L3. Vertebral body heights are preserved. Mild to moderate disc space narrowing in the midthoracic spine.   IMPRESSION: 1. No fracture or dislocation. Mild dextroscoliosis of the lower thoracic/visualized upper lumbar spine. 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   PATIENT SURVEYS:  [place] ]  COGNITION: Overall cognitive status: Within functional limits for tasks assessed     SENSATION: Pain can radiate into the buttock    MUSCLE LENGTH:  POSTURE: No Significant postural limitations  PALPATION: Tender to palpation in the right lateral low back   LOWER EXTREMITY ROM:  Passive ROM Right eval Left eval  Hip flexion WNL  WNL  Hip extension    Hip abduction WNL WNL  Hip adduction    Hip internal rotation    Hip external rotation    Knee flexion    Knee extension Mild pain  WNL  Ankle dorsiflexion    Ankle plantarflexion    Ankle inversion    Ankle eversion     (Blank rows = not tested)  LOWER EXTREMITY MMT:  MMT Right eval Left eval  Hip flexion 39.2 36.5  Hip extension    Hip abduction 41.0 40.9  Hip adduction    Hip internal rotation    Hip external rotation    Knee flexion     Knee extension 59.0 57  Ankle dorsiflexion    Ankle plantarflexion    Ankle inversion    Ankle eversion     (Blank rows = not tested)  LUMBAR ROM:   Active  A/PROM  eval  Flexion Full/ Pain free   Extension Full but pain full   Right lateral flexion   Left lateral flexion   Right rotation Painful   Left rotation No pain full    (Blank rows = not tested)       GAIT: Mild lateral movement to the left and right with gait.    TODAY'S TREATMENT:  DATE: 1/30  Trigger Point Dry-Needling  Treatment instructions: Expect mild to moderate muscle soreness. S/S of pneumothorax if dry needled over a lung field, and to seek immediate medical attention should they occur. Patient verbalized understanding of these instructions and education.   Patient Consent Given: Yes Education (verbally/handout)provided: Yes Muscles treated: R L3-4 paraspinals/multifidi Electrical stimulation performed: no Parameters:   n/a Treatment response/outcome: large twitch response elicited at L4, improve soft tissue extensibility  STM R lumbar paraspinals and QL  Seated figure 4 with QL stretch 30s 3x bilat Wall angel 2x10 Seated T/S extension with towel roll 5s 10x Edu regarding neutral spine, abdominal bracing, reducing of exaggerated lumbar extension with OH lifting and squatting, influences of shoulder and T/S mobility on lumbar extension; modifying of class exercises to reduce pain     Previous:  Exercises - Supine Piriformis Stretch with Foot on Ground  - 1 x daily - 7 x weekly - 3 sets - 3 reps - 20 hold - Child's Pose Stretch  - 1 x daily - 7 x weekly - 3 sets - 3 reps - 20 hold - Child's Pose with Sidebending  - 1 x daily - 7 x weekly - 3 sets - 3 reps - 20 hold - Theracane Over Shoulder  - 1 x daily - 7 x weekly - 3 sets - 10 reps - 1-2 min  hold - Standing Glute Med  Mobilization with Small Ball on Wall  - 1 x daily - 7 x weekly - 2-3 min  hold   PATIENT EDUCATION:  Education details: form and technique with lifting to avoid LBP, anatomy, exercise progression, acceptable levels of pain,  envelope of function, HEP, POC   Person educated: Patient Education method: Explanation, Demonstration, Tactile cues, Verbal cues, and Handouts Education comprehension: verbalized understanding, returned demonstration, verbal cues required, tactile cues required, and needs further education  HOME EXERCISE PROGRAM: Access Code: [email or web address] URL: [email or web address] Date: 11/06/2022 Prepared by: Carolyne Littles  Exercises - Supine Piriformis Stretch with Foot on Ground  - 1 x daily - 7 x weekly - 3 sets - 3 reps - 20 hold - Child's Pose Stretch  - 1 x daily - 7 x weekly - 3 sets - 3 reps - 20 hold - Child's Pose with Sidebending  - 1 x daily - 7 x weekly - 3 sets - 3 reps - 20 hold - Theracane Over Shoulder  - 1 x daily - 7 x weekly - 3 sets - 10 reps - 1-2 min  hold - Standing Glute Med Mobilization with Small Ball on Wall  - 1 x daily - 7 x weekly - 2-3 min  hold  ASSESSMENT:  CLINICAL IMPRESSION: Pt presents to session with complaint of R sided L/S tightness and discomfort with improvement in sensation as well as soft tissue extensibility following manual and TPDN. Pt then able to follow up with stretching and mobility based exercise without irritation. Given pt's OH exercise and current technique with squatting, pt is likely going into lumbar extension in order to maintain upright position which is causing overuse of the lumbar paraspinals. Pt given thorough edu about neutral spine position with squatting/ deadlifting and performing self mobilization and exercise modifications in order to reduce irritation to low back. Plan to continue TPDN for pain management and plan to check/modify exercise technique for reduction in pain. Pt likely to benefit from  progressively loading abdominal bracing and trunk stability exercise.  She would benefit from skilled therapy to improve  her ability to stand, walk, perform ADLs, and to exercise.  OBJECTIVE IMPAIRMENTS: decreased ROM, decreased strength, increased muscle spasms, and pain.   ACTIVITY LIMITATIONS: sitting, standing, and locomotion level  PARTICIPATION LIMITATIONS: meal prep, cleaning, laundry, shopping, community activity, and occupation  PERSONAL FACTORS: 1-2 comorbidities: right knee pain; osteopenia   are also affecting patient's functional outcome.   REHAB POTENTIAL: Excellent  CLINICAL DECISION MAKING: Stable/uncomplicated  EVALUATION COMPLEXITY: Low   GOALS: Goals reviewed with patient? Yes  SHORT TERM GOALS: Target date: 11/27/2022   Patient will demonstrate full extension without increased lower back pain Baseline: Goal status: INITIAL  2.  Patient will demonstrate full right rotation without increased low back pain Baseline:  Goal status: INITIAL  3.  Patient will be independent with basic HEP for stretching and strengthening Baseline:  Goal status: INITIAL  LONG TERM GOALS: Target date: 12/18/2022    Patient will return to full gym program without increased pain Baseline:  Goal status: INITIAL  2.  Patient will demonstrate full active lumbar motion with IADLs without increased low back pain Baseline:  Goal status: INITIAL  3.  Patient will stand and walk community distances without report of increased low back pain Baseline:  Goal status: INITIAL  PLAN:  PT FREQUENCY: 1x/week  PT DURATION: 6 weeks  PLANNED INTERVENTIONS: Therapeutic exercises, Therapeutic activity, Neuromuscular re-education, Gait training, Patient/Family education, Self Care, Joint mobilization, Stair training, Aquatic Therapy, Dry Needling, Cryotherapy, Moist heat, Taping, and Manual therapy  PLAN FOR NEXT SESSION: Consider trigger point dry needling of the lumbar spine, consider  trigger point release to upper gluteal and lower lumbar spine, reviewed based core strengthening exercises.  Review exercises in the gym that she can continue to use.  Progress core exercises as tolerated   Daleen Bo, PT 11/07/2022, 10:28 AM

## 2022-11-13 ENCOUNTER — Ambulatory Visit (HOSPITAL_BASED_OUTPATIENT_CLINIC_OR_DEPARTMENT_OTHER): Payer: BC Managed Care – PPO | Admitting: Physical Therapy

## 2022-11-22 ENCOUNTER — Encounter (HOSPITAL_BASED_OUTPATIENT_CLINIC_OR_DEPARTMENT_OTHER): Payer: Self-pay | Admitting: Physical Therapy

## 2022-11-22 ENCOUNTER — Ambulatory Visit (HOSPITAL_BASED_OUTPATIENT_CLINIC_OR_DEPARTMENT_OTHER): Payer: BC Managed Care – PPO | Attending: Nurse Practitioner | Admitting: Physical Therapy

## 2022-11-22 DIAGNOSIS — M5459 Other low back pain: Secondary | ICD-10-CM | POA: Diagnosis present

## 2022-11-22 DIAGNOSIS — M62830 Muscle spasm of back: Secondary | ICD-10-CM | POA: Insufficient documentation

## 2022-11-22 NOTE — Therapy (Signed)
OUTPATIENT PHYSICAL THERAPY LOWER EXTREMITY EVALUATION   Patient Name: Marie Castro MRN: [id number] DOB:Feb 15, 1971, 52 y.o., female Today's Date: 11/22/2022  END OF SESSION:     Past Medical History:  Diagnosis Date   Anxiety    Cancer ([place])    breast   Family history of breast cancer    Family history of heart disease    Family history of prostate cancer    History of radiation therapy 10/04/17-11/22/17   left breast 50.4 Gy in 28 fractions, left breast boost 10 Gy in 5 fractions, right breast 50.4 Gy in 28 fractions, right breast boost 12 Gy in 6 fractions   Hypothyroidism    Idiopathic urticaria    Malignant neoplasm of upper-outer quadrant of left female breast ([place])    Migraine    Overweight    Thyroid disease    Past Surgical History:  Procedure Laterality Date   BREAST BIOPSY Right 07/12/2017   BREAST LUMPECTOMY WITH RADIOACTIVE SEED AND SENTINEL LYMPH NODE BIOPSY Right 08/16/2017   Procedure: RIGHT BREAST LUMPECTOMY WITH RADIOACTIVE SEED AND RIGHT SENTINEL LYMPH NODE BIOPSY;  Surgeon: [name], MD;  Location: [place];  Service: General;  Laterality: Right;   BREAST LUMPECTOMY WITH RADIOACTIVE SEED LOCALIZATION Left 08/16/2017   Procedure: LEFT BREAST PARTIAL MASTECTOMY WITH BRACKETED RADIOACTIVE SEED LOCALIZATION;  Surgeon: [name], MD;  Location: [place];  Service: General;  Laterality: Left;   DILATION AND CURETTAGE OF UTERUS     KNEE SURGERY     MULTIPLE TOOTH EXTRACTIONS     RE-EXCISION OF BREAST LUMPECTOMY Left 08/27/2017   Procedure: LEFT RE-EXCISION OF BREAST LUMPECTOMY ERAS PATHWAY;  Surgeon: [name], MD;  Location: [place];  Service: General;  Laterality: Left;   [place] EXTRACTION     Patient Active Problem List   Diagnosis Date Noted   Family history of early CAD 03/13/2022   Mixed hyperlipidemia 03/13/2022   Ductal carcinoma in situ (DCIS) of breast 03/13/2022   S/P partial mastectomy, bilateral 12/17/2018   Breast  asymmetry following reconstructive surgery 12/17/2018   Allergic reaction 05/20/2018   Genetic testing 07/31/2017   Malignant neoplasm of upper-inner quadrant of right breast in female, estrogen receptor positive ([place]) 07/24/2017   Family history of breast cancer    Family history of prostate cancer     PCP: [name] PA   REFERRING PROVIDER: Phyllis Ginger NP   REFERRING DIAG:  M54.50 (ICD-10-CM) - Low back pain, unspecified  M85.80 (ICD-10-CM) - Other specified disorders of bone density and structure, unspecified site    THERAPY DIAG:  No diagnosis found.  Rationale for Evaluation and Treatment: Rehabilitation  ONSET DATE: has had low back pain for a long time.   SUBJECTIVE:   SUBJECTIVE STATEMENT:  The patient reports she is trending in the right direction. She had some pain last nght when doing the nu-step. She also had some pain standing and baking. She is otherwise doing well.    Eval: Patient has prior history of long low back pain.  She had a significant episode on 12/3 to the point where she had a hard time standing.  The severe pain is improved. She continues to go to the gym. She has a dull pain in the right side of her lower back that has stayed with her. She also has a history of right knee pain.  The patient had a session of informal trigger point dry needling which helped her pain significantly.  She can still  feel a constant dull ache on the right side.  PERTINENT HISTORY: Anxiety, cancer of the breast, migraines, Osteopenia,  PAIN:  Are you having pain? No: NPRS scale: 0/10 Pain location: right lower back  Pain description: aching  Aggravating factors: standing and walking  Relieving factors: rest   PRECAUTIONS: None  WEIGHT BEARING RESTRICTIONS: No  FALLS:  Has patient fallen in last 6 months? No  LIVING ENVIRONMENT: Pain going up steps in her knee OCCUPATION: school OT   Hobbies:   PLOF: Independent  PATIENT GOALS: to have less pain    NEXT MD VISIT:  Nothing scheduled   OBJECTIVE:   DIAGNOSTIC FINDINGS:  Low back x-ray  INDICATION: Lumbar back pain  COMPARISON: None  TECHNIQUE: AP and lateral views of the thoracic spine on 3 films  FINDINGS: No evidence of acute fracture or dislocation. There is mild dextroscoliosis of the lower thoracic/visualized upper lumbar spine measuring approximately 17 degrees from the level of T10/T11 to the level of L2/L3. Vertebral body heights are preserved. Mild to moderate disc space narrowing in the midthoracic spine.   IMPRESSION: 1. No fracture or dislocation. Mild dextroscoliosis of the lower thoracic/visualized upper lumbar spine. 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   PATIENT SURVEYS:  Fa[place] ]  COGNITION: Overall cognitive status: Within functional limits for tasks assessed     SENSATION: Pain can radiate into the buttock    MUSCLE LENGTH:  POSTURE: No Significant postural limitations  PALPATION: Tender to palpation in the right lateral low back   LOWER EXTREMITY ROM:  Passive ROM Right eval Left eval  Hip flexion WNL  WNL  Hip extension    Hip abduction WNL WNL  Hip adduction    Hip internal rotation    Hip external rotation    Knee flexion    Knee extension Mild pain  WNL  Ankle dorsiflexion    Ankle plantarflexion    Ankle inversion    Ankle eversion     (Blank rows = not tested)  LOWER EXTREMITY MMT:  MMT Right eval Left eval  Hip flexion 39.2 36.5  Hip extension    Hip abduction 41.0 40.9  Hip adduction    Hip internal rotation    Hip external rotation    Knee flexion    Knee extension 59.0 57  Ankle dorsiflexion    Ankle plantarflexion    Ankle inversion    Ankle eversion     (Blank rows = not tested)  LUMBAR ROM:   Active  A/PROM  eval  Flexion Full/ Pain free   Extension Full but pain full   Right lateral flexion   Left lateral flexion   Right rotation Painful   Left rotation No pain full     (Blank rows = not tested)       GAIT: Mild lateral movement to the left and right with gait.    TODAY'S TREATMENT:  DATE:  2/14 Trigger Point Dry-Needling  Treatment instructions: Expect mild to moderate muscle soreness. S/S of pneumothorax if dry needled over a lung field, and to seek immediate medical attention should they occur. Patient verbalized understanding of these instructions and education.   Patient Consent Given: Yes Education (verbally/handout)provided: Yes Muscles treated: R L3-4 paraspinals/multifidi; right gluteal 3x using a .30x50 neefle  Electrical stimulation performed: no Parameters:   n/a Treatment response/outcome: great twitch in gluteal   Manual: trigger point release to gluteal   Row cable  215 10 lbs  with review of breathing and posture  Cable extension 2x15 10 lbs   Nu-step L5 5 min   Reviewed concepts for shoulder strengthening and concepts for core activation    1/30  Trigger Point Dry-Needling  Treatment instructions: Expect mild to moderate muscle soreness. S/S of pneumothorax if dry needled over a lung field, and to seek immediate medical attention should they occur. Patient verbalized understanding of these instructions and education.   Patient Consent Given: Yes Education (verbally/handout)provided: Yes Muscles treated: R L3-4 paraspinals/multifidi Electrical stimulation performed: no Parameters:   n/a Treatment response/outcome: large twitch response elicited at L4, improve soft tissue extensibility  STM R lumbar paraspinals and QL  Seated figure 4 with QL stretch 30s 3x bilat Wall angel 2x10 Seated T/S extension with towel roll 5s 10x Edu regarding neutral spine, abdominal bracing, reducing of exaggerated lumbar extension with OH lifting and squatting, influences of shoulder and T/S mobility on lumbar  extension; modifying of class exercises to reduce pain     Previous:  Exercises - Supine Piriformis Stretch with Foot on Ground  - 1 x daily - 7 x weekly - 3 sets - 3 reps - 20 hold - Child's Pose Stretch  - 1 x daily - 7 x weekly - 3 sets - 3 reps - 20 hold - Child's Pose with Sidebending  - 1 x daily - 7 x weekly - 3 sets - 3 reps - 20 hold - Theracane Over Shoulder  - 1 x daily - 7 x weekly - 3 sets - 10 reps - 1-2 min  hold - Standing Glute Med Mobilization with Small Ball on Wall  - 1 x daily - 7 x weekly - 2-3 min  hold   PATIENT EDUCATION:  Education details: form and technique with lifting to avoid LBP, anatomy, exercise progression, acceptable levels of pain,  envelope of function, HEP, POC   Person educated: Patient Education method: Explanation, Demonstration, Tactile cues, Verbal cues, and Handouts Education comprehension: verbalized understanding, returned demonstration, verbal cues required, tactile cues required, and needs further education  HOME EXERCISE PROGRAM: Access Code: [email or web address] URL: [email or web address] Date: 11/06/2022 Prepared by: Carolyne Littles  Exercises - Supine Piriformis Stretch with Foot on Ground  - 1 x daily - 7 x weekly - 3 sets - 3 reps - 20 hold - Child's Pose Stretch  - 1 x daily - 7 x weekly - 3 sets - 3 reps - 20 hold - Child's Pose with Sidebending  - 1 x daily - 7 x weekly - 3 sets - 3 reps - 20 hold - Theracane Over Shoulder  - 1 x daily - 7 x weekly - 3 sets - 10 reps - 1-2 min  hold - Standing Glute Med Mobilization with Small Ball on Wall  - 1 x daily - 7 x weekly - 2-3 min  hold  ASSESSMENT:  CLINICAL IMPRESSION: The patient is making good progress. She had trigger points  in her gluteal today. She had a good twitch with needling and reported improved TTP after needling and soft tissue work. We also reviewed gym exercises and breathing/. She tolerated well. We will continue to work on manual therapy as well as getting her  back into a gym program. She was advised to use her tennis ball if she gets sore tomorrow in the gluteal.  OBJECTIVE IMPAIRMENTS: decreased ROM, decreased strength, increased muscle spasms, and pain.   ACTIVITY LIMITATIONS: sitting, standing, and locomotion level  PARTICIPATION LIMITATIONS: meal prep, cleaning, laundry, shopping, community activity, and occupation  PERSONAL FACTORS: 1-2 comorbidities: right knee pain; osteopenia   are also affecting patient's functional outcome.   REHAB POTENTIAL: Excellent  CLINICAL DECISION MAKING: Stable/uncomplicated  EVALUATION COMPLEXITY: Low   GOALS: Goals reviewed with patient? Yes  SHORT TERM GOALS: Target date: 11/27/2022   Patient will demonstrate full extension without increased lower back pain Baseline: Goal status: INITIAL  2.  Patient will demonstrate full right rotation without increased low back pain Baseline:  Goal status: INITIAL  3.  Patient will be independent with basic HEP for stretching and strengthening Baseline:  Goal status: INITIAL  LONG TERM GOALS: Target date: 12/18/2022    Patient will return to full gym program without increased pain Baseline:  Goal status: INITIAL  2.  Patient will demonstrate full active lumbar motion with IADLs without increased low back pain Baseline:  Goal status: INITIAL  3.  Patient will stand and walk community distances without report of increased low back pain Baseline:  Goal status: INITIAL  PLAN:  PT FREQUENCY: 1x/week  PT DURATION: 6 weeks  PLANNED INTERVENTIONS: Therapeutic exercises, Therapeutic activity, Neuromuscular re-education, Gait training, Patient/Family education, Self Care, Joint mobilization, Stair training, Aquatic Therapy, Dry Needling, Cryotherapy, Moist heat, Taping, and Manual therapy  PLAN FOR NEXT SESSION: Consider trigger point dry needling of the lumbar spine, consider trigger point release to upper gluteal and lower lumbar spine, reviewed based  core strengthening exercises.  Review exercises in the gym that she can continue to use.  Progress core exercises as tolerated   Carney Living, PT 11/22/2022, 9:47 AM

## 2022-11-29 ENCOUNTER — Encounter (HOSPITAL_BASED_OUTPATIENT_CLINIC_OR_DEPARTMENT_OTHER): Payer: Self-pay | Admitting: Physical Therapy

## 2022-11-29 ENCOUNTER — Ambulatory Visit (HOSPITAL_BASED_OUTPATIENT_CLINIC_OR_DEPARTMENT_OTHER): Payer: BC Managed Care – PPO | Admitting: Physical Therapy

## 2022-11-29 DIAGNOSIS — M5459 Other low back pain: Secondary | ICD-10-CM

## 2022-11-29 DIAGNOSIS — M62830 Muscle spasm of back: Secondary | ICD-10-CM

## 2022-11-29 NOTE — Therapy (Signed)
OUTPATIENT PHYSICAL THERAPY LOWER EXTREMITY EVALUATION   Patient Name: Marie Castro MRN: [id number] DOB:1971/07/10, 52 y.o., female Today's Date: 11/29/2022  END OF SESSION:  PT End of Session - 11/29/22 1636     Visit Number 5    Number of Visits 8    Date for PT Re-Evaluation 12/29/22    PT Start Time 1600    PT Stop Time 1640    PT Time Calculation (min) 40 min    Activity Tolerance Patient tolerated treatment well    Behavior During Therapy [facility] for tasks assessed/performed               Past Medical History:  Diagnosis Date   Anxiety    Cancer ([place])    breast   Family history of breast cancer    Family history of heart disease    Family history of prostate cancer    History of radiation therapy 10/04/17-11/22/17   left breast 50.4 Gy in 28 fractions, left breast boost 10 Gy in 5 fractions, right breast 50.4 Gy in 28 fractions, right breast boost 12 Gy in 6 fractions   Hypothyroidism    Idiopathic urticaria    Malignant neoplasm of upper-outer quadrant of left female breast ([place])    Migraine    Overweight    Thyroid disease    Past Surgical History:  Procedure Laterality Date   BREAST BIOPSY Right 07/12/2017   BREAST LUMPECTOMY WITH RADIOACTIVE SEED AND SENTINEL LYMPH NODE BIOPSY Right 08/16/2017   Procedure: RIGHT BREAST LUMPECTOMY WITH RADIOACTIVE SEED AND RIGHT SENTINEL LYMPH NODE BIOPSY;  Surgeon: [name], MD;  Location: [place];  Service: General;  Laterality: Right;   BREAST LUMPECTOMY WITH RADIOACTIVE SEED LOCALIZATION Left 08/16/2017   Procedure: LEFT BREAST PARTIAL MASTECTOMY WITH BRACKETED RADIOACTIVE SEED LOCALIZATION;  Surgeon: [name], MD;  Location: [place];  Service: General;  Laterality: Left;   DILATION AND CURETTAGE OF UTERUS     KNEE SURGERY     MULTIPLE TOOTH EXTRACTIONS     RE-EXCISION OF BREAST LUMPECTOMY Left 08/27/2017   Procedure: LEFT RE-EXCISION OF BREAST LUMPECTOMY ERAS PATHWAY;  Surgeon: [name], MD;  Location: [place];  Service: General;  Laterality: Left;   [place] EXTRACTION     Patient Active Problem List   Diagnosis Date Noted   Family history of early CAD 03/13/2022   Mixed hyperlipidemia 03/13/2022   Ductal carcinoma in situ (DCIS) of breast 03/13/2022   S/P partial mastectomy, bilateral 12/17/2018   Breast asymmetry following reconstructive surgery 12/17/2018   Allergic reaction 05/20/2018   Genetic testing 07/31/2017   Malignant neoplasm of upper-inner quadrant of right breast in female, estrogen receptor positive ([place]) 07/24/2017   Family history of breast cancer    Family history of prostate cancer     PCP: [name] PA   REFERRING PROVIDER: Phyllis Ginger NP   REFERRING DIAG:  M54.50 (ICD-10-CM) - Low back pain, unspecified  M85.80 (ICD-10-CM) - Other specified disorders of bone density and structure, unspecified site    THERAPY DIAG:  Muscle spasm of back  Other low back pain  Rationale for Evaluation and Treatment: Rehabilitation  ONSET DATE: has had low back pain for a long time.   SUBJECTIVE:   SUBJECTIVE STATEMENT:  Pt states the pain is improving. Pt has had some trouble with the tennis ball self massage. She is able to continue with weight classes with good focus on form with reducing lumbar extension.   Eval:  Patient has prior history of long low back pain.  She had a significant episode on 12/3 to the point where she had a hard time standing.  The severe pain is improved. She continues to go to the gym. She has a dull pain in the right side of her lower back that has stayed with her. She also has a history of right knee pain.  The patient had a session of informal trigger point dry needling which helped her pain significantly.  She can still feel a constant dull ache on the right side.  PERTINENT HISTORY: Anxiety, cancer of the breast, migraines, Osteopenia,  PAIN:  Are you having pain? Yes: NPRS scale: 7/10 Pain location: right lower  back/ buttock Pain description: aching  Aggravating factors: standing and walking  Relieving factors: rest   PRECAUTIONS: None  WEIGHT BEARING RESTRICTIONS: No  FALLS:  Has patient fallen in last 6 months? No  LIVING ENVIRONMENT: Pain going up steps in her knee OCCUPATION: school OT   Hobbies:   PLOF: Independent  PATIENT GOALS: to have less pain   NEXT MD VISIT:  Nothing scheduled   OBJECTIVE:   DIAGNOSTIC FINDINGS:  Low back x-ray  INDICATION: Lumbar back pain  COMPARISON: None  TECHNIQUE: AP and lateral views of the thoracic spine on 3 films  FINDINGS: No evidence of acute fracture or dislocation. There is mild dextroscoliosis of the lower thoracic/visualized upper lumbar spine measuring approximately 17 degrees from the level of T10/T11 to the level of L2/L3. Vertebral body heights are preserved. Mild to moderate disc space narrowing in the midthoracic spine.   IMPRESSION: 1. No fracture or dislocation. Mild dextroscoliosis of the lower thoracic/visualized upper lumbar spine. 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   PATIENT SURVEYS:  Faida.[place] ]  COGNITION: Overall cognitive status: Within functional limits for tasks assessed     SENSATION: Pain can radiate into the buttock    MUSCLE LENGTH:  POSTURE: No Significant postural limitations  PALPATION: Tender to palpation in the right lateral low back   LOWER EXTREMITY ROM:  Passive ROM Right eval Left eval  Hip flexion WNL  WNL  Hip extension    Hip abduction WNL WNL  Hip adduction    Hip internal rotation    Hip external rotation    Knee flexion    Knee extension Mild pain  WNL  Ankle dorsiflexion    Ankle plantarflexion    Ankle inversion    Ankle eversion     (Blank rows = not tested)  LOWER EXTREMITY MMT:  MMT Right eval Left eval  Hip flexion 39.2 36.5  Hip extension    Hip abduction 41.0 40.9  Hip adduction    Hip internal rotation    Hip external rotation     Knee flexion    Knee extension 59.0 57  Ankle dorsiflexion    Ankle plantarflexion    Ankle inversion    Ankle eversion     (Blank rows = not tested)  LUMBAR ROM:   Active  A/PROM  eval  Flexion Full/ Pain free   Extension Full but pain full   Right lateral flexion   Left lateral flexion   Right rotation Painful   Left rotation No pain full    (Blank rows = not tested)       GAIT: Mild lateral movement to the left and right with gait.    TODAY'S TREATMENT:  DATE:   2/21  Manual: trigger point release to R glutes and hip rotators; QL release R UPA L3-5 grade IV  Exercises - Standing QL Mobilization with Small Ball Table   2-3 min  hold - Seated Quadratus Lumborum Stretch with Arm Overhead   sets - 3 reps - 30 hold - Seated Thoracic Lumbar Extension with Pectoralis Stretch 2 sets - 10 reps - 5 hold - Open Book Chest Rotation Stretch on Foam 1/2 Roll  2 sets - 10 reps - 5 hold - Piriformis Mobilization on Foam Roll  1 sets - 1 reps - 2-3 min hold 2/14 Trigger Point Dry-Needling  Treatment instructions: Expect mild to moderate muscle soreness. S/S of pneumothorax if dry needled over a lung field, and to seek immediate medical attention should they occur. Patient verbalized understanding of these instructions and education.   Patient Consent Given: Yes Education (verbally/handout)provided: Yes Muscles treated: R L3-4 paraspinals/multifidi; right gluteal 3x using a .30x50 neefle  Electrical stimulation performed: no Parameters:   n/a Treatment response/outcome: great twitch in gluteal   Manual: trigger point release to gluteal   Row cable  215 10 lbs  with review of breathing and posture  Cable extension 2x15 10 lbs   Nu-step L5 5 min   Reviewed concepts for shoulder strengthening and concepts for core activation    1/30  Trigger  Point Dry-Needling  Treatment instructions: Expect mild to moderate muscle soreness. S/S of pneumothorax if dry needled over a lung field, and to seek immediate medical attention should they occur. Patient verbalized understanding of these instructions and education.   Patient Consent Given: Yes Education (verbally/handout)provided: Yes Muscles treated: R L3-4 paraspinals/multifidi Electrical stimulation performed: no Parameters:   n/a Treatment response/outcome: large twitch response elicited at L4, improve soft tissue extensibility  STM R lumbar paraspinals and QL  Seated figure 4 with QL stretch 30s 3x bilat Wall angel 2x10 Seated T/S extension with towel roll 5s 10x Edu regarding neutral spine, abdominal bracing, reducing of exaggerated lumbar extension with OH lifting and squatting, influences of shoulder and T/S mobility on lumbar extension; modifying of class exercises to reduce pain     Previous:  Exercises - Supine Piriformis Stretch with Foot on Ground  - 1 x daily - 7 x weekly - 3 sets - 3 reps - 20 hold - Child's Pose Stretch  - 1 x daily - 7 x weekly - 3 sets - 3 reps - 20 hold - Child's Pose with Sidebending  - 1 x daily - 7 x weekly - 3 sets - 3 reps - 20 hold - Theracane Over Shoulder  - 1 x daily - 7 x weekly - 3 sets - 10 reps - 1-2 min  hold - Standing Glute Med Mobilization with Small Ball on Wall  - 1 x daily - 7 x weekly - 2-3 min  hold   PATIENT EDUCATION:  Education details: form and technique with lifting to avoid LBP, anatomy, exercise progression, acceptable levels of pain,  envelope of function, HEP, POC   Person educated: Patient Education method: Explanation, Demonstration, Tactile cues, Verbal cues, and Handouts Education comprehension: verbalized understanding, returned demonstration, verbal cues required, tactile cues required, and needs further education  HOME EXERCISE PROGRAM: Access Code: [email or web address] URL: [email or web address] Date:  11/06/2022 Prepared by: Carolyne Littles  Exercises - Supine Piriformis Stretch with Foot on Ground  - 1 x daily - 7 x weekly - 3 sets - 3 reps - 20 hold - Child's Pose  Stretch  - 1 x daily - 7 x weekly - 3 sets - 3 reps - 20 hold - Child's Pose with Sidebending  - 1 x daily - 7 x weekly - 3 sets - 3 reps - 20 hold - Theracane Over Shoulder  - 1 x daily - 7 x weekly - 3 sets - 10 reps - 1-2 min  hold - Standing Glute Med Mobilization with Small Ball on Wall  - 1 x daily - 7 x weekly - 2-3 min  hold  ASSESSMENT:  CLINICAL IMPRESSION: Pt presents with increased R hip pain consistent with soft tissue hypertonicity from exercise class. Session focused on pain relief and self mobilization techniques. Pt advised to use foam roller or tennis ball but dynamic warm up will also produce similar results prior to exercise session. Pt given T/S mobility and anterior shoulder flexibility exercise to reduce lumbar extension compensation with lifting. Plan to start lumbopelvic stability/strength at next and TPDN PRN. Pt would benefit from continued skilled therapy in order to reach goals and maximize functional lumbopelvic strength and motor control for return to PLOF.    OBJECTIVE IMPAIRMENTS: decreased ROM, decreased strength, increased muscle spasms, and pain.   ACTIVITY LIMITATIONS: sitting, standing, and locomotion level  PARTICIPATION LIMITATIONS: meal prep, cleaning, laundry, shopping, community activity, and occupation  PERSONAL FACTORS: 1-2 comorbidities: right knee pain; osteopenia   are also affecting patient's functional outcome.   REHAB POTENTIAL: Excellent  CLINICAL DECISION MAKING: Stable/uncomplicated  EVALUATION COMPLEXITY: Low   GOALS: Goals reviewed with patient? Yes  SHORT TERM GOALS: Target date: 11/27/2022   Patient will demonstrate full extension without increased lower back pain Baseline: Goal status: INITIAL  2.  Patient will demonstrate full right rotation without  increased low back pain Baseline:  Goal status: INITIAL  3.  Patient will be independent with basic HEP for stretching and strengthening Baseline:  Goal status: INITIAL  LONG TERM GOALS: Target date: 12/18/2022    Patient will return to full gym program without increased pain Baseline:  Goal status: INITIAL  2.  Patient will demonstrate full active lumbar motion with IADLs without increased low back pain Baseline:  Goal status: INITIAL  3.  Patient will stand and walk community distances without report of increased low back pain Baseline:  Goal status: INITIAL  PLAN:  PT FREQUENCY: 1x/week  PT DURATION: 6 weeks  PLANNED INTERVENTIONS: Therapeutic exercises, Therapeutic activity, Neuromuscular re-education, Gait training, Patient/Family education, Self Care, Joint mobilization, Stair training, Aquatic Therapy, Dry Needling, Cryotherapy, Moist heat, Taping, and Manual therapy  PLAN FOR NEXT SESSION: Consider trigger point dry needling of the lumbar spine, consider trigger point release to upper gluteal and lower lumbar spine, reviewed based core strengthening exercises.  Review exercises in the gym that she can continue to use.  Progress core exercises as tolerated   Daleen Bo, PT 11/29/2022, 4:48 PM

## 2022-12-06 ENCOUNTER — Ambulatory Visit (HOSPITAL_BASED_OUTPATIENT_CLINIC_OR_DEPARTMENT_OTHER): Payer: BC Managed Care – PPO | Admitting: Physical Therapy

## 2022-12-14 ENCOUNTER — Encounter (HOSPITAL_BASED_OUTPATIENT_CLINIC_OR_DEPARTMENT_OTHER): Payer: Self-pay | Admitting: Physical Therapy

## 2022-12-14 ENCOUNTER — Ambulatory Visit (HOSPITAL_BASED_OUTPATIENT_CLINIC_OR_DEPARTMENT_OTHER): Payer: BC Managed Care – PPO | Attending: Nurse Practitioner | Admitting: Physical Therapy

## 2022-12-14 DIAGNOSIS — M5459 Other low back pain: Secondary | ICD-10-CM | POA: Insufficient documentation

## 2022-12-14 DIAGNOSIS — M62830 Muscle spasm of back: Secondary | ICD-10-CM | POA: Diagnosis present

## 2022-12-14 NOTE — Therapy (Signed)
OUTPATIENT PHYSICAL THERAPY LOWER EXTREMITY Treatment    Patient Name: Marie Castro MRN: [id number] DOB:Apr 29, 1971, 52 y.o., female Today's Date: 12/14/2022  END OF SESSION:  PT End of Session - 12/14/22 1102     Visit Number 6    Number of Visits 8    Date for PT Re-Evaluation 12/29/22    PT Start Time 1100    PT Stop Time 1143    PT Time Calculation (min) 43 min    Activity Tolerance Patient tolerated treatment well    Behavior During Therapy WFL for tasks assessed/performed               Past Medical History:  Diagnosis Date   Anxiety    Cancer ([place])    breast   Family history of breast cancer    Family history of heart disease    Family history of prostate cancer    History of radiation therapy 10/04/17-11/22/17   left breast 50.4 Gy in 28 fractions, left breast boost 10 Gy in 5 fractions, right breast 50.4 Gy in 28 fractions, right breast boost 12 Gy in 6 fractions   Hypothyroidism    Idiopathic urticaria    Malignant neoplasm of upper-outer quadrant of left female breast ([place])    Migraine    Overweight    Thyroid disease    Past Surgical History:  Procedure Laterality Date   BREAST BIOPSY Right 07/12/2017   BREAST LUMPECTOMY WITH RADIOACTIVE SEED AND SENTINEL LYMPH NODE BIOPSY Right 08/16/2017   Procedure: RIGHT BREAST LUMPECTOMY WITH RADIOACTIVE SEED AND RIGHT SENTINEL LYMPH NODE BIOPSY;  Surgeon: [name], MD;  Location: [place];  Service: General;  Laterality: Right;   BREAST LUMPECTOMY WITH RADIOACTIVE SEED LOCALIZATION Left 08/16/2017   Procedure: LEFT BREAST PARTIAL MASTECTOMY WITH BRACKETED RADIOACTIVE SEED LOCALIZATION;  Surgeon: [name], MD;  Location: [facility];  Service: General;  Laterality: Left;   DILATION AND CURETTAGE OF UTERUS     KNEE SURGERY     MULTIPLE TOOTH EXTRACTIONS     RE-EXCISION OF BREAST LUMPECTOMY Left 08/27/2017   Procedure: LEFT RE-EXCISION OF BREAST LUMPECTOMY ERAS PATHWAY;  Surgeon: [name], MD;  Location: [place];  Service: General;  Laterality: Left;   [place] EXTRACTION     Patient Active Problem List   Diagnosis Date Noted   Family history of early CAD 03/13/2022   Mixed hyperlipidemia 03/13/2022   Ductal carcinoma in situ (DCIS) of breast 03/13/2022   S/P partial mastectomy, bilateral 12/17/2018   Breast asymmetry following reconstructive surgery 12/17/2018   Allergic reaction 05/20/2018   Genetic testing 07/31/2017   Malignant neoplasm of upper-inner quadrant of right breast in female, estrogen receptor positive ([place]) 07/24/2017   Family history of breast cancer    Family history of prostate cancer     PCP: [name] PA   REFERRING PROVIDER: Phyllis Ginger NP   REFERRING DIAG:  M54.50 (ICD-10-CM) - Low back pain, unspecified  M85.80 (ICD-10-CM) - Other specified disorders of bone density and structure, unspecified site    THERAPY DIAG:  Muscle spasm of back  Other low back pain  Rationale for Evaluation and Treatment: Rehabilitation  ONSET DATE: has had low back pain for a long time.   SUBJECTIVE:   SUBJECTIVE STATEMENT:  The patient has very little pain at this time. She has not been able to get back to classes yet.    Eval: Patient has prior history of long low back pain.  She had  a significant episode on 12/3 to the point where she had a hard time standing.  The severe pain is improved. She continues to go to the gym. She has a dull pain in the right side of her lower back that has stayed with her. She also has a history of right knee pain.  The patient had a session of informal trigger point dry needling which helped her pain significantly.  She can still feel a constant dull ache on the right side.  PERTINENT HISTORY: Anxiety, cancer of the breast, migraines, Osteopenia,  PAIN:  Are you having pain? Yes: NPRS scale: 7/10 Pain location: right lower back/ buttock Pain description: aching  Aggravating factors: standing and walking  Relieving  factors: rest   PRECAUTIONS: None  WEIGHT BEARING RESTRICTIONS: No  FALLS:  Has patient fallen in last 6 months? No  LIVING ENVIRONMENT: Pain going up steps in her knee OCCUPATION: school OT   Hobbies:   PLOF: Independent  PATIENT GOALS: to have less pain   NEXT MD VISIT:  Nothing scheduled   OBJECTIVE:   DIAGNOSTIC FINDINGS:  Low back x-ray  INDICATION: Lumbar back pain  COMPARISON: None  TECHNIQUE: AP and lateral views of the thoracic spine on 3 films  FINDINGS: No evidence of acute fracture or dislocation. There is mild dextroscoliosis of the lower thoracic/visualized upper lumbar spine measuring approximately 17 degrees from the level of T10/T11 to the level of L2/L3. Vertebral body heights are preserved. Mild to moderate disc space narrowing in the midthoracic spine.   IMPRESSION: 1. No fracture or dislocation. Mild dextroscoliosis of the lower thoracic/visualized upper lumbar spine. 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   PATIENT SURVEYS:  [place] ]  COGNITION: Overall cognitive status: Within functional limits for tasks assessed     SENSATION: Pain can radiate into the buttock    MUSCLE LENGTH:  POSTURE: No Significant postural limitations  PALPATION: Tender to palpation in the right lateral low back   LOWER EXTREMITY ROM:  Passive ROM Right eval Left eval  Hip flexion WNL  WNL  Hip extension    Hip abduction WNL WNL  Hip adduction    Hip internal rotation    Hip external rotation    Knee flexion    Knee extension Mild pain  WNL  Ankle dorsiflexion    Ankle plantarflexion    Ankle inversion    Ankle eversion     (Blank rows = not tested)  LOWER EXTREMITY MMT:  MMT Right eval Left eval  Hip flexion 39.2 36.5  Hip extension    Hip abduction 41.0 40.9  Hip adduction    Hip internal rotation    Hip external rotation    Knee flexion    Knee extension 59.0 57  Ankle dorsiflexion    Ankle plantarflexion     Ankle inversion    Ankle eversion     (Blank rows = not tested)  LUMBAR ROM:   Active  A/PROM  eval  Flexion Full/ Pain free   Extension Full but pain full   Right lateral flexion   Left lateral flexion   Right rotation Painful   Left rotation No pain full    (Blank rows = not tested)       GAIT: Mild lateral movement to the left and right with gait.    TODAY'S TREATMENT:  DATE:  3/7 Reviewed gym exercises: reviewed set up, posture and breathing with each   LF 15 Triceps 25 lbs 3x15  Cybex Row x15 30 lbs   Cybex leg press 90 lbs Seat 5 Back 4  LF leg press 25 lbs   LF hip abduction 60-70 to start    Cable:  Row 10lbs  Shoulder Extension 10 lbs  Biceps curl 5 lbs   2/21  Manual: trigger point release to R glutes and hip rotators; QL release R UPA L3-5 grade IV  Exercises - Standing QL Mobilization with Small Ball Table   2-3 min  hold - Seated Quadratus Lumborum Stretch with Arm Overhead   sets - 3 reps - 30 hold - Seated Thoracic Lumbar Extension with Pectoralis Stretch 2 sets - 10 reps - 5 hold - Open Book Chest Rotation Stretch on Foam 1/2 Roll  2 sets - 10 reps - 5 hold - Piriformis Mobilization on Foam Roll  1 sets - 1 reps - 2-3 min hold 2/14 Trigger Point Dry-Needling  Treatment instructions: Expect mild to moderate muscle soreness. S/S of pneumothorax if dry needled over a lung field, and to seek immediate medical attention should they occur. Patient verbalized understanding of these instructions and education.   Patient Consent Given: Yes Education (verbally/handout)provided: Yes Muscles treated: R L3-4 paraspinals/multifidi; right gluteal 3x using a .30x50 neefle  Electrical stimulation performed: no Parameters:   n/a Treatment response/outcome: great twitch in gluteal   Manual: trigger point release to gluteal   Row  cable  215 10 lbs  with review of breathing and posture  Cable extension 2x15 10 lbs   Nu-step L5 5 min   Reviewed concepts for shoulder strengthening and concepts for core activation    1/30  Trigger Point Dry-Needling  Treatment instructions: Expect mild to moderate muscle soreness. S/S of pneumothorax if dry needled over a lung field, and to seek immediate medical attention should they occur. Patient verbalized understanding of these instructions and education.   Patient Consent Given: Yes Education (verbally/handout)provided: Yes Muscles treated: R L3-4 paraspinals/multifidi Electrical stimulation performed: no Parameters:   n/a Treatment response/outcome: large twitch response elicited at L4, improve soft tissue extensibility  STM R lumbar paraspinals and QL  Seated figure 4 with QL stretch 30s 3x bilat Wall angel 2x10 Seated T/S extension with towel roll 5s 10x Edu regarding neutral spine, abdominal bracing, reducing of exaggerated lumbar extension with OH lifting and squatting, influences of shoulder and T/S mobility on lumbar extension; modifying of class exercises to reduce pain     Previous:  Exercises - Supine Piriformis Stretch with Foot on Ground  - 1 x daily - 7 x weekly - 3 sets - 3 reps - 20 hold - Child's Pose Stretch  - 1 x daily - 7 x weekly - 3 sets - 3 reps - 20 hold - Child's Pose with Sidebending  - 1 x daily - 7 x weekly - 3 sets - 3 reps - 20 hold - Theracane Over Shoulder  - 1 x daily - 7 x weekly - 3 sets - 10 reps - 1-2 min  hold - Standing Glute Med Mobilization with Small Ball on Wall  - 1 x daily - 7 x weekly - 2-3 min  hold   PATIENT EDUCATION:  Education details: form and technique with lifting to avoid LBP, anatomy, exercise progression, acceptable levels of pain,  envelope of function, HEP, POC   Person educated: Patient Education method: Explanation, Demonstration,  Tactile cues, Verbal cues, and Handouts Education comprehension: verbalized  understanding, returned demonstration, verbal cues required, tactile cues required, and needs further education  HOME EXERCISE PROGRAM: Access Code: [email or web address] URL: [email or web address] Date: 11/06/2022 Prepared by: Carolyne Littles  Exercises - Supine Piriformis Stretch with Foot on Ground  - 1 x daily - 7 x weekly - 3 sets - 3 reps - 20 hold - Child's Pose Stretch  - 1 x daily - 7 x weekly - 3 sets - 3 reps - 20 hold - Child's Pose with Sidebending  - 1 x daily - 7 x weekly - 3 sets - 3 reps - 20 hold - Theracane Over Shoulder  - 1 x daily - 7 x weekly - 3 sets - 10 reps - 1-2 min  hold - Standing Glute Med Mobilization with Small Ball on Wall  - 1 x daily - 7 x weekly - 2-3 min  hold  ASSESSMENT:  CLINICAL IMPRESSION: Therapy reviewed baseline gym exercises. She tolerated well. We reviewed set up of different equipment. She was advised to monitor her symptoms over the next few days. She was advised to progress her weights slowly. She may also work on [employer]. She talked about planks. She was advised she can do it but listen to her back. We will re-assess the patient next visit. We looked at [facility] today.She had a good improvement in [place]. If the patient is comfortable with her plan we may discharge next visit.     OBJECTIVE IMPAIRMENTS: decreased ROM, decreased strength, increased muscle spasms, and pain.   ACTIVITY LIMITATIONS: sitting, standing, and locomotion level  PARTICIPATION LIMITATIONS: meal prep, cleaning, laundry, shopping, community activity, and occupation  PERSONAL FACTORS: 1-2 comorbidities: right knee pain; osteopenia   are also affecting patient's functional outcome.   REHAB POTENTIAL: Excellent  CLINICAL DECISION MAKING: Stable/uncomplicated  EVALUATION COMPLEXITY: Low   GOALS: Goals reviewed with patient? Yes  SHORT TERM GOALS: Target date: 11/27/2022   Patient will demonstrate full extension without increased lower back pain Baseline: Goal  status: INITIAL  2.  Patient will demonstrate full right rotation without increased low back pain Baseline:  Goal status: INITIAL  3.  Patient will be independent with basic HEP for stretching and strengthening Baseline:  Goal status: INITIAL  LONG TERM GOALS: Target date: 12/18/2022    Patient will return to full gym program without increased pain Baseline:  Goal status: INITIAL  2.  Patient will demonstrate full active lumbar motion with IADLs without increased low back pain Baseline:  Goal status: INITIAL  3.  Patient will stand and walk community distances without report of increased low back pain Baseline:  Goal status: INITIAL  PLAN:  PT FREQUENCY: 1x/week  PT DURATION: 6 weeks  PLANNED INTERVENTIONS: Therapeutic exercises, Therapeutic activity, Neuromuscular re-education, Gait training, Patient/Family education, Self Care, Joint mobilization, Stair training, Aquatic Therapy, Dry Needling, Cryotherapy, Moist heat, Taping, and Manual therapy  PLAN FOR NEXT SESSION: Consider trigger point dry needling of the lumbar spine, consider trigger point release to upper gluteal and lower lumbar spine, reviewed based core strengthening exercises.  Review exercises in the gym that she can continue to use.  Progress core exercises as tolerated   Carolyne Littles PT DPT  12/14/2022, 3:50 PM

## 2022-12-27 ENCOUNTER — Ambulatory Visit (HOSPITAL_BASED_OUTPATIENT_CLINIC_OR_DEPARTMENT_OTHER): Payer: BC Managed Care – PPO | Admitting: Physical Therapy

## 2022-12-27 DIAGNOSIS — M62830 Muscle spasm of back: Secondary | ICD-10-CM | POA: Diagnosis not present

## 2022-12-27 DIAGNOSIS — M5459 Other low back pain: Secondary | ICD-10-CM

## 2022-12-27 NOTE — Therapy (Addendum)
OUTPATIENT PHYSICAL THERAPY LOWER EXTREMITY Treatment    Patient Name: Marie Castro MRN: [id number] DOB:August 29, 1971, 52 y.o., female Today's Date: 12/27/2022  END OF SESSION:  PT End of Session - 12/27/22 1623     Visit Number 7    Number of Visits 8    Date for PT Re-Evaluation 03/27/23    PT Start Time 1620    PT Stop Time 1700    PT Time Calculation (min) 40 min    Activity Tolerance Patient tolerated treatment well    Behavior During Therapy [facility] for tasks assessed/performed                Past Medical History:  Diagnosis Date   Anxiety    Cancer ([place])    breast   Family history of breast cancer    Family history of heart disease    Family history of prostate cancer    History of radiation therapy 10/04/17-11/22/17   left breast 50.4 Gy in 28 fractions, left breast boost 10 Gy in 5 fractions, right breast 50.4 Gy in 28 fractions, right breast boost 12 Gy in 6 fractions   Hypothyroidism    Idiopathic urticaria    Malignant neoplasm of upper-outer quadrant of left female breast (HCC)    Migraine    Overweight    Thyroid disease    Past Surgical History:  Procedure Laterality Date   BREAST BIOPSY Right 07/12/2017   BREAST LUMPECTOMY WITH RADIOACTIVE SEED AND SENTINEL LYMPH NODE BIOPSY Right 08/16/2017   Procedure: RIGHT BREAST LUMPECTOMY WITH RADIOACTIVE SEED AND RIGHT SENTINEL LYMPH NODE BIOPSY;  Surgeon: [name], MD;  Location: [place];  Service: General;  Laterality: Right;   BREAST LUMPECTOMY WITH RADIOACTIVE SEED LOCALIZATION Left 08/16/2017   Procedure: LEFT BREAST PARTIAL MASTECTOMY WITH BRACKETED RADIOACTIVE SEED LOCALIZATION;  Surgeon: [name], MD;  Location: [place];  Service: General;  Laterality: Left;   DILATION AND CURETTAGE OF UTERUS     KNEE SURGERY     MULTIPLE TOOTH EXTRACTIONS     RE-EXCISION OF BREAST LUMPECTOMY Left 08/27/2017   Procedure: LEFT RE-EXCISION OF BREAST LUMPECTOMY ERAS PATHWAY;  Surgeon: [name], MD;  Location: [place];  Service: General;  Laterality: Left;   [place] EXTRACTION     Patient Active Problem List   Diagnosis Date Noted   Family history of early CAD 03/13/2022   Mixed hyperlipidemia 03/13/2022   Ductal carcinoma in situ (DCIS) of breast 03/13/2022   S/P partial mastectomy, bilateral 12/17/2018   Breast asymmetry following reconstructive surgery 12/17/2018   Allergic reaction 05/20/2018   Genetic testing 07/31/2017   Malignant neoplasm of upper-inner quadrant of right breast in female, estrogen receptor positive ([place]) 07/24/2017   Family history of breast cancer    Family history of prostate cancer     PCP: [name] PA   REFERRING PROVIDER: Phyllis Ginger NP   REFERRING DIAG:  M54.50 (ICD-10-CM) - Low back pain, unspecified  M85.80 (ICD-10-CM) - Other specified disorders of bone density and structure, unspecified site    THERAPY DIAG:  Muscle spasm of back - Plan: PT plan of care cert/re-cert  Other low back pain - Plan: PT plan of care cert/re-cert  Rationale for Evaluation and Treatment: Rehabilitation  ONSET DATE: has had low back pain for a long time.   SUBJECTIVE:   SUBJECTIVE STATEMENT:  Pt states she has had some LBP due to travel recently. She woke up over the weekend with severe upper back/neck  pain. It feels better today and it has gotten better. Pt is concerned about the neck pain and LBP. She has had to stop exercise recently due to a family situation.   Back pain itself has improved greatly but may be related to her recent change in activity.   Eval: Patient has prior history of long low back pain.  She had a significant episode on 12/3 to the point where she had a hard time standing.  The severe pain is improved. She continues to go to the gym. She has a dull pain in the right side of her lower back that has stayed with her. She also has a history of right knee pain.  The patient had a session of informal trigger point dry needling  which helped her pain significantly.  She can still feel a constant dull ache on the right side.  PERTINENT HISTORY: Anxiety, cancer of the breast, migraines, Osteopenia,  PAIN:  Are you having pain? No: NPRS scale: 0/10 Pain location: right lower back/ buttock Pain description: aching  Aggravating factors: standing and walking  Relieving factors: rest   PRECAUTIONS: None  WEIGHT BEARING RESTRICTIONS: No  FALLS:  Has patient fallen in last 6 months? No  LIVING ENVIRONMENT: Pain going up steps in her knee OCCUPATION: school OT   Hobbies:   PLOF: Independent  PATIENT GOALS: to have less pain   NEXT MD VISIT:  Nothing scheduled   OBJECTIVE:   DIAGNOSTIC FINDINGS:  Low back x-ray  INDICATION: Lumbar back pain  COMPARISON: None  TECHNIQUE: AP and lateral views of the thoracic spine on 3 films  FINDINGS: No evidence of acute fracture or dislocation. There is mild dextroscoliosis of the lower thoracic/visualized upper lumbar spine measuring approximately 17 degrees from the level of T10/T11 to the level of L2/L3. Vertebral body heights are preserved. Mild to moderate disc space narrowing in the midthoracic spine.   IMPRESSION: 1. No fracture or dislocation. Mild dextroscoliosis of the lower thoracic/visualized upper lumbar spine. 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   PATIENT SURVEYS:  FOTO 57  83 FOTO 3/20  COGNITION: Overall cognitive status: Within functional limits for tasks assessed     SENSATION: No more radiating pain down the R buttock    MUSCLE LENGTH:  POSTURE: No Significant postural limitations  PALPATION: Tender to palpation in the right lateral low back   LOWER EXTREMITY ROM:  Passive ROM Right 3/20 Left 3/20  Hip flexion WNL  WNL  Hip extension    Hip abduction WNL WNL  Hip adduction    Hip internal rotation    Hip external rotation    Knee flexion    Knee extension WNL  WNL  Ankle dorsiflexion    Ankle  plantarflexion    Ankle inversion    Ankle eversion     (Blank rows = not tested)  LUMBAR ROM:   Active  A/PROM  3/20  Flexion full  Extension Full p!  Right lateral flexion full  Left lateral flexion full  Right rotation Full  p!  Left rotation Full   (Blank rows = not tested)       TODAY'S TREATMENT:  DATE:  3/20  R UPA and CPA of T6-L5 STM of R lumbar and thoracic paraspinals  Self pain management with inflammation of pain or future injuries, progression of exercise, referral and transition to personal training    3/7 Reviewed gym exercises: reviewed set up, posture and breathing with each   LF 15 Triceps 25 lbs 3x15  Cybex Row x15 30 lbs   Cybex leg press 90 lbs Seat 5 Back 4  LF leg press 25 lbs   LF hip abduction 60-70 to start    Cable:  Row 10lbs  Shoulder Extension 10 lbs  Biceps curl 5 lbs   2/21  Manual: trigger point release to R glutes and hip rotators; QL release R UPA L3-5 grade IV  Exercises - Standing QL Mobilization with Small Ball Table   2-3 min  hold - Seated Quadratus Lumborum Stretch with Arm Overhead   sets - 3 reps - 30 hold - Seated Thoracic Lumbar Extension with Pectoralis Stretch 2 sets - 10 reps - 5 hold - Open Book Chest Rotation Stretch on Foam 1/2 Roll  2 sets - 10 reps - 5 hold - Piriformis Mobilization on Foam Roll  1 sets - 1 reps - 2-3 min hold 2/14 Trigger Point Dry-Needling  Treatment instructions: Expect mild to moderate muscle soreness. S/S of pneumothorax if dry needled over a lung field, and to seek immediate medical attention should they occur. Patient verbalized understanding of these instructions and education.   Patient Consent Given: Yes Education (verbally/handout)provided: Yes Muscles treated: R L3-4 paraspinals/multifidi; right gluteal 3x using a .30x50 neefle  Electrical  stimulation performed: no Parameters:   n/a Treatment response/outcome: great twitch in gluteal   Manual: trigger point release to gluteal   Row cable  215 10 lbs  with review of breathing and posture  Cable extension 2x15 10 lbs   Nu-step L5 5 min   Reviewed concepts for shoulder strengthening and concepts for core activation    1/30  Trigger Point Dry-Needling  Treatment instructions: Expect mild to moderate muscle soreness. S/S of pneumothorax if dry needled over a lung field, and to seek immediate medical attention should they occur. Patient verbalized understanding of these instructions and education.   Patient Consent Given: Yes Education (verbally/handout)provided: Yes Muscles treated: R L3-4 paraspinals/multifidi Electrical stimulation performed: no Parameters:   n/a Treatment response/outcome: large twitch response elicited at L4, improve soft tissue extensibility  STM R lumbar paraspinals and QL  Seated figure 4 with QL stretch 30s 3x bilat Wall angel 2x10 Seated T/S extension with towel roll 5s 10x Edu regarding neutral spine, abdominal bracing, reducing of exaggerated lumbar extension with OH lifting and squatting, influences of shoulder and T/S mobility on lumbar extension; modifying of class exercises to reduce pain     Previous:  Exercises - Supine Piriformis Stretch with Foot on Ground  - 1 x daily - 7 x weekly - 3 sets - 3 reps - 20 hold - Child's Pose Stretch  - 1 x daily - 7 x weekly - 3 sets - 3 reps - 20 hold - Child's Pose with Sidebending  - 1 x daily - 7 x weekly - 3 sets - 3 reps - 20 hold - Theracane Over Shoulder  - 1 x daily - 7 x weekly - 3 sets - 10 reps - 1-2 min  hold - Standing Glute Med Mobilization with Small Ball on Wall  - 1 x daily - 7 x weekly - 2-3 min  hold   PATIENT EDUCATION:  Education details: anatomy, exercise progression, acceptable levels of pain,  envelope of function, HEP, POC   Person educated: Patient Education method:  Explanation, Demonstration, Tactile cues, Verbal cues, and Handouts Education comprehension: verbalized understanding, returned demonstration, verbal cues required, tactile cues required, and needs further education  HOME EXERCISE PROGRAM: Access Code: [email or web address] URL: [email or web address] Date: 11/06/2022 Prepared by: Carolyne Littles   ASSESSMENT:  CLINICAL IMPRESSION: Pt with recurrent pain recently that appears related to global stiffness of lower T/S and L/S. Pt does have full ROM but continues to have pain with closing pattern on the R. Pt responded extremely well to manual mobilizations with abolishment of pain by end of session. Pt to schedule at 1x every 2-3wks for progression of exercise in the gym setting. Given pt's recent caregiver responsibilities, pt has transitioned from guided classes to self directed gym strength. Plan to develop exercise program moving forward for strength and mobility of the lumbopelvic region and transition to personal training. Pt would benefit from continued skilled therapy in order to reach goals and maximize functional lumbopelvic strength and ROM for full return to PLOF.   OBJECTIVE IMPAIRMENTS: decreased ROM, decreased strength, increased muscle spasms, and pain.   ACTIVITY LIMITATIONS: sitting, standing, and locomotion level  PARTICIPATION LIMITATIONS: meal prep, cleaning, laundry, shopping, community activity, and occupation  PERSONAL FACTORS: 1-2 comorbidities: right knee pain; osteopenia   are also affecting patient's functional outcome.   REHAB POTENTIAL: Excellent  CLINICAL DECISION MAKING: Stable/uncomplicated  EVALUATION COMPLEXITY: Low   GOALS: Goals reviewed with patient? Yes  SHORT TERM GOALS: Target date: 11/27/2022   Patient will demonstrate full extension without increased lower back pain Baseline: Goal status: ongoing  2.  Patient will demonstrate full right rotation without increased low back pain Baseline:   Goal status: ongoing  3.  Patient will be independent with basic HEP for stretching and strengthening Baseline:  Goal status: met  LONG TERM GOALS: Target date: 03/27/2023   Patient will return to full gym program without increased pain Baseline:  Goal status: ongoing  2.  Patient will demonstrate full active lumbar motion with IADLs without increased low back pain Baseline:  Goal status: ongoing  3.  Patient will stand and walk community distances without report of increased low back pain Baseline:  Goal status: MET  PLAN:  PT FREQUENCY: 1x/3 wks  PT DURATION: 12 wks (likely D/C by 6)  PLANNED INTERVENTIONS: Therapeutic exercises, Therapeutic activity, Neuromuscular re-education, Gait training, Patient/Family education, Self Care, Joint mobilization, Stair training, Aquatic Therapy, Dry Needling, Cryotherapy, Moist heat, Taping, and Manual therapy  PLAN FOR NEXT SESSION: Manual therapy and TPDN PRN,  develop gym strengthening program, T/S and L/S mobility, Progress core exercises as tolerated  Daleen Bo PT, DPT 12/27/22 5:12 PM

## 2023-01-19 ENCOUNTER — Encounter (HOSPITAL_BASED_OUTPATIENT_CLINIC_OR_DEPARTMENT_OTHER): Payer: Self-pay | Admitting: Physical Therapy

## 2023-01-29 ENCOUNTER — Encounter (HOSPITAL_BASED_OUTPATIENT_CLINIC_OR_DEPARTMENT_OTHER): Payer: Self-pay | Admitting: Physical Therapy

## 2023-01-29 ENCOUNTER — Ambulatory Visit (HOSPITAL_BASED_OUTPATIENT_CLINIC_OR_DEPARTMENT_OTHER): Payer: BC Managed Care – PPO | Attending: Nurse Practitioner | Admitting: Physical Therapy

## 2023-01-29 DIAGNOSIS — M62830 Muscle spasm of back: Secondary | ICD-10-CM

## 2023-01-29 DIAGNOSIS — M5459 Other low back pain: Secondary | ICD-10-CM | POA: Diagnosis present

## 2023-01-29 NOTE — Therapy (Signed)
OUTPATIENT PHYSICAL THERAPY LOWER EXTREMITY Treatment    Patient Name: Marie Castro MRN: [id number] DOB:29-Jun-1971, 52 y.o., female Today's Date: 01/29/2023  END OF SESSION:  PT End of Session - 01/29/23 1140     Visit Number 8    Number of Visits 8    Date for PT Re-Evaluation 03/27/23    PT Start Time 1100    PT Stop Time 1130    PT Time Calculation (min) 30 min    Activity Tolerance Patient tolerated treatment well    Behavior During Therapy [facility] for tasks assessed/performed                 Past Medical History:  Diagnosis Date   Anxiety    Cancer    breast   Family history of breast cancer    Family history of heart disease    Family history of prostate cancer    History of radiation therapy 10/04/17-11/22/17   left breast 50.4 Gy in 28 fractions, left breast boost 10 Gy in 5 fractions, right breast 50.4 Gy in 28 fractions, right breast boost 12 Gy in 6 fractions   Hypothyroidism    Idiopathic urticaria    Malignant neoplasm of upper-outer quadrant of left female breast    Migraine    Overweight    Thyroid disease    Past Surgical History:  Procedure Laterality Date   BREAST BIOPSY Right 07/12/2017   BREAST LUMPECTOMY WITH RADIOACTIVE SEED AND SENTINEL LYMPH NODE BIOPSY Right 08/16/2017   Procedure: RIGHT BREAST LUMPECTOMY WITH RADIOACTIVE SEED AND RIGHT SENTINEL LYMPH NODE BIOPSY;  Surgeon: Almond Lint, MD;  Location: [facility] OR;  Service: General;  Laterality: Right;   BREAST LUMPECTOMY WITH RADIOACTIVE SEED LOCALIZATION Left 08/16/2017   Procedure: LEFT BREAST PARTIAL MASTECTOMY WITH BRACKETED RADIOACTIVE SEED LOCALIZATION;  Surgeon: Almond Lint, MD;  Location: [facility] OR;  Service: General;  Laterality: Left;   DILATION AND CURETTAGE OF UTERUS     KNEE SURGERY     MULTIPLE TOOTH EXTRACTIONS     RE-EXCISION OF BREAST LUMPECTOMY Left 08/27/2017   Procedure: LEFT RE-EXCISION OF BREAST LUMPECTOMY ERAS PATHWAY;  Surgeon: Almond Lint, MD;  Location: [facility];  Service: General;  Laterality: Left;   WISDOM TOOTH EXTRACTION     Patient Active Problem List   Diagnosis Date Noted   Family history of early CAD 03/13/2022   Mixed hyperlipidemia 03/13/2022   Ductal carcinoma in situ (DCIS) of breast 03/13/2022   S/P partial mastectomy, bilateral 12/17/2018   Breast asymmetry following reconstructive surgery 12/17/2018   Allergic reaction 05/20/2018   Genetic testing 07/31/2017   Malignant neoplasm of upper-inner quadrant of right breast in female, estrogen receptor positive 07/24/2017   Family history of breast cancer    Family history of prostate cancer     PCP: Milus Height PA   REFERRING PROVIDER: Kathryne Gin NP   REFERRING DIAG:  M54.50 (ICD-10-CM) - Low back pain, unspecified  M85.80 (ICD-10-CM) - Other specified disorders of bone density and structure, unspecified site    THERAPY DIAG:  Muscle spasm of back  Other low back pain  Rationale for Evaluation and Treatment: Rehabilitation  ONSET DATE: has had low back pain for a long time.   SUBJECTIVE:   SUBJECTIVE STATEMENT:  Pt states she has had to really reduced her exercise due to her caregiver duties. She is only able to do 1x/exercise a week. She feels a catch into her R SIJ/L/S region. She is concerned about  how her knee pain is effecting her gait and cause for back pain. Pt does have an upcoming trip with a lot of walking planned. Pt asked about potential taping to help with support while walking at the SIJ and L/S region   Pt leaves 4/30 and will return in early May.     Eval: Patient has prior history of long low back pain.  She had a significant episode on 12/3 to the point where she had a hard time standing.  The severe pain is improved. She continues to go to the gym. She has a dull pain in the right side of her lower back that has stayed with her. She also has a history of right knee pain.  The patient had a session of informal trigger point dry  needling which helped her pain significantly.  She can still feel a constant dull ache on the right side.  PERTINENT HISTORY: Anxiety, cancer of the breast, migraines, Osteopenia,  PAIN:  Are you having pain? No: NPRS scale: 0/10 Pain location: right lower back/ buttock Pain description: aching  Aggravating factors: standing and walking  Relieving factors: rest   PRECAUTIONS: None  WEIGHT BEARING RESTRICTIONS: No  FALLS:  Has patient fallen in last 6 months? No  LIVING ENVIRONMENT: Pain going up steps in her knee OCCUPATION: school OT   Hobbies:   PLOF: Independent  PATIENT GOALS: to have less pain   NEXT MD VISIT:  Nothing scheduled   OBJECTIVE:   DIAGNOSTIC FINDINGS:  Low back x-ray  INDICATION: Lumbar back pain  COMPARISON: None  TECHNIQUE: AP and lateral views of the thoracic spine on 3 films  FINDINGS: No evidence of acute fracture or dislocation. There is mild dextroscoliosis of the lower thoracic/visualized upper lumbar spine measuring approximately 17 degrees from the level of T10/T11 to the level of L2/L3. Vertebral body heights are preserved. Mild to moderate disc space narrowing in the midthoracic spine.   IMPRESSION: 1. No fracture or dislocation. Mild dextroscoliosis of the lower thoracic/visualized upper lumbar spine. 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   PATIENT SURVEYS:  FOTO 57  83 FOTO 3/20  COGNITION: Overall cognitive status: Within functional limits for tasks assessed     SENSATION: No more radiating pain down the R buttock    MUSCLE LENGTH:  POSTURE: No Significant postural limitations  PALPATION: Tender to palpation in the right lateral low back   LOWER EXTREMITY ROM:  Passive ROM Right 3/20 Left 3/20  Hip flexion WNL  WNL  Hip extension    Hip abduction WNL WNL  Hip adduction    Hip internal rotation    Hip external rotation    Knee flexion    Knee extension WNL  WNL  Ankle dorsiflexion     Ankle plantarflexion    Ankle inversion    Ankle eversion     (Blank rows = not tested)  LUMBAR ROM:   Active  A/PROM  3/20  Flexion full  Extension Full p!  Right lateral flexion full  Left lateral flexion full  Right rotation Full  p!  Left rotation Full   (Blank rows = not tested)       TODAY'S TREATMENT:  DATE:  4/22  STM of R lumbar and thoracic paraspinals  KT taping edu and demo for self KT taping of the R L/S, SIJ, and R LE (tape also provided for home)  3/20  R UPA and CPA of T6-L5 STM of R lumbar and thoracic paraspinals  Self pain management with inflammation of pain or future injuries, progression of exercise, referral and transition to personal training    3/7 Reviewed gym exercises: reviewed set up, posture and breathing with each   LF 15 Triceps 25 lbs 3x15  Cybex Row x15 30 lbs   Cybex leg press 90 lbs Seat 5 Back 4  LF leg press 25 lbs   LF hip abduction 60-70 to start    Cable:  Row 10lbs  Shoulder Extension 10 lbs  Biceps curl 5 lbs   2/21  Manual: trigger point release to R glutes and hip rotators; QL release R UPA L3-5 grade IV  Exercises - Standing QL Mobilization with Small Ball Table   2-3 min  hold - Seated Quadratus Lumborum Stretch with Arm Overhead   sets - 3 reps - 30 hold - Seated Thoracic Lumbar Extension with Pectoralis Stretch 2 sets - 10 reps - 5 hold - Open Book Chest Rotation Stretch on Foam 1/2 Roll  2 sets - 10 reps - 5 hold - Piriformis Mobilization on Foam Roll  1 sets - 1 reps - 2-3 min hold 2/14 Trigger Point Dry-Needling  Treatment instructions: Expect mild to moderate muscle soreness. S/S of pneumothorax if dry needled over a lung field, and to seek immediate medical attention should they occur. Patient verbalized understanding of these instructions and education.   Patient  Consent Given: Yes Education (verbally/handout)provided: Yes Muscles treated: R L3-4 paraspinals/multifidi; right gluteal 3x using a .30x50 neefle  Electrical stimulation performed: no Parameters:   n/a Treatment response/outcome: great twitch in gluteal   Manual: trigger point release to gluteal   Row cable  215 10 lbs  with review of breathing and posture  Cable extension 2x15 10 lbs   Nu-step L5 5 min   Reviewed concepts for shoulder strengthening and concepts for core activation    1/30  Trigger Point Dry-Needling  Treatment instructions: Expect mild to moderate muscle soreness. S/S of pneumothorax if dry needled over a lung field, and to seek immediate medical attention should they occur. Patient verbalized understanding of these instructions and education.   Patient Consent Given: Yes Education (verbally/handout)provided: Yes Muscles treated: R L3-4 paraspinals/multifidi Electrical stimulation performed: no Parameters:   n/a Treatment response/outcome: large twitch response elicited at L4, improve soft tissue extensibility  STM R lumbar paraspinals and QL  Seated figure 4 with QL stretch 30s 3x bilat Wall angel 2x10 Seated T/S extension with towel roll 5s 10x Edu regarding neutral spine, abdominal bracing, reducing of exaggerated lumbar extension with OH lifting and squatting, influences of shoulder and T/S mobility on lumbar extension; modifying of class exercises to reduce pain     Previous:  Exercises - Supine Piriformis Stretch with Foot on Ground  - 1 x daily - 7 x weekly - 3 sets - 3 reps - 20 hold - Child's Pose Stretch  - 1 x daily - 7 x weekly - 3 sets - 3 reps - 20 hold - Child's Pose with Sidebending  - 1 x daily - 7 x weekly - 3 sets - 3 reps - 20 hold - Theracane Over Shoulder  - 1 x daily - 7 x weekly -  3 sets - 10 reps - 1-2 min  hold - Standing Glute Med Mobilization with Small Ball on Wall  - 1 x daily - 7 x weekly - 2-3 min  hold   PATIENT  EDUCATION:  Education details: anatomy, exercise progression, acceptable levels of pain,  envelope of function, HEP, POC   Person educated: Patient Education method: Explanation, Demonstration, Tactile cues, Verbal cues, and Handouts Education comprehension: verbalized understanding, returned demonstration, verbal cues required, tactile cues required, and needs further education  HOME EXERCISE PROGRAM: Access Code: [email or web address] URL: [email or web address] Date: 11/06/2022 Prepared by: Lorayne Bender   ASSESSMENT:  CLINICAL IMPRESSION: Pt session today focused on pain management techniques and supportive modality as pt will be traveling and walking longer distances. KT tape applied across the lumbar and SIJ on the R for tactile feedback and mechanical desensitization. Pictures and tape provided for pt. Pt's recent caregiver duties have limited her ability to exercise at this time. Plan for pain management at future sessions and progression towards gym exercise as able. Consider mobility based movements at home if pt schedule continues to limit ability to exercise in the gym setting here at [facility]. Pt would benefit from continued skilled therapy in order to reach goals and maximize functional lumbopelvic strength and ROM for full return to PLOF.   OBJECTIVE IMPAIRMENTS: decreased ROM, decreased strength, increased muscle spasms, and pain.   ACTIVITY LIMITATIONS: sitting, standing, and locomotion level  PARTICIPATION LIMITATIONS: meal prep, cleaning, laundry, shopping, community activity, and occupation  PERSONAL FACTORS: 1-2 comorbidities: right knee pain; osteopenia   are also affecting patient's functional outcome.   REHAB POTENTIAL: Excellent  CLINICAL DECISION MAKING: Stable/uncomplicated  EVALUATION COMPLEXITY: Low   GOALS: Goals reviewed with patient? Yes  SHORT TERM GOALS: Target date: 11/27/2022   Patient will demonstrate full extension without increased lower  back pain Baseline: Goal status: ongoing  2.  Patient will demonstrate full right rotation without increased low back pain Baseline:  Goal status: ongoing  3.  Patient will be independent with basic HEP for stretching and strengthening Baseline:  Goal status: met  LONG TERM GOALS: Target date: 03/27/2023   Patient will return to full gym program without increased pain Baseline:  Goal status: ongoing  2.  Patient will demonstrate full active lumbar motion with IADLs without increased low back pain Baseline:  Goal status: ongoing  3.  Patient will stand and walk community distances without report of increased low back pain Baseline:  Goal status: MET  PLAN:  PT FREQUENCY: 1x/3 wks  PT DURATION: 12 wks (likely D/C by 6)  PLANNED INTERVENTIONS: Therapeutic exercises, Therapeutic activity, Neuromuscular re-education, Gait training, Patient/Family education, Self Care, Joint mobilization, Stair training, Aquatic Therapy, Dry Needling, Cryotherapy, Moist heat, Taping, and Manual therapy  PLAN FOR NEXT SESSION: Manual therapy and TPDN PRN,  develop gym strengthening program, T/S and L/S mobility, Progress core exercises as tolerated  Zebedee Iba PT, DPT 01/29/23 11:44 AM

## 2023-02-18 ENCOUNTER — Encounter: Payer: Self-pay | Admitting: Hematology and Oncology

## 2023-02-19 ENCOUNTER — Ambulatory Visit (HOSPITAL_BASED_OUTPATIENT_CLINIC_OR_DEPARTMENT_OTHER): Payer: BC Managed Care – PPO | Attending: Nurse Practitioner | Admitting: Physical Therapy

## 2023-02-19 ENCOUNTER — Encounter (HOSPITAL_BASED_OUTPATIENT_CLINIC_OR_DEPARTMENT_OTHER): Payer: Self-pay | Admitting: Physical Therapy

## 2023-02-19 ENCOUNTER — Other Ambulatory Visit: Payer: Self-pay | Admitting: *Deleted

## 2023-02-19 DIAGNOSIS — M62830 Muscle spasm of back: Secondary | ICD-10-CM | POA: Insufficient documentation

## 2023-02-19 DIAGNOSIS — M5459 Other low back pain: Secondary | ICD-10-CM

## 2023-02-19 MED ORDER — TAMOXIFEN CITRATE 20 MG PO TABS: 20.0000 mg | ORAL_TABLET | Freq: Every day | ORAL | 2 refills | Status: DC

## 2023-02-19 NOTE — Therapy (Signed)
OUTPATIENT PHYSICAL THERAPY LOWER EXTREMITY Treatment    Patient Name: Marie Castro MRN: [id number] DOB:09/22/1971, 52 y.o., female Today's Date: 02/19/2023  END OF SESSION:  PT End of Session - 02/19/23 1703     Visit Number 9    Number of Visits 14    Date for PT Re-Evaluation 05/20/23    Authorization Type [employer]    PT Start Time 1615    PT Stop Time 1700    PT Time Calculation (min) 45 min    Activity Tolerance Patient tolerated treatment well    Behavior During Therapy WFL for tasks assessed/performed                  Past Medical History:  Diagnosis Date   Anxiety    Cancer (HCC)    breast   Family history of breast cancer    Family history of heart disease    Family history of prostate cancer    History of radiation therapy 10/04/17-11/22/17   left breast 50.4 Gy in 28 fractions, left breast boost 10 Gy in 5 fractions, right breast 50.4 Gy in 28 fractions, right breast boost 12 Gy in 6 fractions   Hypothyroidism    Idiopathic urticaria    Malignant neoplasm of upper-outer quadrant of left female breast (HCC)    Migraine    Overweight    Thyroid disease    Past Surgical History:  Procedure Laterality Date   BREAST BIOPSY Right 07/12/2017   BREAST LUMPECTOMY WITH RADIOACTIVE SEED AND SENTINEL LYMPH NODE BIOPSY Right 08/16/2017   Procedure: RIGHT BREAST LUMPECTOMY WITH RADIOACTIVE SEED AND RIGHT SENTINEL LYMPH NODE BIOPSY;  Surgeon: Almond Lint, MD;  Location: [facility] OR;  Service: General;  Laterality: Right;   BREAST LUMPECTOMY WITH RADIOACTIVE SEED LOCALIZATION Left 08/16/2017   Procedure: LEFT BREAST PARTIAL MASTECTOMY WITH BRACKETED RADIOACTIVE SEED LOCALIZATION;  Surgeon: Almond Lint, MD;  Location: [facility];  Service: General;  Laterality: Left;   DILATION AND CURETTAGE OF UTERUS     KNEE SURGERY     MULTIPLE TOOTH EXTRACTIONS     RE-EXCISION OF BREAST LUMPECTOMY Left 08/27/2017   Procedure: LEFT RE-EXCISION OF BREAST LUMPECTOMY ERAS PATHWAY;  Surgeon:  Almond Lint, MD;  Location: [facility];  Service: General;  Laterality: Left;   WISDOM TOOTH EXTRACTION     Patient Active Problem List   Diagnosis Date Noted   Family history of early CAD 03/13/2022   Mixed hyperlipidemia 03/13/2022   Ductal carcinoma in situ (DCIS) of breast 03/13/2022   S/P partial mastectomy, bilateral 12/17/2018   Breast asymmetry following reconstructive surgery 12/17/2018   Allergic reaction 05/20/2018   Genetic testing 07/31/2017   Malignant neoplasm of upper-inner quadrant of right breast in female, estrogen receptor positive (HCC) 07/24/2017   Family history of breast cancer    Family history of prostate cancer     PCP: Milus Height PA   REFERRING PROVIDER: Kathryne Gin NP   REFERRING DIAG:  M54.50 (ICD-10-CM) - Low back pain, unspecified  M85.80 (ICD-10-CM) - Other specified disorders of bone density and structure, unspecified site    THERAPY DIAG:  Muscle spasm of back  Other low back pain  Rationale for Evaluation and Treatment: Rehabilitation  ONSET DATE: has had low back pain for a long time.   SUBJECTIVE:   SUBJECTIVE STATEMENT:  Pt states that her recent trip went well with less R knee and back pain than expected. Pt was able to exercise 2x/week in class recently again.  Pt still notices R SIJ/lumbar region that does improve. Pt did increased number of squats in class and had DOMS but no increase in lingering pain. Pt reports about 5 hours a sleep a night.   Eval: Patient has prior history of long low back pain.  She had a significant episode on 12/3 to the point where she had a hard time standing.  The severe pain is improved. She continues to go to the gym. She has a dull pain in the right side of her lower back that has stayed with her. She also has a history of right knee pain.  The patient had a session of informal trigger point dry needling which helped her pain significantly.  She can still feel a constant dull ache on  the right side.  PERTINENT HISTORY: Anxiety, cancer of the breast, migraines, Osteopenia,  PAIN:  Are you having pain? No: NPRS scale: 0/10 Pain location: right lower back/ buttock Pain description: aching  Aggravating factors: standing and walking  Relieving factors: rest   PRECAUTIONS: None  WEIGHT BEARING RESTRICTIONS: No  FALLS:  Has patient fallen in last 6 months? No  LIVING ENVIRONMENT: Pain going up steps in her knee OCCUPATION: school OT   Hobbies:   PLOF: Independent  PATIENT GOALS: to have less pain   NEXT MD VISIT:  Nothing scheduled   OBJECTIVE:   DIAGNOSTIC FINDINGS:  Low back x-ray  INDICATION: Lumbar back pain  COMPARISON: None  TECHNIQUE: AP and lateral views of the thoracic spine on 3 films  FINDINGS: No evidence of acute fracture or dislocation. There is mild dextroscoliosis of the lower thoracic/visualized upper lumbar spine measuring approximately 17 degrees from the level of T10/T11 to the level of L2/L3. Vertebral body heights are preserved. Mild to moderate disc space narrowing in the midthoracic spine.   IMPRESSION: 1. No fracture or dislocation. Mild dextroscoliosis of the lower thoracic/visualized upper lumbar spine. 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   PATIENT SURVEYS:  FOTO 57  83 FOTO 3/20   COGNITION: Overall cognitive status: Within functional limits for tasks assessed     SENSATION: No more radiating pain down the R buttock    MUSCLE LENGTH:  POSTURE: No Significant postural limitations  PALPATION: Tender to palpation in the right lateral low back   LOWER EXTREMITY ROM:  Passive ROM Right 5/13 Left 5/13  Hip flexion WNL  WNL  Hip extension    Hip abduction WNL WNL  Hip adduction    Hip internal rotation    Hip external rotation    Knee flexion    Knee extension WNL  WNL  Ankle dorsiflexion    Ankle plantarflexion    Ankle inversion    Ankle eversion     (Blank rows = not  tested)   LUMBAR ROM:   Active  A/PROM  5/13  Flexion full  Extension Full p!  Right lateral flexion full  Left lateral flexion full  Right rotation Full   Left rotation Full   (Blank rows = not tested)   Lunge: R side increase in frontal plane deviation; pain with returning   Squat: lumbar reverse at bottom position; L weight shift  RDL/lifting: hip hinge position with lumbar lordosis  Joint mobility: Pain with R rotation and extension combined motion   SIJ: R ant innominate rotation Corrected with SIJ MET  TODAY'S TREATMENT:  DATE:   5/13  R innominate rotation SIJ MET with shotgun- appears to correct rotation; improvement in ext pain but not combined  R rot and ext  Exercises - RDL/Half Deadlift  - 1 x daily - 2 x weekly - 3 sets - 8 reps - Standing Sidebends  - 1 x daily - 2 x weekly - 2 sets - 10 reps - Jefferson Curl  - 1 x daily - 2 x weekly - 3 sets - 8 reps - Lateral Step Down  - 1 x daily - 2 x weekly - 2 sets - 10 reps  Pain management; recovery, acceptable pain with exercise/movement; technique and form with exercise class 4/22  STM of R lumbar and thoracic paraspinals  KT taping edu and demo for self KT taping of the R L/S, SIJ, and R LE (tape also provided for home)  3/20  R UPA and CPA of T6-L5 STM of R lumbar and thoracic paraspinals  Self pain management with inflammation of pain or future injuries, progression of exercise, referral and transition to personal training    3/7 Reviewed gym exercises: reviewed set up, posture and breathing with each   LF 15 Triceps 25 lbs 3x15  Cybex Row x15 30 lbs   Cybex leg press 90 lbs Seat 5 Back 4  LF leg press 25 lbs   LF hip abduction 60-70 to start    Cable:  Row 10lbs  Shoulder Extension 10 lbs  Biceps curl 5 lbs    PATIENT EDUCATION:  Education details: anatomy,  exercise progression, acceptable levels of pain,  envelope of function, HEP, POC   Person educated: Patient Education method: Explanation, Demonstration, Tactile cues, Verbal cues, and Handouts Education comprehension: verbalized understanding, returned demonstration, verbal cues required, tactile cues required, and needs further education  HOME EXERCISE PROGRAM: Access Code: [email or web address] URL: [email or web address] Date: 11/06/2022 Prepared by: Lorayne Bender   ASSESSMENT:  CLINICAL IMPRESSION: Pt presents today with minimally irritable pain following period of time away from exercise/gym based agg factors. Pt session focused on prioritizing progressive loading to the lumbar region. Following SIJ MET, pt did have improved tolerance to ext but rotation and ext still reproduced pain. Pt able to load across the SIJ, lumbar spine, and hips in all planes without pain. Pt was weaker into unilateral LE exercise with increased fatigue with reduce repetition. Continue with progressive lumbar loading as tolerated. Pt would benefit from continued skilled therapy in order to reach goals and maximize functional lumbopelvic strength and ROM for full return to PLOF.   OBJECTIVE IMPAIRMENTS: decreased ROM, decreased strength, increased muscle spasms, and pain.   ACTIVITY LIMITATIONS: sitting, standing, and locomotion level  PARTICIPATION LIMITATIONS: meal prep, cleaning, laundry, shopping, community activity, and occupation  PERSONAL FACTORS: 1-2 comorbidities: right knee pain; osteopenia   are also affecting patient's functional outcome.   REHAB POTENTIAL: Excellent  CLINICAL DECISION MAKING: Stable/uncomplicated  EVALUATION COMPLEXITY: Low   GOALS: Goals reviewed with patient? Yes  SHORT TERM GOALS: Target date: 11/27/2022   Patient will demonstrate full extension without increased lower back pain Baseline: Goal status: ongoing  2.  Patient will demonstrate full right rotation  without increased low back pain Baseline:  Goal status: ongoing  3.  Patient will be independent with basic HEP for stretching and strengthening Baseline:  Goal status: met  LONG TERM GOALS: Target date: 03/27/2023   Patient will return to full gym program without increased pain Baseline:  Goal status: ongoing  2.  Patient will demonstrate  full active lumbar motion with IADLs without increased low back pain Baseline:  Goal status: ongoing  3.  Patient will stand and walk community distances without report of increased low back pain Baseline:  Goal status: MET  PLAN:  PT FREQUENCY: 1x/3 wks  PT DURATION: 12 wks (likely D/C by 6)  PLANNED INTERVENTIONS: Therapeutic exercises, Therapeutic activity, Neuromuscular re-education, Gait training, Patient/Family education, Self Care, Joint mobilization, Stair training, Aquatic Therapy, Dry Needling, Cryotherapy, Moist heat, Taping, and Manual therapy  PLAN FOR NEXT SESSION: Manual therapy and TPDN PRN,  develop gym strengthening program, T/S and L/S mobility, Progress core exercises as tolerated  Zebedee Iba PT, DPT 02/19/23 8:25 PM

## 2023-03-12 ENCOUNTER — Ambulatory Visit (HOSPITAL_BASED_OUTPATIENT_CLINIC_OR_DEPARTMENT_OTHER): Payer: BC Managed Care – PPO | Attending: Nurse Practitioner | Admitting: Physical Therapy

## 2023-03-12 ENCOUNTER — Encounter (HOSPITAL_BASED_OUTPATIENT_CLINIC_OR_DEPARTMENT_OTHER): Payer: Self-pay | Admitting: Physical Therapy

## 2023-03-12 DIAGNOSIS — M5459 Other low back pain: Secondary | ICD-10-CM | POA: Diagnosis present

## 2023-03-12 DIAGNOSIS — M62830 Muscle spasm of back: Secondary | ICD-10-CM | POA: Insufficient documentation

## 2023-03-12 NOTE — Therapy (Signed)
OUTPATIENT PHYSICAL THERAPY LOWER EXTREMITY Treatment    Patient Name: Marie Castro MRN: [id number] DOB:[birth year], 52 y.o., female Today's Date: 03/12/2023  END OF SESSION:  PT End of Session - 03/12/23 1519     Visit Number 10   PN/re-cert does previous visit   Number of Visits 14    Date for PT Re-Evaluation 05/20/23    Authorization Type BCBS    PT Start Time 1446    PT Stop Time 1515    PT Time Calculation (min) 29 min    Activity Tolerance Patient tolerated treatment well    Behavior During Therapy WFL for tasks assessed/performed                   Past Medical History:  Diagnosis Date   Anxiety    Cancer (HCC)    breast   Family history of breast cancer    Family history of heart disease    Family history of prostate cancer    History of radiation therapy 10/04/17-11/22/17   left breast 50.4 Gy in 28 fractions, left breast boost 10 Gy in 5 fractions, right breast 50.4 Gy in 28 fractions, right breast boost 12 Gy in 6 fractions   Hypothyroidism    Idiopathic urticaria    Malignant neoplasm of upper-outer quadrant of left female breast (HCC)    Migraine    Overweight    Thyroid disease    Past Surgical History:  Procedure Laterality Date   BREAST BIOPSY Right 07/12/2017   BREAST LUMPECTOMY WITH RADIOACTIVE SEED AND SENTINEL LYMPH NODE BIOPSY Right 08/16/2017   Procedure: RIGHT BREAST LUMPECTOMY WITH RADIOACTIVE SEED AND RIGHT SENTINEL LYMPH NODE BIOPSY;  Surgeon: Almond Lint, MD;  Location: [facility];  Service: General;  Laterality: Right;   BREAST LUMPECTOMY WITH RADIOACTIVE SEED LOCALIZATION Left 08/16/2017   Procedure: LEFT BREAST PARTIAL MASTECTOMY WITH BRACKETED RADIOACTIVE SEED LOCALIZATION;  Surgeon: Almond Lint, MD;  Location: [facility];  Service: General;  Laterality: Left;   DILATION AND CURETTAGE OF UTERUS     KNEE SURGERY     MULTIPLE TOOTH EXTRACTIONS     RE-EXCISION OF BREAST LUMPECTOMY Left 08/27/2017   Procedure: LEFT RE-EXCISION OF BREAST  LUMPECTOMY ERAS PATHWAY;  Surgeon: Almond Lint, MD;  Location: [facility];  Service: General;  Laterality: Left;   WISDOM TOOTH EXTRACTION     Patient Active Problem List   Diagnosis Date Noted   Family history of early CAD 03/13/2022   Mixed hyperlipidemia 03/13/2022   Ductal carcinoma in situ (DCIS) of breast 03/13/2022   S/P partial mastectomy, bilateral 12/17/2018   Breast asymmetry following reconstructive surgery 12/17/2018   Allergic reaction 05/20/2018   Genetic testing 07/31/2017   Malignant neoplasm of upper-inner quadrant of right breast in female, estrogen receptor positive (HCC) 07/24/2017   Family history of breast cancer    Family history of prostate cancer     PCP: Milus Height PA   REFERRING PROVIDER: Kathryne Gin NP   REFERRING DIAG:  M54.50 (ICD-10-CM) - Low back pain, unspecified  M85.80 (ICD-10-CM) - Other specified disorders of bone density and structure, unspecified site    THERAPY DIAG:  Muscle spasm of back  Other low back pain  Rationale for Evaluation and Treatment: Rehabilitation  ONSET DATE: has had low back pain for a long time.   SUBJECTIVE:   SUBJECTIVE STATEMENT:  Pt will occasionally have pain but it is less frequent. She is modifying exercises in class as needed. Pt states the  back pain is progressing well.  Eval: Patient has prior history of long low back pain.  She had a significant episode on 12/3 to the point where she had a hard time standing.  The severe pain is improved. She continues to go to the gym. She has a dull pain in the right side of her lower back that has stayed with her. She also has a history of right knee pain.  The patient had a session of informal trigger point dry needling which helped her pain significantly.  She can still feel a constant dull ache on the right side.  PERTINENT HISTORY: Anxiety, cancer of the breast, migraines, Osteopenia,  PAIN:  Are you having pain? No: NPRS scale: 0/10 Pain  location: right lower back/ buttock Pain description: aching  Aggravating factors: standing and walking  Relieving factors: rest   PRECAUTIONS: None  WEIGHT BEARING RESTRICTIONS: No  FALLS:  Has patient fallen in last 6 months? No  LIVING ENVIRONMENT: Pain going up steps in her knee OCCUPATION: school OT   Hobbies:   PLOF: Independent  PATIENT GOALS: to have less pain   NEXT MD VISIT:  Nothing scheduled   OBJECTIVE:   DIAGNOSTIC FINDINGS:  Low back x-ray  INDICATION: Lumbar back pain  COMPARISON: None  TECHNIQUE: AP and lateral views of the thoracic spine on 3 films  FINDINGS: No evidence of acute fracture or dislocation. There is mild dextroscoliosis of the lower thoracic/visualized upper lumbar spine measuring approximately 17 degrees from the level of T10/T11 to the level of L2/L3. Vertebral body heights are preserved. Mild to moderate disc space narrowing in the midthoracic spine.   IMPRESSION: 1. No fracture or dislocation. Mild dextroscoliosis of the lower thoracic/visualized upper lumbar spine. 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   PATIENT SURVEYS:  FOTO 57  83 FOTO 3/20   COGNITION: Overall cognitive status: Within functional limits for tasks assessed     SENSATION: No more radiating pain down the R buttock    MUSCLE LENGTH:  POSTURE: No Significant postural limitations  PALPATION: Tender to palpation in the right lateral low back   LOWER EXTREMITY ROM:  Passive ROM Right 5/13 Left 5/13  Hip flexion WNL  WNL  Hip extension    Hip abduction WNL WNL  Hip adduction    Hip internal rotation    Hip external rotation    Knee flexion    Knee extension WNL  WNL  Ankle dorsiflexion    Ankle plantarflexion    Ankle inversion    Ankle eversion     (Blank rows = not tested)   LUMBAR ROM:   Active  A/PROM  5/13  Flexion full  Extension Full p!  Right lateral flexion full  Left lateral flexion full  Right  rotation Full   Left rotation Full   (Blank rows = not tested)   Lunge: R side increase in frontal plane deviation; pain with returning   Squat: lumbar reverse at bottom position; L weight shift  RDL/lifting: hip hinge position with lumbar lordosis  Joint mobility: Pain with R rotation and extension combined motion   SIJ: R ant innominate rotation Corrected with SIJ MET  TODAY'S TREATMENT:  DATE:  6/3  3lb rotational RDL 8x each side  Goblet squat with press out 3lbs 2x5  Suitcase march 15lbs 2x20 5lb weighted sidebends 2x10 Blue TB paloff with rotation 2x10 ea     5/13  R innominate rotation SIJ MET with shotgun- appears to correct rotation; improvement in ext pain but not combined  R rot and ext  Exercises - RDL/Half Deadlift  - 1 x daily - 2 x weekly - 3 sets - 8 reps - Standing Sidebends  - 1 x daily - 2 x weekly - 2 sets - 10 reps - Jefferson Curl  - 1 x daily - 2 x weekly - 3 sets - 8 reps - Lateral Step Down  - 1 x daily - 2 x weekly - 2 sets - 10 reps  Pain management; recovery, acceptable pain with exercise/movement; technique and form with exercise class 4/22  STM of R lumbar and thoracic paraspinals  KT taping edu and demo for self KT taping of the R L/S, SIJ, and R LE (tape also provided for home)  3/20  R UPA and CPA of T6-L5 STM of R lumbar and thoracic paraspinals  Self pain management with inflammation of pain or future injuries, progression of exercise, referral and transition to personal training    3/7 Reviewed gym exercises: reviewed set up, posture and breathing with each   LF 15 Triceps 25 lbs 3x15  Cybex Row x15 30 lbs   Cybex leg press 90 lbs Seat 5 Back 4  LF leg press 25 lbs   LF hip abduction 60-70 to start    Cable:  Row 10lbs  Shoulder Extension 10 lbs  Biceps curl 5 lbs    PATIENT EDUCATION:   Education details: anatomy, exercise progression, acceptable levels of pain,  envelope of function, HEP, POC   Person educated: Patient Education method: Explanation, Demonstration, Tactile cues, Verbal cues, and Handouts Education comprehension: verbalized understanding, returned demonstration, verbal cues required, tactile cues required, and needs further education  HOME EXERCISE PROGRAM: Access Code: [email or web address] URL: [email or web address] Date: 11/06/2022 Prepared by: Lorayne Bender   ASSESSMENT:  CLINICAL IMPRESSION: Pt with good tolerance to progression of lumbopelvic strengthening and modifications to exercise class. Pt is still limited to lifting </= 5 lbs with squatting type motions but pt able to increase ROM with exercise and add in static holds at bottom position. Offset and SL exercise progressed as well without pain in addition to anti-rotational movement at higher intensity. If no further exacerbation in pain and continued improvement, plan for D/C at next visit. Pt would benefit from continued skilled therapy in order to reach goals and maximize functional lumbopelvic strength and ROM for full return to PLOF.   OBJECTIVE IMPAIRMENTS: decreased ROM, decreased strength, increased muscle spasms, and pain.   ACTIVITY LIMITATIONS: sitting, standing, and locomotion level  PARTICIPATION LIMITATIONS: meal prep, cleaning, laundry, shopping, community activity, and occupation  PERSONAL FACTORS: 1-2 comorbidities: right knee pain; osteopenia   are also affecting patient's functional outcome.   REHAB POTENTIAL: Excellent  CLINICAL DECISION MAKING: Stable/uncomplicated  EVALUATION COMPLEXITY: Low   GOALS: Goals reviewed with patient? Yes  SHORT TERM GOALS: Target date: 11/27/2022   Patient will demonstrate full extension without increased lower back pain Baseline: Goal status: ongoing  2.  Patient will demonstrate full right rotation without increased low back  pain Baseline:  Goal status: ongoing  3.  Patient will be independent with basic HEP for stretching and strengthening Baseline:  Goal status: met  LONG TERM GOALS: Target date: 03/27/2023   Patient will return to full gym program without increased pain Baseline:  Goal status: ongoing  2.  Patient will demonstrate full active lumbar motion with IADLs without increased low back pain Baseline:  Goal status: ongoing  3.  Patient will stand and walk community distances without report of increased low back pain Baseline:  Goal status: MET  PLAN:  PT FREQUENCY: 1x/3 wks  PT DURATION: 12 wks (likely D/C by 6)  PLANNED INTERVENTIONS: Therapeutic exercises, Therapeutic activity, Neuromuscular re-education, Gait training, Patient/Family education, Self Care, Joint mobilization, Stair training, Aquatic Therapy, Dry Needling, Cryotherapy, Moist heat, Taping, and Manual therapy  PLAN FOR NEXT SESSION: Manual therapy and TPDN PRN,  develop gym strengthening program, T/S and L/S mobility, Progress core exercises as tolerated  Zebedee Iba PT, DPT 03/12/23 3:26 PM

## 2023-04-11 ENCOUNTER — Encounter (HOSPITAL_BASED_OUTPATIENT_CLINIC_OR_DEPARTMENT_OTHER): Payer: Self-pay | Admitting: Physical Therapy

## 2023-04-11 ENCOUNTER — Ambulatory Visit (HOSPITAL_BASED_OUTPATIENT_CLINIC_OR_DEPARTMENT_OTHER): Payer: BC Managed Care – PPO | Attending: Nurse Practitioner | Admitting: Physical Therapy

## 2023-04-11 DIAGNOSIS — M62830 Muscle spasm of back: Secondary | ICD-10-CM | POA: Diagnosis present

## 2023-04-11 DIAGNOSIS — M5459 Other low back pain: Secondary | ICD-10-CM | POA: Diagnosis present

## 2023-04-11 NOTE — Therapy (Signed)
OUTPATIENT PHYSICAL THERAPY LOWER EXTREMITY Treatment   PHYSICAL THERAPY DISCHARGE SUMMARY  Visits from Start of Care: 11  Plan: Patient agrees to discharge.  Patient goals were met. Patient is being discharged due to meeting the stated rehab goals.         Patient Name: Marie Castro MRN: [id number] DOB:Oct 18, 1970, 52 y.o., female Today's Date: 04/11/2023  END OF SESSION:  PT End of Session - 04/11/23 1509     Visit Number 11    Number of Visits 14    Date for PT Re-Evaluation 05/20/23    Authorization Type BCBS    PT Start Time 1445    PT Stop Time 1508    PT Time Calculation (min) 23 min    Activity Tolerance Patient tolerated treatment well    Behavior During Therapy WFL for tasks assessed/performed                    Past Medical History:  Diagnosis Date   Anxiety    Cancer (HCC)    breast   Family history of breast cancer    Family history of heart disease    Family history of prostate cancer    History of radiation therapy 10/04/17-11/22/17   left breast 50.4 Gy in 28 fractions, left breast boost 10 Gy in 5 fractions, right breast 50.4 Gy in 28 fractions, right breast boost 12 Gy in 6 fractions   Hypothyroidism    Idiopathic urticaria    Malignant neoplasm of upper-outer quadrant of left female breast (HCC)    Migraine    Overweight    Thyroid disease    Past Surgical History:  Procedure Laterality Date   BREAST BIOPSY Right 07/12/2017   BREAST LUMPECTOMY WITH RADIOACTIVE SEED AND SENTINEL LYMPH NODE BIOPSY Right 08/16/2017   Procedure: RIGHT BREAST LUMPECTOMY WITH RADIOACTIVE SEED AND RIGHT SENTINEL LYMPH NODE BIOPSY;  Surgeon: Almond Lint, MD;  Location: [facility];  Service: General;  Laterality: Right;   BREAST LUMPECTOMY WITH RADIOACTIVE SEED LOCALIZATION Left 08/16/2017   Procedure: LEFT BREAST PARTIAL MASTECTOMY WITH BRACKETED RADIOACTIVE SEED LOCALIZATION;  Surgeon: Almond Lint, MD;  Location: [facility];  Service: General;  Laterality: Left;    DILATION AND CURETTAGE OF UTERUS     KNEE SURGERY     MULTIPLE TOOTH EXTRACTIONS     RE-EXCISION OF BREAST LUMPECTOMY Left 08/27/2017   Procedure: LEFT RE-EXCISION OF BREAST LUMPECTOMY ERAS PATHWAY;  Surgeon: Almond Lint, MD;  Location: [facility];  Service: General;  Laterality: Left;   WISDOM TOOTH EXTRACTION     Patient Active Problem List   Diagnosis Date Noted   Family history of early CAD 03/13/2022   Mixed hyperlipidemia 03/13/2022   Ductal carcinoma in situ (DCIS) of breast 03/13/2022   S/P partial mastectomy, bilateral 12/17/2018   Breast asymmetry following reconstructive surgery 12/17/2018   Allergic reaction 05/20/2018   Genetic testing 07/31/2017   Malignant neoplasm of upper-inner quadrant of right breast in female, estrogen receptor positive (HCC) 07/24/2017   Family history of breast cancer    Family history of prostate cancer     PCP: Milus Height PA   REFERRING PROVIDER: Kathryne Gin NP   REFERRING DIAG:  M54.50 (ICD-10-CM) - Low back pain, unspecified  M85.80 (ICD-10-CM) - Other specified disorders of bone density and structure, unspecified site    THERAPY DIAG:  Muscle spasm of back  Other low back pain  Rationale for Evaluation and Treatment: Rehabilitation  ONSET DATE: has had  low back pain for a long time.   SUBJECTIVE:   SUBJECTIVE STATEMENT:  Pt states that the back no longer hurts all the time. She will occasionally have pain after exercise that will last only a day. Pt able to participate in her exercise classes with minor modifications to weight. She does not need to modify movements as much anymore.   Eval: Patient has prior history of long low back pain.  She had a significant episode on 12/3 to the point where she had a hard time standing.  The severe pain is improved. She continues to go to the gym. She has a dull pain in the right side of her lower back that has stayed with her. She also has a history of right knee pain.   The patient had a session of informal trigger point dry needling which helped her pain significantly.  She can still feel a constant dull ache on the right side.  PERTINENT HISTORY: Anxiety, cancer of the breast, migraines, Osteopenia,  PAIN:  Are you having pain? No: NPRS scale: 0/10 Pain location: right lower back/ buttock Pain description: aching  Aggravating factors: standing and walking  Relieving factors: rest   PRECAUTIONS: None  WEIGHT BEARING RESTRICTIONS: No  FALLS:  Has patient fallen in last 6 months? No  LIVING ENVIRONMENT: Pain going up steps in her knee OCCUPATION: school OT   Hobbies:   PLOF: Independent  PATIENT GOALS: to have less pain   NEXT MD VISIT:  Nothing scheduled   OBJECTIVE:   DIAGNOSTIC FINDINGS:  Low back x-ray  INDICATION: Lumbar back pain  COMPARISON: None  TECHNIQUE: AP and lateral views of the thoracic spine on 3 films  FINDINGS: No evidence of acute fracture or dislocation. There is mild dextroscoliosis of the lower thoracic/visualized upper lumbar spine measuring approximately 17 degrees from the level of T10/T11 to the level of L2/L3. Vertebral body heights are preserved. Mild to moderate disc space narrowing in the midthoracic spine.   IMPRESSION: 1. No fracture or dislocation. Mild dextroscoliosis of the lower thoracic/visualized upper lumbar spine. 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   PATIENT SURVEYS:  FOTO 57  83 FOTO 3/20   COGNITION: Overall cognitive status: Within functional limits for tasks assessed     SENSATION: No more radiating pain down the R buttock    MUSCLE LENGTH:  POSTURE: No Significant postural limitations  LOWER EXTREMITY ROM:  Passive ROM Right 7/3 Left 7/3  Hip flexion WNL  WNL  Hip extension    Hip abduction WNL WNL  Hip adduction    Hip internal rotation    Hip external rotation    Knee flexion    Knee extension WNL  WNL  Ankle dorsiflexion    Ankle  plantarflexion    Ankle inversion    Ankle eversion     (Blank rows = not tested)   LUMBAR ROM:   Active  A/PROM  7/3  Flexion full  Extension Full   Right lateral flexion full  Left lateral flexion full  Right rotation Full   Left rotation Full   (Blank rows = not tested)   Lunge: WFL  Squat: WFL  RDL/lifting: hip hinge position with lumbar lordosis  Joint mobility: WFL  SIJ: R ant innominate rotation Corrected with SIJ MET  TODAY'S TREATMENT:  DATE:  7/3  Program Notes towel roll under heel of palm   Exercises - RDL/Half Deadlift  - 1 x daily - 2 x weekly - 3 sets - 8 reps - Standing Sidebends  - 1 x daily - 2 x weekly - 2 sets - 10 reps - Jefferson Curl  - 1 x daily - 2 x weekly - 3 sets - 5 reps - Lateral Step Down  - 1 x daily - 2 x weekly - 2 sets - 8 reps - Kettlebell Suitcase Carry  - 1 x daily - 2 x weekly - 3 sets - 10 reps - Standing Anti-Rotation Press with Anchored Resistance  - 1 x daily - 2 x weekly - 2 sets - 10 reps - Prone Plank Elbows on Ball  - 1 x daily - 2 x weekly - 2 sets - 20 reps - Bird Dog  - 1 x daily - 2 x weekly - 3 sets - 10 reps    6/3  3lb rotational RDL 8x each side  Goblet squat with press out 3lbs 2x5  Suitcase march 15lbs 2x20 5lb weighted sidebends 2x10 Blue TB paloff with rotation 2x10 ea     5/13  R innominate rotation SIJ MET with shotgun- appears to correct rotation; improvement in ext pain but not combined  R rot and ext  Exercises - RDL/Half Deadlift  - 1 x daily - 2 x weekly - 3 sets - 8 reps - Standing Sidebends  - 1 x daily - 2 x weekly - 2 sets - 10 reps - Jefferson Curl  - 1 x daily - 2 x weekly - 3 sets - 8 reps - Lateral Step Down  - 1 x daily - 2 x weekly - 2 sets - 10 reps  Pain management; recovery, acceptable pain with exercise/movement; technique and form with exercise  class 4/22  STM of R lumbar and thoracic paraspinals  KT taping edu and demo for self KT taping of the R L/S, SIJ, and R LE (tape also provided for home)  3/20  R UPA and CPA of T6-L5 STM of R lumbar and thoracic paraspinals  Self pain management with inflammation of pain or future injuries, progression of exercise, referral and transition to personal training    3/7 Reviewed gym exercises: reviewed set up, posture and breathing with each   LF 15 Triceps 25 lbs 3x15  Cybex Row x15 30 lbs   Cybex leg press 90 lbs Seat 5 Back 4  LF leg press 25 lbs   LF hip abduction 60-70 to start    Cable:  Row 10lbs  Shoulder Extension 10 lbs  Biceps curl 5 lbs    PATIENT EDUCATION:  Education details: anatomy, exercise progression, acceptable levels of pain,  envelope of function, HEP, POC   Person educated: Patient Education method: Explanation, Demonstration, Tactile cues, Verbal cues, and Handouts Education comprehension: verbalized understanding, returned demonstration, verbal cues required, tactile cues required, and needs further education  HOME EXERCISE PROGRAM: Access Code: [email or web address] URL: [email or web address] Date: 11/06/2022 Prepared by: Lorayne Bender   ASSESSMENT:  CLINICAL IMPRESSION: Pt has met all PT goals at this time and has returned to normal exercise without increase in back pain. Pt aware of self recovery strategies, alternative exercise choices, and self progression/regression of exercise. Pt advised on exercise program tapering, if needed as well as additional exercises for incorporation into class. No further needs for therapy. LBP D/C episode.    OBJECTIVE IMPAIRMENTS: decreased ROM,  decreased strength, increased muscle spasms, and pain.   ACTIVITY LIMITATIONS: sitting, standing, and locomotion level  PARTICIPATION LIMITATIONS: meal prep, cleaning, laundry, shopping, community activity, and occupation  PERSONAL FACTORS: 1-2  comorbidities: right knee pain; osteopenia   are also affecting patient's functional outcome.   REHAB POTENTIAL: Excellent  CLINICAL DECISION MAKING: Stable/uncomplicated  EVALUATION COMPLEXITY: Low   GOALS: Goals reviewed with patient? Yes  SHORT TERM GOALS: Target date: 11/27/2022   Patient will demonstrate full extension without increased lower back pain Baseline: Goal status: MET  2.  Patient will demonstrate full right rotation without increased low back pain Baseline:  Goal status: MET  3.  Patient will be independent with basic HEP for stretching and strengthening Baseline:  Goal status: met  LONG TERM GOALS: Target date: 03/27/2023   Patient will return to full gym program without increased pain Baseline:  Goal status: MET  2.  Patient will demonstrate full active lumbar motion with IADLs without increased low back pain Baseline:  Goal status: MET  3.  Patient will stand and walk community distances without report of increased low back pain Baseline:  Goal status: MET  PLAN:  PT FREQUENCY: 1x/3 wks  PT DURATION: 12 wks (likely D/C by 6)  PLANNED INTERVENTIONS: Therapeutic exercises, Therapeutic activity, Neuromuscular re-education, Gait training, Patient/Family education, Self Care, Joint mobilization, Stair training, Aquatic Therapy, Dry Needling, Cryotherapy, Moist heat, Taping, and Manual therapy   Zebedee Iba PT, DPT 04/11/23 3:19 PM

## 2023-04-13 ENCOUNTER — Encounter (HOSPITAL_BASED_OUTPATIENT_CLINIC_OR_DEPARTMENT_OTHER): Payer: BC Managed Care – PPO | Admitting: Physical Therapy

## 2023-07-13 ENCOUNTER — Inpatient Hospital Stay: Payer: BC Managed Care – PPO | Attending: Hematology and Oncology | Admitting: Hematology and Oncology

## 2023-07-13 ENCOUNTER — Inpatient Hospital Stay: Payer: BC Managed Care – PPO

## 2023-07-13 VITALS — BP 106/65 | HR 86 | Temp 97.9°F | Resp 17 | Ht 65.0 in | Wt 171.7 lb

## 2023-07-13 DIAGNOSIS — M25471 Effusion, right ankle: Secondary | ICD-10-CM | POA: Insufficient documentation

## 2023-07-13 DIAGNOSIS — Z79810 Long term (current) use of selective estrogen receptor modulators (SERMs): Secondary | ICD-10-CM | POA: Diagnosis not present

## 2023-07-13 DIAGNOSIS — C50211 Malignant neoplasm of upper-inner quadrant of right female breast: Secondary | ICD-10-CM | POA: Insufficient documentation

## 2023-07-13 DIAGNOSIS — Z923 Personal history of irradiation: Secondary | ICD-10-CM | POA: Insufficient documentation

## 2023-07-13 DIAGNOSIS — R682 Dry mouth, unspecified: Secondary | ICD-10-CM | POA: Insufficient documentation

## 2023-07-13 DIAGNOSIS — M25472 Effusion, left ankle: Secondary | ICD-10-CM | POA: Diagnosis not present

## 2023-07-13 DIAGNOSIS — Z8042 Family history of malignant neoplasm of prostate: Secondary | ICD-10-CM | POA: Diagnosis not present

## 2023-07-13 DIAGNOSIS — Z170 Estrogen receptor positive status [ER+]: Secondary | ICD-10-CM | POA: Insufficient documentation

## 2023-07-13 DIAGNOSIS — Z1721 Progesterone receptor positive status: Secondary | ICD-10-CM | POA: Insufficient documentation

## 2023-07-13 DIAGNOSIS — Z803 Family history of malignant neoplasm of breast: Secondary | ICD-10-CM | POA: Diagnosis not present

## 2023-07-13 LAB — CBC WITH DIFFERENTIAL/PLATELET
Abs Immature Granulocytes: 0.03 10*3/uL (ref 0.00–0.07)
Basophils Absolute: 0.1 10*3/uL (ref 0.0–0.1)
Basophils Relative: 2 %
Eosinophils Absolute: 0.8 10*3/uL — ABNORMAL HIGH (ref 0.0–0.5)
Eosinophils Relative: 11 %
HCT: 39.8 % (ref 36.0–46.0)
Hemoglobin: 13.3 g/dL (ref 12.0–15.0)
Immature Granulocytes: 0 %
Lymphocytes Relative: 27 %
Lymphs Abs: 1.9 10*3/uL (ref 0.7–4.0)
MCH: 29.3 pg (ref 26.0–34.0)
MCHC: 33.4 g/dL (ref 30.0–36.0)
MCV: 87.7 fL (ref 80.0–100.0)
Monocytes Absolute: 0.6 10*3/uL (ref 0.1–1.0)
Monocytes Relative: 8 %
Neutro Abs: 3.7 10*3/uL (ref 1.7–7.7)
Neutrophils Relative %: 52 %
Platelets: 283 10*3/uL (ref 150–400)
RBC: 4.54 MIL/uL (ref 3.87–5.11)
RDW: 12.5 % (ref 11.5–15.5)
WBC: 7.1 10*3/uL (ref 4.0–10.5)
nRBC: 0 % (ref 0.0–0.2)

## 2023-07-13 LAB — CMP (CANCER CENTER ONLY)
ALT: 11 U/L (ref 0–44)
AST: 17 U/L (ref 15–41)
Albumin: 3.3 g/dL — ABNORMAL LOW (ref 3.5–5.0)
Alkaline Phosphatase: 33 U/L — ABNORMAL LOW (ref 38–126)
Anion gap: 2 — ABNORMAL LOW (ref 5–15)
BUN: 21 mg/dL — ABNORMAL HIGH (ref 6–20)
CO2: 28 mmol/L (ref 22–32)
Calcium: 8.6 mg/dL — ABNORMAL LOW (ref 8.9–10.3)
Chloride: 107 mmol/L (ref 98–111)
Creatinine: 0.94 mg/dL (ref 0.44–1.00)
GFR, Estimated: >60 mL/min (ref >60)
Glucose, Bld: 89 mg/dL (ref 70–99)
Potassium: 4.5 mmol/L (ref 3.5–5.1)
Sodium: 137 mmol/L (ref 135–145)
Total Bilirubin: 0.2 mg/dL — ABNORMAL LOW (ref 0.3–1.2)
Total Protein: 5.1 g/dL — ABNORMAL LOW (ref 6.5–8.1)

## 2023-07-13 NOTE — Progress Notes (Signed)
[facility]  Telephone:([phone]) [phone] [phone]     ID: Marie Castro DOB: 1971/02/27  MR#: [id number]  NFA#:[id number]  Patient Care Team: Marie Height, PA as PCP - General (Nurse Practitioner) Marie Constant, MD as PCP - Cardiology (Cardiology) Castro, Marie Hue, MD (Inactive) as Consulting Physician (Oncology) Marie Lint, MD as Consulting Physician (General Surgery) Marie Blackbird, MD as Consulting Physician (Radiation Oncology) Marie Leber, PA-C as Physician Assistant (Obstetrics and Gynecology) Castro, Marie Lofts, PA-C as Physician Assistant (Dermatology) Marie Stanford, MD as Referring Physician (Allergy and Immunology) OTHER MD:  CHIEF COMPLAINT: Invasive lobular breast cancer, estrogen receptor positive  CURRENT TREATMENT: tamoxifen  INTERVAL HISTORY:  Marie Castro returns today for follow-up of her estrogen receptor positive breast cancer.  She continues on tamoxifen for antiestrogen therapy.    The patient, a 52 year old woman with a history of breast cancer, presents for a routine follow-up. She has been on tamoxifen since spring of 2019 and is expected to complete her treatment in early 2026. The patient reports experiencing menopausal symptoms, including joint aches and dry mouth, which she is curious to see if they will improve once she stops tamoxifen. She also mentions a history of chronic urticaria and migraines, which she believes were triggered by the stress of her cancer diagnosis and treatment.  The patient has been managing her weight through a program at [facility] and has lost 30 pounds in the past year. She is currently transitioning to a maintenance program. She also reports some swelling in her ankles, which she has noticed more with inactivity. She has a history of knee issues and has been told she is a candidate for a partial knee replacement.  The patient also mentions that she is considering breast  reconstruction once her treatment with tamoxifen is completed. She had a consultation with a plastic surgeon in the past and is considering making this a goal for the future.  Rest of the pertinent 10 point ROS reviewed and negative.  REVIEW OF SYSTEMS: Detailed review of systems today was otherwise noncontributory   COVID 19 VACCINATION STATUS: Pfizer x2 and 1 booster.   HISTORY OF CURRENT ILLNESS: From the original intake note:  Marie Castro had bilateral screening mammography at [facility] 07/06/2017, showing a new 1.5 cm oval mass in the right breast upper inner quadrant, a possible development asymmetry in the left breast upper outer quadrant, and a 0.3 cm area of calcifications in the left breast upper outer quadrant. On 07/11/2017 the patient underwent left diagnostic mammography with tomography and bilateral breast ultrasonography. The left breast mammography showed the breast density to be category C. In the upper-outer quadrant of the left breast there was a 0.8 cm oval mass which by ultrasound was a benign complicated cyst. There was also a 0.3 cm area of grouped calcifications in the left breast upper outer quadrant.  In the right breast, ultrasonography showed an irregular mass in the upper inner quadrant adjacent to a benign 1.5 cm simple cyst.  On 07/12/2017 she underwent bilateral biopsies. On the right there was an invasive lobular carcinoma, grade 1 or 2, estrogen receptor 80% positive with moderate staining intensity, progesterone receptor 95% positive with strong staining intensity, with an MIB-1 of 1%, and no HER-2 amplification, the signals ratio being 1.12 and the number per cell 1.85. ([id number])  On the left the biopsy showed atypical lobular hyperplasia.  Both axillae were sonographically benign  The patient's subsequent history is as detailed below.   PAST  MEDICAL HISTORY: Past Medical History:  Diagnosis Date   Anxiety    Cancer (HCC)    breast   Family history of  breast cancer    Family history of heart disease    Family history of prostate cancer    History of radiation therapy 10/04/17-11/22/17   left breast 50.4 Gy in 28 fractions, left breast boost 10 Gy in 5 fractions, right breast 50.4 Gy in 28 fractions, right breast boost 12 Gy in 6 fractions   Hypothyroidism    Idiopathic urticaria    Malignant neoplasm of upper-outer quadrant of left female breast (HCC)    Migraine    Overweight    Thyroid disease     PAST SURGICAL HISTORY: Past Surgical History:  Procedure Laterality Date   BREAST BIOPSY Right 07/12/2017   BREAST LUMPECTOMY WITH RADIOACTIVE SEED AND SENTINEL LYMPH NODE BIOPSY Right 08/16/2017   Procedure: RIGHT BREAST LUMPECTOMY WITH RADIOACTIVE SEED AND RIGHT SENTINEL LYMPH NODE BIOPSY;  Surgeon: Marie Lint, MD;  Location: [facility] OR;  Service: General;  Laterality: Right;   BREAST LUMPECTOMY WITH RADIOACTIVE SEED LOCALIZATION Left 08/16/2017   Procedure: LEFT BREAST PARTIAL MASTECTOMY WITH BRACKETED RADIOACTIVE SEED LOCALIZATION;  Surgeon: Marie Lint, MD;  Location: [facility] OR;  Service: General;  Laterality: Left;   DILATION AND CURETTAGE OF UTERUS     KNEE SURGERY     MULTIPLE TOOTH EXTRACTIONS     RE-EXCISION OF BREAST LUMPECTOMY Left 08/27/2017   Procedure: LEFT RE-EXCISION OF BREAST LUMPECTOMY ERAS PATHWAY;  Surgeon: Marie Lint, MD;  Location: [facility];  Service: General;  Laterality: Left;   WISDOM TOOTH EXTRACTION      FAMILY HISTORY Family History  Problem Relation Age of Onset   Cancer Mother        SKIN AND BREAST   Heart disease Mother    Heart disease Father    Hyperlipidemia Father    Depression Father    Cancer Maternal Grandmother        BREAST   Melanoma Maternal Grandmother        toe   Cancer Maternal Grandfather        PROSTATE   Irritable bowel syndrome Paternal Grandmother    Heart disease Paternal Grandfather    Breast cancer Maternal Aunt 72   Breast cancer Maternal Aunt         dx in her 49s  The patient's father is 21 year old and her mother 33 year old as of October 2018. The patient has one sister, no brothers. The patient's mother and mother's mother both had breast cancer in their 18s. A maternal aunt had breast cancer at age 71, and another at age 33. The patient's mother has had genetics testing, which was negative. Please also consulted the detailed genetics pedigree in Epic   GYNECOLOGIC HISTORY:  Patient's last menstrual period was 04/08/2018. Menarche age 68, first live birth age 8, she is still premenopausal, with regular periods lasting approximately 5 days, of which 3 are heavy. She used oral contraceptives approximately 2 years with no complications and also had a subcutaneous depot contraceptive briefly.  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 Her husband Industrial/product designer ("Chip") is a Research officer, political party for [employer]. Their son, Trace, is 10 as of September 2022 and is going to be going to college studying business.  And daughter Amil Amen is 65 as of September 2022 the patient  attends the [employer]    ADVANCED DIRECTIVES: In the absence of any documentation to the contrary, the patient's spouse is their HCPOA.   HEALTH MAINTENANCE: Social History   Tobacco Use   Smoking status: Never   Smokeless tobacco: Never  Vaping Use   Vaping status: Never Used  Substance Use Topics   Alcohol use: No   Drug use: No     Colonoscopy: Never  PAP:  Bone density: Never   Allergies  Allergen Reactions   Oxycodone Other (See Comments)    Pt states could not sleep at all, was up 24 hrs   Celecoxib     Other reaction(s): GI Intolerance, GI Upset (intolerance)   Latex Rash    Pt states rubber bands from her braces causes ulcers in her mouth    Current Outpatient Medications  Medication Sig Dispense Refill   [employer] D3  25 MCG (1000 UT) capsule TAKE 1 CAPSULE BY MOUTH EVERY DAY 120 capsule 4   eletriptan (RELPAX) 40 MG tablet Take 1 tablet (40 mg total) by mouth as needed for migraine or headache. May repeat in 2 hours if headache persists or recurs.     EPINEPHrine 0.3 mg/0.3 mL IJ SOAJ injection as needed.     Galcanezumab-gnlm 100 MG/ML SOSY Inject into the skin every 30 (thirty) days. 3.08 mL    ibuprofen (ADVIL,MOTRIN) 200 MG tablet Take 400-600 mg by mouth daily as needed for headache or moderate pain.     levocetirizine (XYZAL) 5 MG tablet Take by mouth.     levothyroxine (SYNTHROID, LEVOTHROID) 100 MCG tablet Take 100 mcg by mouth daily before breakfast.     lidocaine (LIDODERM) 5 % Place 1 patch onto the skin daily. Remove & Discard patch within 12 hours or as directed by MD 30 patch 0   LORazepam (ATIVAN) 0.5 MG tablet Take 0.5 mg by mouth 2 (two) times daily as needed for anxiety.     meloxicam (MOBIC) 15 MG tablet Take 15 mg by mouth daily.     methocarbamol (ROBAXIN) 500 MG tablet Take 1 tablet (500 mg total) by mouth 2 (two) times daily. 20 tablet 0   methylPREDNISolone (MEDROL DOSEPAK) 4 MG TBPK tablet Use as directed 1 each 0   omalizumab (XOLAIR) 150 MG injection Inject 150 mg into the skin every 28 (twenty-eight) days.     ondansetron (ZOFRAN-ODT) 8 MG disintegrating tablet TAKE 1 TABLET (8 MG TOTAL) BY MOUTH EVERY 8 (EIGHT) HOURS AS NEEDED FOR NAUSEA OR VOMITING. 18 tablet 3   tamoxifen (NOLVADEX) 20 MG tablet Take 1 tablet (20 mg total) by mouth daily. 90 tablet 2   venlafaxine XR (EFFEXOR-XR) 75 MG 24 hr capsule TAKE 1 CAPSULE BY MOUTH DAILY WITH BREAKFAST. 90 capsule 2   No current facility-administered medications for this visit.    OBJECTIVE: White woman in no acute distress  Vitals:   07/13/23 1408  BP: 106/65  Pulse: 86  Resp: 17  Temp: 97.9 F (36.6 C)  SpO2: 99%      Body mass index is 28.57 kg/m.   Wt Readings from Last 3 Encounters:  07/13/23 171 lb 11.2 oz (77.9 kg)   07/11/22 186 lb 14.4 oz (84.8 kg)  03/13/22 192 lb (87.1 kg)      ECOG FS:1 - Symptomatic but completely ambulatory  General appearance: Alert, oriented and in no acute distress Breast exam: Status post bilateral lumpectomies.  No concern for masses or regional adenopathy.  LAB RESULTS:  CMP  Component Value Date/Time   NA 137 07/11/2022 1411   K 4.3 07/11/2022 1411   CL 104 07/11/2022 1411   CO2 31 07/11/2022 1411   GLUCOSE 91 07/11/2022 1411   BUN 16 07/11/2022 1411   CREATININE 0.86 07/11/2022 1411   CALCIUM 9.1 07/11/2022 1411   PROT 6.4 (L) 07/11/2022 1411   ALBUMIN 4.1 07/11/2022 1411   AST 26 07/11/2022 1411   ALT 36 07/11/2022 1411   ALKPHOS 42 07/11/2022 1411   BILITOT 0.3 07/11/2022 1411   GFRNONAA >60 07/11/2022 1411   GFRAA >60 06/08/2020 1607   GFRAA >60 11/13/2017 1512    No results found for: "TOTALPROTELP", "ALBUMINELP", "A1GS", "A2GS", "BETS", "BETA2SER", "GAMS", "MSPIKE", "SPEI"  No results found for: "KPAFRELGTCHN", "LAMBDASER", "KAPLAMBRATIO"  Lab Results  Component Value Date   WBC 5.7 07/11/2022   NEUTROABS 2.9 07/11/2022   HGB 12.9 07/11/2022   HCT 37.8 07/11/2022   MCV 85.3 07/11/2022   PLT 298 07/11/2022      Chemistry      Component Value Date/Time   NA 137 07/11/2022 1411   K 4.3 07/11/2022 1411   CL 104 07/11/2022 1411   CO2 31 07/11/2022 1411   BUN 16 07/11/2022 1411   CREATININE 0.86 07/11/2022 1411      Component Value Date/Time   CALCIUM 9.1 07/11/2022 1411   ALKPHOS 42 07/11/2022 1411   AST 26 07/11/2022 1411   ALT 36 07/11/2022 1411   BILITOT 0.3 07/11/2022 1411       No results found for: "LABCA2"  No components found for: "IONGEX528"  No results for input(s): "INR" in the last 168 hours.  No results found for: "LABCA2"  No results found for: "UXL244"  No results found for: "CAN125"  No results found for: "CAN153"  No results found for: "CA2729"  No components found for: "HGQUANT"  No results  found for: "CEA1", "CEA" / No results found for: "CEA1", "CEA"   No results found for: "AFPTUMOR"  No results found for: "CHROMOGRNA"  No results found for: "HGBA", "HGBA2QUANT", "HGBFQUANT", "HGBSQUAN" (Hemoglobinopathy evaluation)   No results found for: "LDH"  No results found for: "IRON", "TIBC", "IRONPCTSAT" (Iron and TIBC)  No results found for: "FERRITIN"  Urinalysis No results found for: "COLORURINE", "APPEARANCEUR", "LABSPEC", "PHURINE", "GLUCOSEU", "HGBUR", "BILIRUBINUR", "KETONESUR", "PROTEINUR", "UROBILINOGEN", "NITRITE", "LEUKOCYTESUR"   STUDIES: No results found.   ELIGIBLE FOR AVAILABLE RESEARCH PROTOCOL: no  ASSESSMENT: 52 y.o. [place] woman status post right breast upper inner quadrant biopsy 07/11/2017 for a clinical T1c N0, stage IA invasive lobular carcinoma, E-cadherin negative, grade 1 or 2, estrogen and progesterone receptor positive, HER-2 nonamplified, with an MIB-1 of 1%  (1) biopsy of left breast upper outer quadrant calcifications 07/11/2017 showed atypical lobular hyperplasia  (a) breast MRI 07/19/2017 shows 2 additional areas of concern in the left breast, with MRI guided biopsy 07/25/2017 showing ductal carcinoma in situ x2, low-grade, estrogen and progesterone receptor strongly positive.  (2) genetics testing 07/27/2017 through the STAT Breast cancer panel offered by [facility] found no deleterious mutations in ATM, BRCA1, BRCA2, CDH1, CHEK2, PALB2, PTEN, STK11 and TP53.   Reflexed to the Multi-Gene Panel offered by [facility] finding no deleterious mutations in ALK, APC, ATM, AXIN2,BAP1,  BARD1, BLM, BMPR1A, BRCA1, BRCA2, BRIP1, CASR, CDC73, CDH1, CDK4, CDKN1B, CDKN1C, CDKN2A (p14ARF), CDKN2A (p16INK4a), CEBPA, CHEK2, CTNNA1, DICER1, DIS3L2, EGFR (c.2369C>T, p.Thr790Met variant only), EPCAM (Deletion/duplication testing only), FH, FLCN, GATA2, GPC3, GREM1 (Promoter region deletion/duplication testing only), HOXB13 (c.251G>A, p.Gly84Glu), HRAS, KIT, MAX,  MEN1, MET, MITF (c.952G>A, p.Glu318Lys variant only), MLH1, MSH2, MSH3, MSH6, MUTYH, NBN, NF1, NF2, NTHL1, PALB2, PDGFRA, PHOX2B, PMS2, POLD1, POLE, POT1, PRKAR1A, PTCH1, PTEN, RAD50, RAD51C, RAD51D, RB1, RECQL4, RET, RUNX1, SDHAF2, SDHA (sequence changes only), SDHB, SDHC, SDHD, SMAD4, SMARCA4, SMARCB1, SMARCE1, STK11, SUFU, TERT, TERT, TMEM127, TP53, TSC1, TSC2, VHL, WRN and WT1.   (3) tamoxifen started neoadjuvantly 07/24/2017  (a) held July 2019 secondary to hives, resumed November 2019  [(b) Bx of hives = urticaria, treated with omalizumab]  (4) status post right lumpectomy and sentinel lymph node sampling 08/16/2017 for a pT1c pN0, stage IA invasive lobular carcinoma, grade 2, total of 3 sentinel nodes removed  (5) status post left lumpectomy 08/16/2017 for ductal carcinoma in situ, low-grade, with focally positive margins, Tis NX.  (a) additional surgery for margin clearance 08/27/2017 was successful ([id number])  (6) Oncotype DX score of 12 predicts a 10-year risk of recurrence outside the breast of 8% if the patient's only systemic therapy is tamoxifen for 5 years.  It also predicts no benefit from chemotherapy.  (7) adjuvant radiation 10/04/2018 to 11/22/2017: 50.4 Gy to the left breast with a 10 Gy boost as well as  50.4 Gy to the right breast with a 12 Gy boost  (8) to continue tamoxifen a minimum of 5 years, then consider 2 years additional aromatase inhibitor treatment  (a) on 06/08/2020 FSH was 44.4 with estradiol 60.3 (perimenopausal).   PLAN:  Estrogen Receptor Positive Breast Cancer On Tamoxifen with concerns about potential side effects including joint aches and cognitive fog. Discussed the possibility of these symptoms improving after discontinuation of Tamoxifen. Recent mammogram at [facility] was normal. -Continue Tamoxifen until approximately March 2026 (7 years total). -Consider consultation with plastic surgery for reconstruction after completion of  Tamoxifen.  Menopause Experiencing symptoms potentially related to menopause including joint aches and cognitive fog. Discussed the potential for improvement of these symptoms after the body adjusts to lower estrogen levels. -Encouraged to continue cognitive exercises and group exercise.  Dry Mouth Possible side effect of Effexor. -Consider dose adjustment or alternative medication if symptoms persist.  Swelling in Ankles Noted on physical exam, potentially related to decreased activity. -Encouraged to increase activity as tolerated.  Follow-up -Order blood count and metabolic panel today. -Next appointment in approximately 1 year (October 2025).  Total time spent: 30 minutes  *Total Encounter Time as defined by the Centers for Medicare and Medicaid Services includes, in addition to the face-to-face time of a patient visit (documented in the note above) non-face-to-face time: obtaining and reviewing outside history, ordering and reviewing medications, tests or procedures, care coordination (communications with other health care professionals or caregivers) and documentation in the medical record.

## 2023-08-22 ENCOUNTER — Other Ambulatory Visit: Payer: Self-pay | Admitting: Hematology and Oncology

## 2023-10-11 ENCOUNTER — Ambulatory Visit: Payer: 59 | Admitting: Family Medicine

## 2023-10-11 ENCOUNTER — Encounter: Payer: Self-pay | Admitting: Family Medicine

## 2023-10-11 VITALS — BP 119/61 | Ht 66.0 in | Wt 170.0 lb

## 2023-10-11 DIAGNOSIS — M2141 Flat foot [pes planus] (acquired), right foot: Secondary | ICD-10-CM | POA: Diagnosis not present

## 2023-10-11 DIAGNOSIS — M2142 Flat foot [pes planus] (acquired), left foot: Secondary | ICD-10-CM

## 2023-10-11 DIAGNOSIS — M170 Bilateral primary osteoarthritis of knee: Secondary | ICD-10-CM | POA: Diagnosis not present

## 2023-10-11 DIAGNOSIS — M21612 Bunion of left foot: Secondary | ICD-10-CM | POA: Diagnosis not present

## 2023-10-11 DIAGNOSIS — M21619 Bunion of unspecified foot: Secondary | ICD-10-CM | POA: Diagnosis not present

## 2023-10-11 DIAGNOSIS — M21611 Bunion of right foot: Secondary | ICD-10-CM

## 2023-10-11 NOTE — Progress Notes (Signed)
 PCP: Marie Reagin, PA  Subjective:   HPI: Patient is a 53 y.o. female here for custom orthotics.  Patient reports she's had custom orthotics in the past and these have helped her with her medial knee pain. She is scheduled to have a partial right knee replacement in March and would like to have these prior to that to hopefully help with some of the pain.  Past Medical History:  Diagnosis Date   Anxiety    Cancer [facility])    breast   Family history of breast cancer    Family history of heart disease    Family history of prostate cancer    History of radiation therapy 10/04/17-11/22/17   left breast 50.4 Gy in 28 fractions, left breast boost 10 Gy in 5 fractions, right breast 50.4 Gy in 28 fractions, right breast boost 12 Gy in 6 fractions   Hypothyroidism    Idiopathic urticaria    Malignant neoplasm of upper-outer quadrant of left female breast (HCC)    Migraine    Overweight    Thyroid disease     Current Outpatient Medications on File Prior to Visit  Medication Sig Dispense Refill   [employer]S D3 25 MCG (1000 UT) capsule TAKE 1 CAPSULE BY MOUTH EVERY DAY 120 capsule 4   eletriptan (RELPAX) 40 MG tablet Take 1 tablet (40 mg total) by mouth as needed for migraine or headache. May repeat in 2 hours if headache persists or recurs.     EPINEPHrine  0.3 mg/0.3 mL IJ SOAJ injection as needed.     Galcanezumab-gnlm 100 MG/ML SOSY Inject into the skin every 30 (thirty) days. 3.08 mL    ibuprofen (ADVIL,MOTRIN) 200 MG tablet Take 400-600 mg by mouth daily as needed for headache or moderate pain.     levocetirizine (XYZAL) 5 MG tablet Take by mouth.     levothyroxine (SYNTHROID, LEVOTHROID) 100 MCG tablet Take 100 mcg by mouth daily before breakfast.     lidocaine  (LIDODERM ) 5 % Place 1 patch onto the skin daily. Remove & Discard patch within 12 hours or as directed by MD 30 patch 0   LORazepam  (ATIVAN ) 0.5 MG tablet Take 0.5 mg by mouth 2 (two) times daily as needed for anxiety.     meloxicam  (MOBIC) 15 MG tablet Take 15 mg by mouth daily.     methocarbamol  (ROBAXIN ) 500 MG tablet Take 1 tablet (500 mg total) by mouth 2 (two) times daily. 20 tablet 0   methylPREDNISolone  (MEDROL  DOSEPAK) 4 MG TBPK tablet Use as directed 1 each 0   omalizumab (XOLAIR) 150 MG injection Inject 150 mg into the skin every 28 (twenty-eight) days.     ondansetron  (ZOFRAN -ODT) 8 MG disintegrating tablet TAKE 1 TABLET (8 MG TOTAL) BY MOUTH EVERY 8 (EIGHT) HOURS AS NEEDED FOR NAUSEA OR VOMITING. 18 tablet 3   tamoxifen  (NOLVADEX ) 20 MG tablet TAKE 1 TABLET BY MOUTH EVERY DAY 90 tablet 2   venlafaxine  XR (EFFEXOR -XR) 75 MG 24 hr capsule TAKE 1 CAPSULE BY MOUTH DAILY WITH BREAKFAST. 90 capsule 2   No current facility-administered medications on file prior to visit.    Past Surgical History:  Procedure Laterality Date   BREAST BIOPSY Right 07/12/2017   BREAST LUMPECTOMY WITH RADIOACTIVE SEED AND SENTINEL LYMPH NODE BIOPSY Right 08/16/2017   Procedure: RIGHT BREAST LUMPECTOMY WITH RADIOACTIVE SEED AND RIGHT SENTINEL LYMPH NODE BIOPSY;  Surgeon: Aron Shoulders, MD;  Location: [facility] OR;  Service: General;  Laterality: Right;   BREAST LUMPECTOMY WITH  RADIOACTIVE SEED LOCALIZATION Left 08/16/2017   Procedure: LEFT BREAST PARTIAL MASTECTOMY WITH BRACKETED RADIOACTIVE SEED LOCALIZATION;  Surgeon: Aron Shoulders, MD;  Location: [facility] OR;  Service: General;  Laterality: Left;   DILATION AND CURETTAGE OF UTERUS     KNEE SURGERY     MULTIPLE TOOTH EXTRACTIONS     RE-EXCISION OF BREAST LUMPECTOMY Left 08/27/2017   Procedure: LEFT RE-EXCISION OF BREAST LUMPECTOMY ERAS PATHWAY;  Surgeon: Aron Shoulders, MD;  Location: [facility];  Service: General;  Laterality: Left;   WISDOM TOOTH EXTRACTION      Allergies  Allergen Reactions   Oxycodone  Other (See Comments)    Pt states could not sleep at all, was up 24 hrs   Celecoxib      Other reaction(s): GI Intolerance, GI Upset (intolerance)   Latex Rash    Pt states  rubber bands from her braces causes ulcers in her mouth    BP 119/61   Ht 5' 6 (1.676 m)   Wt 170 lb (77.1 kg)   LMP 04/08/2018   BMI 27.44 kg/m       No data to display              No data to display              Objective:  Physical Exam:  Gen: NAD, comfortable in exam room  Bilateral feet/ankles: Notable hallux valgus with bunions bilaterally, mod overpronation.  No swelling, bruising.  Mild transverse arch collapse. Full range of motion ankles.  No hallux rigidus. No tenderness to palpation NV intact distally. Leg lengths equal.  Assessment & Plan:  1. 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 Hallux valgus with bunion formation.  Long arch support should help with her medial knee pain decreasing rotational and compressive forces through the knee.  We discussed how to break these in, what to prompt calling us  for adjustment.  F/u prn otherwise.  Patient was fitted for a : standard, cushioned, semi-rigid orthotic. The orthotic was heated and afterward the patient stood on the orthotic blank positioned on the orthotic stand. The patient was positioned in subtalar neutral position and 10 degrees of ankle dorsiflexion in a weight bearing stance. After completion of molding, a stable base was applied to the orthotic blank. The blank was ground to a stable position for weight bearing. Size: 8 fit and run Base: none Posting: none Additional orthotic padding: none

## 2023-10-18 ENCOUNTER — Encounter: Payer: Self-pay | Admitting: Hematology and Oncology

## 2023-10-18 ENCOUNTER — Telehealth: Payer: Self-pay | Admitting: *Deleted

## 2023-10-18 NOTE — Telephone Encounter (Signed)
 Thickening of endometrium Received: Today Okey Sluder C sent to [facility] Phone Number: [phone]   Yesterday I had post menopausal bleeding and today I went and had procedures at my OB office. The ultrasound showed my endometrium measuring at 19.7 mm thickness. This is not normal they said. They did an endometrium biopsy as well as a pap smear and we are awaiting results. They said one side effect of tamoxifen  can be endometrial cancer. Should I continue taking to #MIN at this time   This RN called pt and spoke with her per above.  She states her last true period was 2019 and diagnosed in 2018.  She has been tamoxifen  for almost 7 years  I forgot why Dr Layla wanted me to do an additional 2 years- but I think I am done taking it  This RN discussed above as well as to currently hold taking further tamoxifen  at this time.  Appt made for pt with MD for later this month post expected return of path report to discuss breast cancer and completion of therapy recommendations.  Pt is agreement to plan.

## 2023-10-19 ENCOUNTER — Telehealth: Payer: Self-pay | Admitting: *Deleted

## 2023-10-19 NOTE — Telephone Encounter (Signed)
 No entry

## 2023-10-31 ENCOUNTER — Inpatient Hospital Stay: Payer: 59 | Attending: Hematology and Oncology | Admitting: Hematology and Oncology

## 2023-10-31 VITALS — BP 106/73 | HR 98 | Temp 97.8°F | Resp 18 | Ht 66.0 in | Wt 174.9 lb

## 2023-10-31 DIAGNOSIS — Z923 Personal history of irradiation: Secondary | ICD-10-CM | POA: Insufficient documentation

## 2023-10-31 DIAGNOSIS — Z170 Estrogen receptor positive status [ER+]: Secondary | ICD-10-CM | POA: Diagnosis not present

## 2023-10-31 DIAGNOSIS — Z79899 Other long term (current) drug therapy: Secondary | ICD-10-CM | POA: Insufficient documentation

## 2023-10-31 DIAGNOSIS — Z1721 Progesterone receptor positive status: Secondary | ICD-10-CM | POA: Insufficient documentation

## 2023-10-31 DIAGNOSIS — Z803 Family history of malignant neoplasm of breast: Secondary | ICD-10-CM | POA: Diagnosis not present

## 2023-10-31 DIAGNOSIS — Z79810 Long term (current) use of selective estrogen receptor modulators (SERMs): Secondary | ICD-10-CM | POA: Diagnosis not present

## 2023-10-31 DIAGNOSIS — Z8042 Family history of malignant neoplasm of prostate: Secondary | ICD-10-CM | POA: Insufficient documentation

## 2023-10-31 DIAGNOSIS — Z791 Long term (current) use of non-steroidal anti-inflammatories (NSAID): Secondary | ICD-10-CM | POA: Diagnosis not present

## 2023-10-31 DIAGNOSIS — C50211 Malignant neoplasm of upper-inner quadrant of right female breast: Secondary | ICD-10-CM | POA: Insufficient documentation

## 2023-10-31 DIAGNOSIS — E039 Hypothyroidism, unspecified: Secondary | ICD-10-CM | POA: Diagnosis not present

## 2023-10-31 DIAGNOSIS — C50412 Malignant neoplasm of upper-outer quadrant of left female breast: Secondary | ICD-10-CM | POA: Insufficient documentation

## 2023-10-31 NOTE — Progress Notes (Signed)
[facility]  Telephone:([phone]) [phone] Fax:([phone]) [phone]     ID: Marie Castro DOB: [birth year]  MR#: [id number]  GUY#:[id number]  Patient Care Team: Milus Height, PA as PCP - General (Nurse Practitioner) Christell Constant, MD as PCP - Cardiology (Cardiology) Magrinat, Valentino Hue, MD (Inactive) as Consulting Physician (Oncology) Almond Lint, MD as Consulting Physician (General Surgery) Antony Blackbird, MD as Consulting Physician (Radiation Oncology) Henreitta Leber, PA-C as Physician Assistant (Obstetrics and Gynecology) Register, Joice Lofts, PA-C as Physician Assistant (Dermatology) Eileen Stanford, MD as Referring Physician (Allergy and Immunology) OTHER MD:  CHIEF COMPLAINT: Invasive lobular breast cancer, estrogen receptor positive  CURRENT TREATMENT: tamoxifen  INTERVAL HISTORY:  Marie Castro returns today for follow-up of her estrogen receptor positive breast cancer.   The patient, with a history of breast cancer, presents with concerns about postmenopausal bleeding. The bleeding, which lasted for a week, has since stopped. The patient reports that the bleeding was heavy and accompanied by clotting, causing significant anxiety. The patient has been on tamoxifen for over six years, but has recently stopped due to concerns about its potential effects on the endometrium. The patient has been reading studies suggesting that tamoxifen can cause a buildup of the endometrium, which can be associated with cancer.The patient is scheduled for a partial knee replacement in May due to mobility issues. She wants to proceed with surveillance at this time.   REVIEW OF SYSTEMS: Detailed review of systems today was otherwise noncontributory   COVID 19 VACCINATION STATUS: Pfizer x2 and 1 booster.   HISTORY OF CURRENT ILLNESS: From the original intake note:  Marie Castro had bilateral screening mammography at [facility] 07/06/2017, showing a new 1.5 cm oval mass in the right breast upper inner quadrant,  a possible development asymmetry in the left breast upper outer quadrant, and a 0.3 cm area of calcifications in the left breast upper outer quadrant. On 07/11/2017 the patient underwent left diagnostic mammography with tomography and bilateral breast ultrasonography. The left breast mammography showed the breast density to be category C. In the upper-outer quadrant of the left breast there was a 0.8 cm oval mass which by ultrasound was a benign complicated cyst. There was also a 0.3 cm area of grouped calcifications in the left breast upper outer quadrant.  In the right breast, ultrasonography showed an irregular mass in the upper inner quadrant adjacent to a benign 1.5 cm simple cyst.  On 07/12/2017 she underwent bilateral biopsies. On the right there was an invasive lobular carcinoma, grade 1 or 2, estrogen receptor 80% positive with moderate staining intensity, progesterone receptor 95% positive with strong staining intensity, with an MIB-1 of 1%, and no HER-2 amplification, the signals ratio being 1.12 and the number per cell 1.85. ([id number])  On the left the biopsy showed atypical lobular hyperplasia.  Both axillae were sonographically benign  The patient's subsequent history is as detailed below.   PAST MEDICAL HISTORY: Past Medical History:  Diagnosis Date   Anxiety    Cancer (HCC)    breast   Family history of breast cancer    Family history of heart disease    Family history of prostate cancer    History of radiation therapy 10/04/17-11/22/17   left breast 50.4 Gy in 28 fractions, left breast boost 10 Gy in 5 fractions, right breast 50.4 Gy in 28 fractions, right breast boost 12 Gy in 6 fractions   Hypothyroidism    Idiopathic urticaria    Malignant neoplasm of upper-outer quadrant of left female  breast (HCC)    Migraine    Overweight    Thyroid disease     PAST SURGICAL HISTORY: Past Surgical History:  Procedure Laterality Date   BREAST BIOPSY Right 07/12/2017    BREAST LUMPECTOMY WITH RADIOACTIVE SEED AND SENTINEL LYMPH NODE BIOPSY Right 08/16/2017   Procedure: RIGHT BREAST LUMPECTOMY WITH RADIOACTIVE SEED AND RIGHT SENTINEL LYMPH NODE BIOPSY;  Surgeon: Almond Lint, MD;  Location: [facility] OR;  Service: General;  Laterality: Right;   BREAST LUMPECTOMY WITH RADIOACTIVE SEED LOCALIZATION Left 08/16/2017   Procedure: LEFT BREAST PARTIAL MASTECTOMY WITH BRACKETED RADIOACTIVE SEED LOCALIZATION;  Surgeon: Almond Lint, MD;  Location: [facility] OR;  Service: General;  Laterality: Left;   DILATION AND CURETTAGE OF UTERUS     KNEE SURGERY     MULTIPLE TOOTH EXTRACTIONS     RE-EXCISION OF BREAST LUMPECTOMY Left 08/27/2017   Procedure: LEFT RE-EXCISION OF BREAST LUMPECTOMY ERAS PATHWAY;  Surgeon: Almond Lint, MD;  Location: [facility];  Service: General;  Laterality: Left;   WISDOM TOOTH EXTRACTION      FAMILY HISTORY Family History  Problem Relation Age of Onset   Cancer Mother        SKIN AND BREAST   Heart disease Mother    Heart disease Father    Hyperlipidemia Father    Depression Father    Cancer Maternal Grandmother        BREAST   Melanoma Maternal Grandmother        toe   Cancer Maternal Grandfather        PROSTATE   Irritable bowel syndrome Paternal Grandmother    Heart disease Paternal Grandfather    Breast cancer Maternal Aunt 72   Breast cancer Maternal Aunt        dx in her 40s  The patient's father is 65 year old and her mother 49 year old as of October 2018. The patient has one sister, no brothers. The patient's mother and mother's mother both had breast cancer in their 63s. A maternal aunt had breast cancer at age 72, and another at age 22. The patient's mother has had genetics testing, which was negative. Please also consulted the detailed genetics pedigree in Epic   GYNECOLOGIC HISTORY:  Patient's last menstrual period was 04/08/2018. Menarche age 6, first live birth age 67, she is still premenopausal, with regular periods  lasting approximately 5 days, of which 3 are heavy. She used oral contraceptives approximately 2 years with no complications and also had a subcutaneous depot contraceptive briefly.  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 Her husband Industrial/product designer ("Chip") is a Research officer, political party for [employer]. Their son, Trace, is 46 as of September 2022 and is going to be going to college studying business.  And daughter Amil Amen is 35 as of September 2022 the patient attends the [employer]    ADVANCED DIRECTIVES: In the absence of any documentation to the contrary, the patient's spouse is their HCPOA.   HEALTH MAINTENANCE: Social History   Tobacco Use   Smoking status: Never   Smokeless tobacco: Never  Vaping Use   Vaping status: Never Used  Substance Use Topics   Alcohol use: No   Drug use: No     Colonoscopy: Never  PAP:  Bone density: Never   Allergies  Allergen Reactions   Oxycodone Other (See Comments)    Pt states could not sleep at all, was  up 24 hrs   Celecoxib     Other reaction(s): GI Intolerance, GI Upset (intolerance)   Latex Rash    Pt states rubber bands from her braces causes ulcers in her mouth    Current Outpatient Medications  Medication Sig Dispense Refill   [facility] D3 25 MCG (1000 UT) capsule TAKE 1 CAPSULE BY MOUTH EVERY DAY 120 capsule 4   eletriptan (RELPAX) 40 MG tablet Take 1 tablet (40 mg total) by mouth as needed for migraine or headache. May repeat in 2 hours if headache persists or recurs.     EPINEPHrine 0.3 mg/0.3 mL IJ SOAJ injection as needed.     Galcanezumab-gnlm 100 MG/ML SOSY Inject into the skin every 30 (thirty) days. 3.08 mL    ibuprofen (ADVIL,MOTRIN) 200 MG tablet Take 400-600 mg by mouth daily as needed for headache or moderate pain.     levocetirizine (XYZAL) 5 MG tablet Take by mouth.     levothyroxine  (SYNTHROID, LEVOTHROID) 100 MCG tablet Take 100 mcg by mouth daily before breakfast.     lidocaine (LIDODERM) 5 % Place 1 patch onto the skin daily. Remove & Discard patch within 12 hours or as directed by MD 30 patch 0   LORazepam (ATIVAN) 0.5 MG tablet Take 0.5 mg by mouth 2 (two) times daily as needed for anxiety.     meloxicam (MOBIC) 15 MG tablet Take 15 mg by mouth daily.     methocarbamol (ROBAXIN) 500 MG tablet Take 1 tablet (500 mg total) by mouth 2 (two) times daily. 20 tablet 0   methylPREDNISolone (MEDROL DOSEPAK) 4 MG TBPK tablet Use as directed 1 each 0   omalizumab (XOLAIR) 150 MG injection Inject 150 mg into the skin every 28 (twenty-eight) days.     ondansetron (ZOFRAN-ODT) 8 MG disintegrating tablet TAKE 1 TABLET (8 MG TOTAL) BY MOUTH EVERY 8 (EIGHT) HOURS AS NEEDED FOR NAUSEA OR VOMITING. 18 tablet 3   tamoxifen (NOLVADEX) 20 MG tablet TAKE 1 TABLET BY MOUTH EVERY DAY 90 tablet 2   venlafaxine XR (EFFEXOR-XR) 75 MG 24 hr capsule TAKE 1 CAPSULE BY MOUTH DAILY WITH BREAKFAST. 90 capsule 2   No current facility-administered medications for this visit.    OBJECTIVE: White woman in no acute distress  Vitals:   10/31/23 1457  BP: 106/73  Pulse: 98  Resp: 18  Temp: 97.8 F (36.6 C)  SpO2: 100%      Body mass index is 28.23 kg/m.   Wt Readings from Last 3 Encounters:  10/31/23 174 lb 14.4 oz (79.3 kg)  10/11/23 170 lb (77.1 kg)  07/13/23 171 lb 11.2 oz (77.9 kg)      ECOG FS:1 - Symptomatic but completely ambulatory  General appearance: Alert, oriented and in no acute distress Breast exam: Palpable fluctuating lump left breast at 3 0 clock position. Post surgical changes otherwise. No regional adenopathy  LAB RESULTS:  CMP     Component Value Date/Time   NA 137 07/13/2023 1542   K 4.5 07/13/2023 1542   CL 107 07/13/2023 1542   CO2 28 07/13/2023 1542   GLUCOSE 89 07/13/2023 1542   BUN 21 (H) 07/13/2023 1542   CREATININE 0.94 07/13/2023 1542   CALCIUM 8.6  (L) 07/13/2023 1542   PROT 5.1 (L) 07/13/2023 1542   ALBUMIN 3.3 (L) 07/13/2023 1542   AST 17 07/13/2023 1542   ALT 11 07/13/2023 1542   ALKPHOS 33 (L) 07/13/2023 1542   BILITOT 0.2 (L) 07/13/2023 1542  GFRNONAA >60 07/13/2023 1542   GFRAA >60 06/08/2020 1607   GFRAA >60 11/13/2017 1512    No results found for: "TOTALPROTELP", "ALBUMINELP", "A1GS", "A2GS", "BETS", "BETA2SER", "GAMS", "MSPIKE", "SPEI"  No results found for: "KPAFRELGTCHN", "LAMBDASER", "KAPLAMBRATIO"  Lab Results  Component Value Date   WBC 7.1 07/13/2023   NEUTROABS 3.7 07/13/2023   HGB 13.3 07/13/2023   HCT 39.8 07/13/2023   MCV 87.7 07/13/2023   PLT 283 07/13/2023      Chemistry      Component Value Date/Time   NA 137 07/13/2023 1542   K 4.5 07/13/2023 1542   CL 107 07/13/2023 1542   CO2 28 07/13/2023 1542   BUN 21 (H) 07/13/2023 1542   CREATININE 0.94 07/13/2023 1542      Component Value Date/Time   CALCIUM 8.6 (L) 07/13/2023 1542   ALKPHOS 33 (L) 07/13/2023 1542   AST 17 07/13/2023 1542   ALT 11 07/13/2023 1542   BILITOT 0.2 (L) 07/13/2023 1542       No results found for: "LABCA2"  No components found for: "UEAVWU981"  No results for input(s): "INR" in the last 168 hours.  No results found for: "LABCA2"  No results found for: "XBJ478"  No results found for: "CAN125"  No results found for: "CAN153"  No results found for: "CA2729"  No components found for: "HGQUANT"  No results found for: "CEA1", "CEA" / No results found for: "CEA1", "CEA"   No results found for: "AFPTUMOR"  No results found for: "CHROMOGRNA"  No results found for: "HGBA", "HGBA2QUANT", "HGBFQUANT", "HGBSQUAN" (Hemoglobinopathy evaluation)   No results found for: "LDH"  No results found for: "IRON", "TIBC", "IRONPCTSAT" (Iron and TIBC)  No results found for: "FERRITIN"  Urinalysis No results found for: "COLORURINE", "APPEARANCEUR", "LABSPEC", "PHURINE", "GLUCOSEU", "HGBUR", "BILIRUBINUR",  "KETONESUR", "PROTEINUR", "UROBILINOGEN", "NITRITE", "LEUKOCYTESUR"   STUDIES: No results found.   ELIGIBLE FOR AVAILABLE RESEARCH PROTOCOL: no  ASSESSMENT: 53 y.o. [place] woman status post right breast upper inner quadrant biopsy 07/11/2017 for a clinical T1c N0, stage IA invasive lobular carcinoma, E-cadherin negative, grade 1 or 2, estrogen and progesterone receptor positive, HER-2 nonamplified, with an MIB-1 of 1%  (1) biopsy of left breast upper outer quadrant calcifications 07/11/2017 showed atypical lobular hyperplasia  (a) breast MRI 07/19/2017 shows 2 additional areas of concern in the left breast, with MRI guided biopsy 07/25/2017 showing ductal carcinoma in situ x2, low-grade, estrogen and progesterone receptor strongly positive.  (2) genetics testing 07/27/2017 through the STAT Breast cancer panel offered by [facility] found no deleterious mutations in ATM, BRCA1, BRCA2, CDH1, CHEK2, PALB2, PTEN, STK11 and TP53.   Reflexed to the Multi-Gene Panel offered by [facility] finding no deleterious mutations in ALK, APC, ATM, AXIN2,BAP1,  BARD1, BLM, BMPR1A, BRCA1, BRCA2, BRIP1, CASR, CDC73, CDH1, CDK4, CDKN1B, CDKN1C, CDKN2A (p14ARF), CDKN2A (p16INK4a), CEBPA, CHEK2, CTNNA1, DICER1, DIS3L2, EGFR (c.2369C>T, p.Thr790Met variant only), EPCAM (Deletion/duplication testing only), FH, FLCN, GATA2, GPC3, GREM1 (Promoter region deletion/duplication testing only), HOXB13 (c.251G>A, p.Gly84Glu), HRAS, KIT, MAX, MEN1, MET, MITF (c.952G>A, p.Glu318Lys variant only), MLH1, MSH2, MSH3, MSH6, MUTYH, NBN, NF1, NF2, NTHL1, PALB2, PDGFRA, PHOX2B, PMS2, POLD1, POLE, POT1, PRKAR1A, PTCH1, PTEN, RAD50, RAD51C, RAD51D, RB1, RECQL4, RET, RUNX1, SDHAF2, SDHA (sequence changes only), SDHB, SDHC, SDHD, SMAD4, SMARCA4, SMARCB1, SMARCE1, STK11, SUFU, TERT, TERT, TMEM127, TP53, TSC1, TSC2, VHL, WRN and WT1.   (3) tamoxifen started neoadjuvantly 07/24/2017  (a) held July 2019 secondary to hives, resumed November 2019  [(b)  Bx of hives = urticaria, treated with omalizumab]  (  4) status post right lumpectomy and sentinel lymph node sampling 08/16/2017 for a pT1c pN0, stage IA invasive lobular carcinoma, grade 2, total of 3 sentinel nodes removed  (5) status post left lumpectomy 08/16/2017 for ductal carcinoma in situ, low-grade, with focally positive margins, Tis NX.  (a) additional surgery for margin clearance 08/27/2017 was successful ([id number])  (6) Oncotype DX score of 12 predicts a 10-year risk of recurrence outside the breast of 8% if the patient's only systemic therapy is tamoxifen for 5 years.  It also predicts no benefit from chemotherapy.  (7) adjuvant radiation 10/04/2018 to 11/22/2017: 50.4 Gy to the left breast with a 10 Gy boost as well as  50.4 Gy to the right breast with a 12 Gy boost  (8) to continue tamoxifen a minimum of 5 years, then consider 2 years additional aromatase inhibitor treatment  (a) on 06/08/2020 FSH was 44.4 with estradiol 60.3 (perimenopausal).   PLAN:  Estrogen Receptor Positive Breast Cancer On Tamoxifen with concerns about potential side effects including joint aches and cognitive fog. Discussed the possibility of these symptoms improving after discontinuation of Tamoxifen. Recent mammogram at [facility] was normal. Patient is 6.5 years post-treatment. No current concerns. She doesn't want to consider any more of tamoxifen at this time She is worried about endometrial hyperplasia and risk of endometrial malignancy.   Postmenopausal bleeding Resolved. Atrophic endometrium on biopsy with no evidence of hyperplasia or atypia. Discussed possible correlation with tamoxifen use. -Discontinue tamoxifen after 6.5 years of use.  Breast Lump New lump noted in left breast at 3 o'clock position during physical exam. Likely scar tissue or cyst, but requires further investigation due to patient's history of breast cancer. -Order ultrasound of left breast for further evaluation.  She  requested another referral to plastic surgery, Dr Ellan Lambert. This has been sent. If her mammo and [place] from [place] do not show any evidence of concerns, then she can follow up with her PCP and gynecology for breast exam, annual screening mammograms and PRN follow up with [place].  Total time spent: 30 minutes  *Total Encounter Time as defined by the Centers for Medicare and Medicaid Services includes, in addition to the face-to-face time of a patient visit (documented in the note above) non-face-to-face time: obtaining and reviewing outside history, ordering and reviewing medications, tests or procedures, care coordination (communications with other health care professionals or caregivers) and documentation in the medical record.

## 2023-11-01 ENCOUNTER — Telehealth: Payer: Self-pay | Admitting: Hematology and Oncology

## 2023-11-01 NOTE — Telephone Encounter (Signed)
Spoke with patient confirming upcoming appointment  

## 2023-11-14 ENCOUNTER — Encounter: Payer: Self-pay | Admitting: Hematology and Oncology

## 2023-11-21 ENCOUNTER — Inpatient Hospital Stay: Payer: 59 | Attending: Hematology and Oncology | Admitting: Hematology and Oncology

## 2023-11-21 DIAGNOSIS — C50211 Malignant neoplasm of upper-inner quadrant of right female breast: Secondary | ICD-10-CM

## 2023-11-21 DIAGNOSIS — Z170 Estrogen receptor positive status [ER+]: Secondary | ICD-10-CM

## 2023-11-22 ENCOUNTER — Telehealth: Payer: Self-pay | Admitting: Hematology and Oncology

## 2023-11-22 NOTE — Telephone Encounter (Signed)
I called patient to review her ultrasound results.  Most recent ultrasound in the area of concern with no abnormality or concern for malignancy.  She is very thankful to receive the results.  We will otherwise see her as scheduled.

## 2023-11-22 NOTE — Progress Notes (Signed)
Discussed U/S results

## 2023-12-06 ENCOUNTER — Encounter: Payer: Self-pay | Admitting: Hematology and Oncology

## 2023-12-07 ENCOUNTER — Encounter: Payer: Self-pay | Admitting: Plastic Surgery

## 2023-12-07 ENCOUNTER — Ambulatory Visit (INDEPENDENT_AMBULATORY_CARE_PROVIDER_SITE_OTHER): Payer: Self-pay | Admitting: Plastic Surgery

## 2023-12-07 VITALS — BP 118/82 | HR 83 | Ht 66.0 in | Wt 179.8 lb

## 2023-12-07 DIAGNOSIS — Z853 Personal history of malignant neoplasm of breast: Secondary | ICD-10-CM | POA: Diagnosis not present

## 2023-12-07 DIAGNOSIS — Z170 Estrogen receptor positive status [ER+]: Secondary | ICD-10-CM

## 2023-12-07 DIAGNOSIS — Z923 Personal history of irradiation: Secondary | ICD-10-CM

## 2023-12-07 DIAGNOSIS — N651 Disproportion of reconstructed breast: Secondary | ICD-10-CM

## 2023-12-07 DIAGNOSIS — D0510 Intraductal carcinoma in situ of unspecified breast: Secondary | ICD-10-CM

## 2023-12-07 DIAGNOSIS — Z9013 Acquired absence of bilateral breasts and nipples: Secondary | ICD-10-CM

## 2023-12-07 NOTE — Progress Notes (Signed)
 Patient ID: Marie Castro, female    DOB: [birth year], 53 y.o.   MRN: [id number]   Chief Complaint  Patient presents with   Advice Only   Breast Problem    The patient is a 53 year old female here with her husband for evaluation of her breast.  The patient was diagnosed with right upper inner quadrant invasive lobular carcinoma that was estrogen and progesterone positive.  She also had a left ductal carcinoma in situ that was estrogen and receptor positive.  She underwent bilateral partial mastectomies followed by bilateral radiation in 2018.  The radiation ended in 2019.  The right side improved a little bit in its shape but the left side has continued to have contraction.  This has pulled the breast to the left.  Is also mated about a cup size smaller than the right.    Review of Systems  Constitutional: Negative.   HENT: Negative.    Eyes: Negative.   Respiratory: Negative.  Negative for chest tightness and shortness of breath.   Cardiovascular: Negative.   Gastrointestinal: Negative.   Endocrine: Negative.   Genitourinary: Negative.   Musculoskeletal: Negative.     Past Medical History:  Diagnosis Date   Anxiety    Cancer [facility])    breast   Family history of breast cancer    Family history of heart disease    Family history of prostate cancer    History of radiation therapy 10/04/17-11/22/17   left breast 50.4 Gy in 28 fractions, left breast boost 10 Gy in 5 fractions, right breast 50.4 Gy in 28 fractions, right breast boost 12 Gy in 6 fractions   Hypothyroidism    Idiopathic urticaria    Malignant neoplasm of upper-outer quadrant of left female breast (HCC)    Migraine    Overweight    Thyroid disease     Past Surgical History:  Procedure Laterality Date   BREAST BIOPSY Right 07/12/2017   BREAST LUMPECTOMY WITH RADIOACTIVE SEED AND SENTINEL LYMPH NODE BIOPSY Right 08/16/2017   Procedure: RIGHT BREAST LUMPECTOMY WITH RADIOACTIVE SEED AND RIGHT SENTINEL LYMPH NODE  BIOPSY;  Surgeon: Almond Lint, MD;  Location: [facility] OR;  Service: General;  Laterality: Right;   BREAST LUMPECTOMY WITH RADIOACTIVE SEED LOCALIZATION Left 08/16/2017   Procedure: LEFT BREAST PARTIAL MASTECTOMY WITH BRACKETED RADIOACTIVE SEED LOCALIZATION;  Surgeon: Almond Lint, MD;  Location: [facility] OR;  Service: General;  Laterality: Left;   DILATION AND CURETTAGE OF UTERUS     KNEE SURGERY     MULTIPLE TOOTH EXTRACTIONS     RE-EXCISION OF BREAST LUMPECTOMY Left 08/27/2017   Procedure: LEFT RE-EXCISION OF BREAST LUMPECTOMY ERAS PATHWAY;  Surgeon: Almond Lint, MD;  Location: [facility];  Service: General;  Laterality: Left;   WISDOM TOOTH EXTRACTION        Current Outpatient Medications:    CVS D3 25 MCG (1000 UT) capsule, TAKE 1 CAPSULE BY MOUTH EVERY DAY, Disp: 120 capsule, Rfl: 4   diphenhydrAMINE (BENADRYL) 25 mg capsule, Take one capsule (25 mg dose) by mouth every 6 (six) hours as needed for Itching., Disp: , Rfl:    eletriptan (RELPAX) 40 MG tablet, Take 1 tablet (40 mg total) by mouth as needed for migraine or headache. May repeat in 2 hours if headache persists or recurs., Disp: , Rfl:    ibuprofen (ADVIL,MOTRIN) 200 MG tablet, Take 400-600 mg by mouth daily as needed for headache or moderate pain., Disp: , Rfl:    levocetirizine (  XYZAL) 5 MG tablet, Take by mouth., Disp: , Rfl:    levothyroxine (SYNTHROID) 112 MCG tablet, Take 112 mcg by mouth every morning., Disp: , Rfl:    levothyroxine (SYNTHROID, LEVOTHROID) 100 MCG tablet, Take 100 mcg by mouth daily before breakfast., Disp: , Rfl:    LORazepam (ATIVAN) 0.5 MG tablet, Take 0.5 mg by mouth 2 (two) times daily as needed for anxiety., Disp: , Rfl:    ondansetron (ZOFRAN-ODT) 8 MG disintegrating tablet, TAKE 1 TABLET (8 MG TOTAL) BY MOUTH EVERY 8 (EIGHT) HOURS AS NEEDED FOR NAUSEA OR VOMITING., Disp: 18 tablet, Rfl: 3   phentermine 30 MG capsule, Take 30 mg by mouth daily., Disp: , Rfl:    topiramate (TOPAMAX) 100 MG  tablet, Take 100 mg by mouth daily., Disp: , Rfl:    venlafaxine XR (EFFEXOR-XR) 150 MG 24 hr capsule, Take 150 mg by mouth daily., Disp: , Rfl:    zoledronic acid (RECLAST) 5 MG/100ML SOLN injection, Infuse 100 mLs (5 mg total) into the vein once. Every other year for preventative, Disp: , Rfl:    Objective:   Vitals:   12/07/23 0927  BP: 118/82  Pulse: 83  SpO2: 97%    Physical Exam Vitals reviewed.  Constitutional:      Appearance: Normal appearance.  HENT:     Head: Atraumatic.  Cardiovascular:     Rate and Rhythm: Normal rate.     Pulses: Normal pulses.  Pulmonary:     Effort: Pulmonary effort is normal.  Abdominal:     General: There is no distension.     Palpations: Abdomen is soft.  Musculoskeletal:        General: Deformity present. No swelling or tenderness.  Skin:    General: Skin is warm.     Capillary Refill: Capillary refill takes less than 2 seconds.     Coloration: Skin is not jaundiced.     Findings: No bruising.  Neurological:     Mental Status: She is alert and oriented to person, place, and time.  Psychiatric:        Mood and Affect: Mood normal.        Behavior: Behavior normal.        Thought Content: Thought content normal.        Judgment: Judgment normal.     Assessment & Plan:  Ductal carcinoma in situ (DCIS) of breast, unspecified laterality  Breast asymmetry following reconstructive surgery  S/P partial mastectomy, bilateral  Malignant neoplasm of upper-inner quadrant of right breast in female, estrogen receptor positive (HCC)  The patient is a good candidate for revision.  Breast reconstruction is recommended for left breast excision of scar contracture with fat grafting for symmetry and prevention of reoccurring scar contracture.  The patient is in agreement to hold off on doing anything to the right side unless it is necessary due to the radiation history.  Pictures were obtained of the patient and placed in the chart with the  patient's or guardian's permission.   Alena Bills Jerica Creegan, DO

## 2024-02-27 ENCOUNTER — Encounter: Admitting: Physician Assistant

## 2024-03-26 ENCOUNTER — Ambulatory Visit: Admitting: Adult Health

## 2024-03-26 ENCOUNTER — Encounter: Payer: Self-pay | Admitting: Adult Health

## 2024-03-26 VITALS — BP 124/84 | HR 87 | Ht 65.0 in | Wt 162.0 lb

## 2024-03-26 DIAGNOSIS — F331 Major depressive disorder, recurrent, moderate: Secondary | ICD-10-CM | POA: Diagnosis not present

## 2024-03-26 DIAGNOSIS — F411 Generalized anxiety disorder: Secondary | ICD-10-CM | POA: Diagnosis not present

## 2024-03-26 MED ORDER — VORTIOXETINE HBR 10 MG PO TABS: 10.0000 mg | ORAL_TABLET | Freq: Every day | ORAL | 2 refills | Status: DC

## 2024-03-26 MED ORDER — LISDEXAMFETAMINE DIMESYLATE 40 MG PO CAPS: 40.0000 mg | ORAL_CAPSULE | ORAL | 0 refills | Status: DC

## 2024-03-26 MED ORDER — LORAZEPAM 0.5 MG PO TABS
0.5000 mg | ORAL_TABLET | Freq: Two times a day (BID) | ORAL | 0 refills | Status: DC | PRN
Start: 2024-03-26 — End: 2024-05-05

## 2024-03-26 NOTE — Progress Notes (Signed)
 [facility] MD/PA/NP Initial Note  03/26/2024 2:11 PM Marie Castro  MRN:  [id number]  Chief Complaint:   HPI:   Patient seen today for initial psychiatric evaluation.   Describes mood today as ok. Pleasant. Reports tearfulness. Mood symptoms - reports depression, anxiety and irritability - not as overwhelming as it has been recently. Reports decreased interest and motivation. Denies panic attacks. Denies worry, rumination and over thinking. Reports mood has been variable, but is improving. Stating I feel like I'm getting to a better place. Report situational stressor contributing to recent mood symptoms - loss of mother - knee surgery. Recently tapered off Effexor  in March. Was then started on Trintellix 10mg  and feels it has been helpful. Taking medications as prescribed.  Energy levels lower. Active, does not have a regular exercise routine with current physical disabilities - working with P/[employer]. Enjoys some usual interests and activities. Married x 22 years. Has 2 children. Family local. Spending time with family. Appetite decreased. Reports some weight loss.  Sleeps better some nights than others. Averages 4 to 5 hours since recent surgery. Focus and concentration stable. Completing tasks. Managing aspects of household. Working for time [employer]. Denies SI or HI.  Denies AH or VH. Denies self harm. Denies substance use.  Previous medication trials: Effexor   Visit Diagnosis: No diagnosis found.  Past Psychiatric History: Denies psychiatric hospitalization.   Past Medical History:  Past Medical History:  Diagnosis Date   Anxiety    Cancer (HCC)    breast   Family history of breast cancer    Family history of heart disease    Family history of prostate cancer    History of radiation therapy 10/04/17-11/22/17   left breast 50.4 Gy in 28 fractions, left breast boost 10 Gy in 5 fractions, right breast 50.4 Gy in 28 fractions, right breast boost 12 Gy in 6 fractions    Hypothyroidism    Idiopathic urticaria    Malignant neoplasm of upper-outer quadrant of left female breast (HCC)    Migraine    Overweight    Thyroid disease     Past Surgical History:  Procedure Laterality Date   BREAST BIOPSY Right 07/12/2017   BREAST LUMPECTOMY WITH RADIOACTIVE SEED AND SENTINEL LYMPH NODE BIOPSY Right 08/16/2017   Procedure: RIGHT BREAST LUMPECTOMY WITH RADIOACTIVE SEED AND RIGHT SENTINEL LYMPH NODE BIOPSY;  Surgeon: Lockie Rima, MD;  Location: [facility] OR;  Service: General;  Laterality: Right;   BREAST LUMPECTOMY WITH RADIOACTIVE SEED LOCALIZATION Left 08/16/2017   Procedure: LEFT BREAST PARTIAL MASTECTOMY WITH BRACKETED RADIOACTIVE SEED LOCALIZATION;  Surgeon: Lockie Rima, MD;  Location: [facility] OR;  Service: General;  Laterality: Left;   DILATION AND CURETTAGE OF UTERUS     KNEE SURGERY     MULTIPLE TOOTH EXTRACTIONS     RE-EXCISION OF BREAST LUMPECTOMY Left 08/27/2017   Procedure: LEFT RE-EXCISION OF BREAST LUMPECTOMY ERAS PATHWAY;  Surgeon: Lockie Rima, MD;  Location: [facility];  Service: General;  Laterality: Left;   WISDOM TOOTH EXTRACTION      Family Psychiatric History: Family history of anxiety and depression.  Family History:  Family History  Problem Relation Age of Onset   Cancer Mother        SKIN AND BREAST   Heart disease Mother    Heart disease Father    Hyperlipidemia Father    Depression Father    Cancer Maternal Grandmother        BREAST   Melanoma Maternal Grandmother  toe   Cancer Maternal Grandfather        PROSTATE   Irritable bowel syndrome Paternal Grandmother    Heart disease Paternal Grandfather    Breast cancer Maternal Aunt 72   Breast cancer Maternal Aunt        dx in her 35s    Social History:  Social History   Socioeconomic History   Marital status: Married    Spouse name: Not on file   Number of children: 2   Years of education: Not on file   Highest education level: Not on file  Occupational  History   Not on file  Tobacco Use   Smoking status: Never   Smokeless tobacco: Never  Vaping Use   Vaping status: Never Used  Substance and Sexual Activity   Alcohol use: No   Drug use: No   Sexual activity: Yes  Other Topics Concern   Not on file  Social History Narrative   Not on file   Social Drivers of Health   Financial Resource Strain: Not on file  Food Insecurity: No Food Insecurity (09/28/2022)   [facility]   Hunger Vital Sign    Within the past 12 months, you worried that your food would run out before you got the money to buy more.: Never true    Within the past 12 months, the food you bought just didn't last and you didn't have money to get more.: Never true  Transportation Needs: Not on file  Physical Activity: Not on file  Stress: Not on file  Social Connections: Unknown (09/28/2022)   Received from [facility]   Social Network    Social Network: Not on file    Allergies:  Allergies  Allergen Reactions   Latex Itching, Rash and Other (See Comments)    Pt states rubber bands from her braces causes ulcers in her mouth  mouth ulcers   Oxycodone  Rash and Other (See Comments)    Pt states could not sleep at all, was up 24 hrs     Celecoxib  Other (See Comments)    GI Upset (intolerance)    Metabolic Disorder Labs: No results found for: HGBA1C, MPG No results found for: PROLACTIN Lab Results  Component Value Date   CHOL 132 07/12/2022   TRIG 74 07/12/2022   HDL 48 07/12/2022   CHOLHDL 2.8 07/12/2022   LDLCALC 69 07/12/2022   No results found for: TSH  Therapeutic Level Labs: No results found for: LITHIUM No results found for: VALPROATE No results found for: CBMZ  Current Medications: Current Outpatient Medications  Medication Sig Dispense Refill   [facility] D3 25 MCG (1000 UT) capsule TAKE 1 CAPSULE BY MOUTH EVERY DAY 120 capsule 4   diphenhydrAMINE  (BENADRYL ) 25 mg capsule Take one capsule (25 mg dose) by mouth  every 6 (six) hours as needed for Itching.     eletriptan (RELPAX) 40 MG tablet Take 1 tablet (40 mg total) by mouth as needed for migraine or headache. May repeat in 2 hours if headache persists or recurs.     ibuprofen (ADVIL,MOTRIN) 200 MG tablet Take 400-600 mg by mouth daily as needed for headache or moderate pain.     levocetirizine (XYZAL) 5 MG tablet Take by mouth.     levothyroxine (SYNTHROID) 112 MCG tablet Take 112 mcg by mouth every morning.     levothyroxine (SYNTHROID, LEVOTHROID) 100 MCG tablet Take 100 mcg by mouth daily before breakfast.     LORazepam (ATIVAN) 0.5  MG tablet Take 0.5 mg by mouth 2 (two) times daily as needed for anxiety.     ondansetron  (ZOFRAN -ODT) 8 MG disintegrating tablet TAKE 1 TABLET (8 MG TOTAL) BY MOUTH EVERY 8 (EIGHT) HOURS AS NEEDED FOR NAUSEA OR VOMITING. 18 tablet 3   phentermine 30 MG capsule Take 30 mg by mouth daily.     topiramate (TOPAMAX) 100 MG tablet Take 100 mg by mouth daily.     venlafaxine  XR (EFFEXOR -XR) 150 MG 24 hr capsule Take 150 mg by mouth daily.     zoledronic acid (RECLAST) 5 MG/100ML SOLN injection Infuse 100 mLs (5 mg total) into the vein once. Every other year for preventative     No current facility-administered medications for this visit.    Medication Side Effects: none  Orders placed this visit:  No orders of the defined types were placed in this encounter.   Psychiatric Specialty Exam:  Review of Systems  Musculoskeletal:  Negative for gait problem.  Neurological:  Negative for tremors.  Psychiatric/Behavioral:         Please refer to HPI    Last menstrual period 04/08/2018.There is no height or weight on file to calculate BMI.  General Appearance: Casual and Neat  Eye Contact:  Good  Speech:  Clear and Coherent and Normal Rate  Volume:  Normal  Mood:  Anxious and Depressed  Affect:  Appropriate, Congruent, and Tearful  Thought Process:  Coherent  Orientation:  Full (Time, Place, and Person)  Thought  Content: Logical   Suicidal Thoughts:  No  Homicidal Thoughts:  No  Memory:  WNL  Judgement:  Good  Insight:  Good  Psychomotor Activity:  Normal  Concentration:  Concentration: Good and Attention Span: Good  Recall:  Good  Fund of Knowledge: Good  Language: Good  Assets:  Communication Skills Desire for Improvement Financial Resources/Insurance Housing Intimacy Leisure Time Physical Health Resilience Social Support Talents/Skills Transportation Vocational/Educational  ADL's:  Intact  Cognition: WNL  Prognosis:  Good   Screenings:  PHQ2-9    Flowsheet Row FOLLOW UP NEW from 09/12/2017 in [facility] Radiation Oncology CONSULT from 07/25/2017 in [facility] Radiation [facility]  PHQ-2 Total Score 0 0   Flowsheet Row ED from 09/11/2022 in [facility] Emergency Department at [facility] ED from 03/22/2021 in [facility] Emergency Department at [facility]  C-SSRS RISK CATEGORY No Risk No Risk    Receiving Psychotherapy: Yes   Treatment Plan/Recommendations:   Plan:  PDMP reviewed  Trintellix 10mg  daily - started a week ago Lorazepam 0.5mg  twice daily as needed  Vyvanse 40mg  daily  RTC 4 weeks  Patient advised to contact office with any questions, adverse effects, or acute worsening in signs and symptoms.   60 minutes spent dedicated to the care of this patient on the date of this encounter to include pre-visit review of records, ordering of medication, post visit documentation, and face-to-face time with the patient discussing depression and anxiety. Discussed continuing current medication regimen.   Alter Moss N Bevely Hackbart, NP

## 2024-04-17 ENCOUNTER — Other Ambulatory Visit: Payer: Self-pay

## 2024-04-17 ENCOUNTER — Telehealth: Payer: Self-pay | Admitting: Adult Health

## 2024-04-17 DIAGNOSIS — F331 Major depressive disorder, recurrent, moderate: Secondary | ICD-10-CM

## 2024-04-17 MED ORDER — VORTIOXETINE HBR 20 MG PO TABS: 20.0000 mg | ORAL_TABLET | Freq: Every day | ORAL | 1 refills | Status: DC

## 2024-04-17 NOTE — Telephone Encounter (Signed)
 Rtc to pt and she reports taking Trintellix  10 mg since 6/08 and she is asking if ok to increase her dose now. She tried not taking a prn lorazepam  1 day and it didn't work. She is trying not to depend on the lorazepam . Advised her that shouldn't be a problem since she has been taking that long and I would check with Angeline to make sure.   Also her Vyvanse  Rx [facility]'t [facility] but they are flying out that day. Advised her we can okay her Rx to be picked up on the 30 th instead. She was appreciative of that.

## 2024-04-17 NOTE — Telephone Encounter (Signed)
 Pt aware and new [facility] for [facility]  Her last Vyvanse  was filled 04/08/24 and needs to be filled on or before 05/07/24, will pend that closer to time.

## 2024-04-17 NOTE — Telephone Encounter (Signed)
 Pt called at 9:21a asking to speak to Tillman about her Trintellix  and Vyvanse  dosages.   Next appt 7/30

## 2024-04-22 ENCOUNTER — Encounter: Admitting: Physician Assistant

## 2024-05-05 ENCOUNTER — Other Ambulatory Visit: Payer: Self-pay

## 2024-05-05 ENCOUNTER — Telehealth: Payer: Self-pay | Admitting: Adult Health

## 2024-05-05 DIAGNOSIS — F331 Major depressive disorder, recurrent, moderate: Secondary | ICD-10-CM

## 2024-05-05 DIAGNOSIS — F411 Generalized anxiety disorder: Secondary | ICD-10-CM

## 2024-05-05 NOTE — Progress Notes (Deleted)
 Patient ID: Marie Castro, female    DOB: 1971-06-16, 53 y.o.   MRN: [id number]  No chief complaint on file.   No diagnosis found.   History of Present Illness: Marie Castro is a 53 y.o.  female  with a history of bilateral breast cancer with history of partial mastectomies and bilateral radiation.  She presents for preoperative evaluation for upcoming procedure, excision of left breast scar contracture with fat grafting for improved symmetry, scheduled for 05/19/2024 with Dr. Lowery.  The patient {HAS HAS [id number]} had problems with anesthesia. ***  Summary of Previous Visit: Patient met with Dr. Lowery 12/07/2023 for consult.  At that time, discussed her previous reconstruction.  The left side had contraction as result of her radiation treatment and partial mastectomy.  The breast was also pulled laterally and approximately a size smaller.  Discussed options for reconstruction for symmetry.  Plan for excision of left breast scar contracture with fat grafting.  Will hold off on any right-sided intervention given history of radiation.  Patient was understanding of risks and agreeable to plan.  Job: ***  PMH Significant for: Breast cancer with bilateral partial mastectomy and radiation treatments, migraine disorder, thyroid disorder, MDD.   Past Medical History: Allergies: Allergies  Allergen Reactions   Latex Itching, Rash and Other (See Comments)    Pt states rubber bands from her braces causes ulcers in her mouth  mouth ulcers   Oxycodone  Rash and Other (See Comments)    Pt states could not sleep at all, was up 24 hrs     Celecoxib  Other (See Comments)    GI Upset (intolerance)    Current Medications:  Current Outpatient Medications:    [facility] D3 25 MCG (1000 UT) capsule, TAKE 1 CAPSULE BY MOUTH EVERY DAY, Disp: 120 capsule, Rfl: 4   diphenhydrAMINE  (BENADRYL ) 25 mg capsule, Take one capsule (25 mg dose) by mouth every 6 (six) hours as needed for Itching., Disp: ,  Rfl:    eletriptan (RELPAX) 40 MG tablet, Take 1 tablet (40 mg total) by mouth as needed for migraine or headache. May repeat in 2 hours if headache persists or recurs., Disp: , Rfl:    ibuprofen (ADVIL,MOTRIN) 200 MG tablet, Take 400-600 mg by mouth daily as needed for headache or moderate pain., Disp: , Rfl:    levocetirizine (XYZAL) 5 MG tablet, Take by mouth., Disp: , Rfl:    levothyroxine (SYNTHROID) 112 MCG tablet, Take 112 mcg by mouth every morning., Disp: , Rfl:    levothyroxine (SYNTHROID, LEVOTHROID) 100 MCG tablet, Take 100 mcg by mouth daily before breakfast., Disp: , Rfl:    lisdexamfetamine (VYVANSE ) 40 MG capsule, Take 1 capsule (40 mg total) by mouth every morning., Disp: 30 capsule, Rfl: 0   LORazepam  (ATIVAN ) 0.5 MG tablet, Take 1 tablet (0.5 mg total) by mouth 2 (two) times daily as needed for anxiety., Disp: 60 tablet, Rfl: 0   ondansetron  (ZOFRAN -ODT) 8 MG disintegrating tablet, TAKE 1 TABLET (8 MG TOTAL) BY MOUTH EVERY 8 (EIGHT) HOURS AS NEEDED FOR NAUSEA OR VOMITING., Disp: 18 tablet, Rfl: 3   vortioxetine  HBr (TRINTELLIX ) 20 MG TABS tablet, Take 1 tablet (20 mg total) by mouth daily., Disp: 30 tablet, Rfl: 1   zoledronic acid (RECLAST) 5 MG/100ML SOLN injection, Infuse 100 mLs (5 mg total) into the vein once. Every other year for preventative, Disp: , Rfl:   Past Medical Problems: Past Medical History:  Diagnosis Date   Anxiety  Cancer [facility])    breast   Family history of breast cancer    Family history of heart disease    Family history of prostate cancer    History of radiation therapy 10/04/17-11/22/17   left breast 50.4 Gy in 28 fractions, left breast boost 10 Gy in 5 fractions, right breast 50.4 Gy in 28 fractions, right breast boost 12 Gy in 6 fractions   Hypothyroidism    Idiopathic urticaria    Malignant neoplasm of upper-outer quadrant of left female breast (HCC)    Migraine    Overweight    Thyroid disease     Past Surgical History: Past Surgical  History:  Procedure Laterality Date   BREAST BIOPSY Right 07/12/2017   BREAST LUMPECTOMY WITH RADIOACTIVE SEED AND SENTINEL LYMPH NODE BIOPSY Right 08/16/2017   Procedure: RIGHT BREAST LUMPECTOMY WITH RADIOACTIVE SEED AND RIGHT SENTINEL LYMPH NODE BIOPSY;  Surgeon: Aron Shoulders, MD;  Location: [facility] OR;  Service: General;  Laterality: Right;   BREAST LUMPECTOMY WITH RADIOACTIVE SEED LOCALIZATION Left 08/16/2017   Procedure: LEFT BREAST PARTIAL MASTECTOMY WITH BRACKETED RADIOACTIVE SEED LOCALIZATION;  Surgeon: Aron Shoulders, MD;  Location: [facility] OR;  Service: General;  Laterality: Left;   DILATION AND CURETTAGE OF UTERUS     KNEE SURGERY     MULTIPLE TOOTH EXTRACTIONS     RE-EXCISION OF BREAST LUMPECTOMY Left 08/27/2017   Procedure: LEFT RE-EXCISION OF BREAST LUMPECTOMY ERAS PATHWAY;  Surgeon: Aron Shoulders, MD;  Location: [facility];  Service: General;  Laterality: Left;   WISDOM TOOTH EXTRACTION      Social History: Social History   Socioeconomic History   Marital status: Married    Spouse name: Not on file   Number of children: 2   Years of education: Not on file   Highest education level: Not on file  Occupational History   Not on file  Tobacco Use   Smoking status: Never   Smokeless tobacco: Never  Vaping Use   Vaping status: Never Used  Substance and Sexual Activity   Alcohol use: No   Drug use: No   Sexual activity: Yes  Other Topics Concern   Not on file  Social History Narrative   Not on file   Social Drivers of Health   Financial Resource Strain: Not on file  Food Insecurity: No Food Insecurity (09/28/2022)   Received from [facility]   Hunger Vital Sign    Within the past 12 months, you worried that your food would run out before you got the money to buy more.: Never true    Within the past 12 months, the food you bought just didn't last and you didn't have money to get more.: Never true  Transportation Needs: Not on file  Physical Activity: Not on  file  Stress: Not on file  Social Connections: Unknown (09/28/2022)   Received from [facility]   Social Network    Social Network: Not on file  Intimate Partner Violence: Unknown (09/28/2022)   Received from [facility]   HITS    Physically Hurt: Not on file    Insult or Talk Down To: Not on file    Threaten Physical Harm: Not on file    Scream or Curse: Not on file    Family History: Family History  Problem Relation Age of Onset   Cancer Mother        SKIN AND BREAST   Heart disease Mother    Heart disease Father  Hyperlipidemia Father    Depression Father    Cancer Maternal Grandmother        BREAST   Melanoma Maternal Grandmother        toe   Cancer Maternal Grandfather        PROSTATE   Irritable bowel syndrome Paternal Grandmother    Heart disease Paternal Grandfather    Breast cancer Maternal Aunt 72   Breast cancer Maternal Aunt        dx in her 66s    Review of Systems: ROS  Physical Exam: Vital Signs LMP 04/08/2018   Physical Exam *** Constitutional:      General: Not in acute distress.    Appearance: Normal appearance. Not ill-appearing.  HENT:     Head: Normocephalic and atraumatic.  Eyes:     Pupils: Pupils are equal, round. Cardiovascular:     Rate and Rhythm: Normal rate.    Pulses: Normal pulses.  Pulmonary:     Effort: No respiratory distress or increased work of breathing.  Speaks in full sentences. Abdominal:     General: Abdomen is flat. No distension.   Musculoskeletal: Normal range of motion. No lower extremity swelling or edema. No varicosities. *** Skin:    General: Skin is warm and dry.     Findings: No erythema or rash.  Neurological:     Mental Status: Alert and oriented to person, place, and time.  Psychiatric:        Mood and Affect: Mood normal.        Behavior: Behavior normal.    Assessment/Plan: The patient is scheduled for excision of left breast scar contracture with fat grafting for improved symmetry with  Dr. Lowery.  Risks, benefits, and alternatives of procedure discussed, questions answered and consent obtained.    Smoking Status: ***; Counseling Given? ***  Last Mammogram: 11/09/2023; Results: BI-RADS Category 1: Negative.  Caprini Score: ***; Risk Factors include: ***, BMI *** 25, and length of planned surgery. Recommendation for mechanical *** pharmacological prophylaxis. Encourage early ambulation.   Pictures obtained: 12/07/2023  Post-op Rx sent to pharmacy: ***  Patient was provided with the General Surgical Risk consent document and Pain Medication Agreement prior to their appointment.  They had adequate time to read through the risk consent documents and Pain Medication Agreement. We also discussed them in person together during this preop appointment. All of their questions were answered to their satisfaction.  Recommended calling if they have any further questions.  Risk consent form and Pain Medication Agreement to be scanned into patient's chart.    Electronically signed by: Honora Seip, PA-C 05/05/2024 10:00 AM

## 2024-05-05 NOTE — Telephone Encounter (Signed)
 Pended lorazepam  and Vyvanse 

## 2024-05-05 NOTE — Telephone Encounter (Signed)
 Pt is going out of town on Thursday and needs a RF on Lorazapam and Vyvanse . Pharm told her it needs to say Ok to fill[facility]    [facility] [address]

## 2024-05-06 ENCOUNTER — Encounter: Payer: Self-pay | Admitting: Surgical

## 2024-05-06 ENCOUNTER — Encounter: Admitting: Physician Assistant

## 2024-05-06 ENCOUNTER — Ambulatory Visit (INDEPENDENT_AMBULATORY_CARE_PROVIDER_SITE_OTHER): Admitting: Surgical

## 2024-05-06 VITALS — BP 106/73 | HR 94 | Ht 65.5 in | Wt 161.6 lb

## 2024-05-06 DIAGNOSIS — N651 Disproportion of reconstructed breast: Secondary | ICD-10-CM

## 2024-05-06 DIAGNOSIS — D0510 Intraductal carcinoma in situ of unspecified breast: Secondary | ICD-10-CM

## 2024-05-06 DIAGNOSIS — Z170 Estrogen receptor positive status [ER+]: Secondary | ICD-10-CM

## 2024-05-06 DIAGNOSIS — Z9013 Acquired absence of bilateral breasts and nipples: Secondary | ICD-10-CM

## 2024-05-06 MED ORDER — TRAMADOL HCL 50 MG PO TABS: 50.0000 mg | ORAL_TABLET | Freq: Three times a day (TID) | ORAL | 0 refills | Status: AC | PRN

## 2024-05-06 MED ORDER — LORAZEPAM 0.5 MG PO TABS
0.5000 mg | ORAL_TABLET | Freq: Two times a day (BID) | ORAL | 0 refills | Status: DC | PRN
Start: 2024-05-06 — End: 2024-06-03

## 2024-05-06 MED ORDER — CEPHALEXIN 500 MG PO CAPS: 500.0000 mg | ORAL_CAPSULE | Freq: Four times a day (QID) | ORAL | 0 refills | Status: AC

## 2024-05-06 MED ORDER — LISDEXAMFETAMINE DIMESYLATE 40 MG PO CAPS: 40.0000 mg | ORAL_CAPSULE | ORAL | 0 refills | Status: DC

## 2024-05-06 MED ORDER — ONDANSETRON HCL 4 MG PO TABS: 4.0000 mg | ORAL_TABLET | Freq: Three times a day (TID) | ORAL | 0 refills | Status: DC | PRN

## 2024-05-06 NOTE — Progress Notes (Signed)
 Patient ID: Marie Castro, female    DOB: 1971/10/01, 53 y.o.   MRN: [id number]  Chief Complaint  Patient presents with   Pre-op Exam      ICD-10-CM   1. Ductal carcinoma in situ (DCIS) of breast, unspecified laterality  D05.10     2. Breast asymmetry following reconstructive surgery  N65.1     3. S/P partial mastectomy, bilateral  Z90.13     4. Malignant neoplasm of upper-inner quadrant of right breast in female, estrogen receptor positive (HCC)  C50.211    Z17.0      History of Present Illness: Marie Castro is a 53 y.o.  female  with a history of bilateral breast cancer with history of partial mastectomies and bilateral radiation.  She presents for preoperative evaluation for upcoming procedure, excision of left breast scar contracture with fat grafting for improved symmetry, scheduled for 05/19/2024 with Dr. Lowery.  The patient has not had problems with anesthesia. No history of DVT/PE.  No family history of DVT/PE.  No family or personal history of bleeding or clotting disorders.  Patient is not currently taking any blood thinners.  No history of CVA/MI.   Summary of Previous Visit: Patient met with Dr. Lowery 12/07/2023 for consult.  At that time, discussed her previous reconstruction.  The left side had contraction as result of her radiation treatment and partial mastectomy.  The breast was also pulled laterally and approximately a size smaller.  Discussed options for reconstruction for symmetry.  Plan for excision of left breast scar contracture with fat grafting.  Will hold off on any right-sided intervention given history of radiation.  Patient was understanding of risks and agreeable to plan.  Job: Patient is not requesting any forms for out of work  PMH Significant for: Breast cancer with bilateral partial mastectomy and radiation treatments to bilateral breast, migraine disorder, thyroid disorder, MDD.  Patient reports she has been feeling well lately, no  significant changes to her health recently.  She did undergo right knee replacement earlier this year (May 2025) and has healed well from this.  She is now ambulating normally and is no longer on aspirin for post procedure protocol.  She does not have any cardiac or pulmonary symptoms.   Past Medical History: Allergies: Allergies  Allergen Reactions   Latex Itching, Rash and Other (See Comments)    Pt states rubber bands from her braces causes ulcers in her mouth  mouth ulcers   Oxycodone  Rash and Other (See Comments)    Pt states could not sleep at all, was up 24 hrs     Celecoxib  Other (See Comments)    GI Upset (intolerance)    Current Medications:  Current Outpatient Medications:    cephALEXin  (KEFLEX ) 500 MG capsule, Take 1 capsule (500 mg total) by mouth 4 (four) times daily for 3 days., Disp: 12 capsule, Rfl: 0   [employer] D3 25 MCG (1000 UT) capsule, TAKE 1 CAPSULE BY MOUTH EVERY DAY, Disp: 120 capsule, Rfl: 4   diphenhydrAMINE  (BENADRYL ) 25 mg capsule, Take one capsule (25 mg dose) by mouth every 6 (six) hours as needed for Itching., Disp: , Rfl:    eletriptan (RELPAX) 40 MG tablet, Take 1 tablet (40 mg total) by mouth as needed for migraine or headache. May repeat in 2 hours if headache persists or recurs., Disp: , Rfl:    famotidine (PEPCID) 20 MG tablet, Take 20 mg by mouth 2 (two) times daily., Disp: , Rfl:  ibuprofen (ADVIL,MOTRIN) 200 MG tablet, Take 400-600 mg by mouth daily as needed for headache or moderate pain., Disp: , Rfl:    levocetirizine (XYZAL) 5 MG tablet, Take by mouth., Disp: , Rfl:    levothyroxine (SYNTHROID) 112 MCG tablet, Take 112 mcg by mouth every morning., Disp: , Rfl:    lisdexamfetamine (VYVANSE ) 40 MG capsule, Take 1 capsule (40 mg total) by mouth every morning., Disp: 30 capsule, Rfl: 0   LORazepam  (ATIVAN ) 0.5 MG tablet, Take 1 tablet (0.5 mg total) by mouth 2 (two) times daily as needed for anxiety., Disp: 60 tablet, Rfl: 0   ondansetron   (ZOFRAN ) 4 MG tablet, Take 1 tablet (4 mg total) by mouth every 8 (eight) hours as needed for nausea or vomiting., Disp: 20 tablet, Rfl: 0   ondansetron  (ZOFRAN -ODT) 8 MG disintegrating tablet, TAKE 1 TABLET (8 MG TOTAL) BY MOUTH EVERY 8 (EIGHT) HOURS AS NEEDED FOR NAUSEA OR VOMITING., Disp: 18 tablet, Rfl: 3   OVER THE COUNTER MEDICATION, Stool Softner 100mg -Take as directed., Disp: , Rfl:    traMADol  (ULTRAM ) 50 MG tablet, Take 1 tablet (50 mg total) by mouth every 8 (eight) hours as needed for up to 5 days., Disp: 15 tablet, Rfl: 0   vortioxetine  HBr (TRINTELLIX ) 20 MG TABS tablet, Take 1 tablet (20 mg total) by mouth daily., Disp: 30 tablet, Rfl: 1   zoledronic acid (RECLAST) 5 MG/100ML SOLN injection, Infuse 100 mLs (5 mg total) into the vein once. Every other year for preventative, Disp: , Rfl:    levothyroxine (SYNTHROID, LEVOTHROID) 100 MCG tablet, Take 100 mcg by mouth daily before breakfast. (Patient not taking: Reported on 05/06/2024), Disp: , Rfl:   Past Medical Problems: Past Medical History:  Diagnosis Date   Anxiety    Cancer (HCC)    breast   Family history of breast cancer    Family history of heart disease    Family history of prostate cancer    History of radiation therapy 10/04/17-11/22/17   left breast 50.4 Gy in 28 fractions, left breast boost 10 Gy in 5 fractions, right breast 50.4 Gy in 28 fractions, right breast boost 12 Gy in 6 fractions   Hypothyroidism    Idiopathic urticaria    Malignant neoplasm of upper-outer quadrant of left female breast (HCC)    Migraine    Overweight    Thyroid disease     Past Surgical History: Past Surgical History:  Procedure Laterality Date   BREAST BIOPSY Right 07/12/2017   BREAST LUMPECTOMY WITH RADIOACTIVE SEED AND SENTINEL LYMPH NODE BIOPSY Right 08/16/2017   Procedure: RIGHT BREAST LUMPECTOMY WITH RADIOACTIVE SEED AND RIGHT SENTINEL LYMPH NODE BIOPSY;  Surgeon: Aron Shoulders, MD;  Location: [facility] OR;  Service: General;   Laterality: Right;   BREAST LUMPECTOMY WITH RADIOACTIVE SEED LOCALIZATION Left 08/16/2017   Procedure: LEFT BREAST PARTIAL MASTECTOMY WITH BRACKETED RADIOACTIVE SEED LOCALIZATION;  Surgeon: Aron Shoulders, MD;  Location: [facility] OR;  Service: General;  Laterality: Left;   DILATION AND CURETTAGE OF UTERUS     KNEE SURGERY     MULTIPLE TOOTH EXTRACTIONS     RE-EXCISION OF BREAST LUMPECTOMY Left 08/27/2017   Procedure: LEFT RE-EXCISION OF BREAST LUMPECTOMY ERAS PATHWAY;  Surgeon: Aron Shoulders, MD;  Location: [facility];  Service: General;  Laterality: Left;   WISDOM TOOTH EXTRACTION      Social History: Social History   Socioeconomic History   Marital status: Married    Spouse name: Not on file  Number of children: 2   Years of education: Not on file   Highest education level: Not on file  Occupational History   Not on file  Tobacco Use   Smoking status: Never   Smokeless tobacco: Never  Vaping Use   Vaping status: Never Used  Substance and Sexual Activity   Alcohol use: No   Drug use: No   Sexual activity: Yes  Other Topics Concern   Not on file  Social History Narrative   Not on file   Social Drivers of Health   Financial Resource Strain: Not on file  Food Insecurity: No Food Insecurity (09/28/2022)   Received from [facility]   Hunger Vital Sign    Within the past 12 months, you worried that your food would run out before you got the money to buy more.: Never true    Within the past 12 months, the food you bought just didn't last and you didn't have money to get more.: Never true  Transportation Needs: Not on file  Physical Activity: Not on file  Stress: Not on file  Social Connections: Unknown (09/28/2022)   Received from [facility]   Social Network    Social Network: Not on file  Intimate Partner Violence: Unknown (09/28/2022)   Received from [facility]   HITS    Physically Hurt: Not on file    Insult or Talk Down To: Not on file    Threaten  Physical Harm: Not on file    Scream or Curse: Not on file    Family History: Family History  Problem Relation Age of Onset   Cancer Mother        SKIN AND BREAST   Heart disease Mother    Heart disease Father    Hyperlipidemia Father    Depression Father    Cancer Maternal Grandmother        BREAST   Melanoma Maternal Grandmother        toe   Cancer Maternal Grandfather        PROSTATE   Irritable bowel syndrome Paternal Grandmother    Heart disease Paternal Grandfather    Breast cancer Maternal Aunt 72   Breast cancer Maternal Aunt        dx in her 46s    Review of Systems: Review of Systems  Constitutional: Negative.   Respiratory: Negative.    Cardiovascular: Negative.   Gastrointestinal: Negative.   Neurological: Negative.     Physical Exam: Vital Signs BP 106/73 (BP Location: Right Arm, Patient Position: Sitting, Cuff Size: Large)   Pulse 94   Ht 5' 5.5 (1.664 m)   Wt 161 lb 9.6 oz (73.3 kg)   LMP 04/08/2018   SpO2 97%   BMI 26.48 kg/m   Physical Exam Constitutional:      General: Not in acute distress.    Appearance: Normal appearance. Not ill-appearing.  HENT:     Head: Normocephalic and atraumatic.  Eyes:     Pupils: Pupils are equal, round. Cardiovascular:     Rate and Rhythm: Normal rate.    Pulses: Normal pulses.  Pulmonary:     Effort: No respiratory distress or increased work of breathing.  Speaks in full sentences. Abdominal:     General: Abdomen is flat. No distension.   Musculoskeletal: Normal range of motion. No lower extremity swelling or edema. Skin:    General: Skin is warm and dry.     Findings: No erythema or rash.  Neurological:  Mental Status: Alert and oriented to person, place, and time.  Psychiatric:        Mood and Affect: Mood normal.        Behavior: Behavior normal.    Assessment/Plan: The patient is scheduled for excision of left breast scar contracture with fat grafting for improved symmetry with Dr.  Lowery.  Risks, benefits, and alternatives of procedure discussed, questions answered and consent obtained.    Smoking Status: Non-smoker; Counseling Given?  N/A  Last Mammogram: 11/09/2023; Results: BI-RADS Category 1: Negative.  Caprini Score: 7; Risk Factors include: Age, varicose vein of lower extremity, history of breast cancer, BMI > 25, and length of planned surgery. Recommendation for mechanical prophylaxis. Encourage early ambulation.   Pictures obtained: 12/07/2023  Post-op Rx sent to pharmacy: Tramadol , Zofran , Keflex   Patient was provided with the General Surgical Risk consent document and Pain Medication Agreement prior to their appointment.  They had adequate time to read through the risk consent documents and Pain Medication Agreement. We also discussed them in person together during this preop appointment. All of their questions were answered to their satisfaction.  Recommended calling if they have any further questions.  Risk consent form and Pain Medication Agreement to be scanned into patient's chart.  The risks that can be encountered with and after liposuction were discussed and include the following but no limited to these:  Asymmetry, fluid accumulation, firmness of the area, fat necrosis with death of fat tissue, bleeding, infection, delayed healing, anesthesia risks, skin sensation changes, injury to structures including nerves, blood vessels, and muscles which may be temporary or permanent, allergies to tape, suture materials and glues, blood products, topical preparations or injected agents, skin and contour irregularities, skin discoloration and swelling, deep vein thrombosis, cardiac and pulmonary complications, pain, which may persist, persistent pain, recurrence of the lesion, poor healing of the incision, possible need for revisional surgery or staged procedures. Thiere can also be persistent swelling, poor wound healing, rippling or loose skin, worsening of  cellulite, swelling, and thermal burn or heat injury from ultrasound with the ultrasound-assisted lipoplasty technique. Any change in weight fluctuations can alter the outcome.   Electronically signed by: Donnice PARAS Bernisha Verma, PA-C 05/06/2024 2:33 PM

## 2024-05-06 NOTE — H&P (View-Only) (Signed)
 Patient ID: Marie Castro, female    DOB: 1971/10/01, 53 y.o.   MRN: [id number]  Chief Complaint  Patient presents with   Pre-op Exam      ICD-10-CM   1. Ductal carcinoma in situ (DCIS) of breast, unspecified laterality  D05.10     2. Breast asymmetry following reconstructive surgery  N65.1     3. S/P partial mastectomy, bilateral  Z90.13     4. Malignant neoplasm of upper-inner quadrant of right breast in female, estrogen receptor positive (HCC)  C50.211    Z17.0      History of Present Illness: Marie Castro is a 53 y.o.  female  with a history of bilateral breast cancer with history of partial mastectomies and bilateral radiation.  She presents for preoperative evaluation for upcoming procedure, excision of left breast scar contracture with fat grafting for improved symmetry, scheduled for 05/19/2024 with Dr. Lowery.  The patient has not had problems with anesthesia. No history of DVT/PE.  No family history of DVT/PE.  No family or personal history of bleeding or clotting disorders.  Patient is not currently taking any blood thinners.  No history of CVA/MI.   Summary of Previous Visit: Patient met with Dr. Lowery 12/07/2023 for consult.  At that time, discussed her previous reconstruction.  The left side had contraction as result of her radiation treatment and partial mastectomy.  The breast was also pulled laterally and approximately a size smaller.  Discussed options for reconstruction for symmetry.  Plan for excision of left breast scar contracture with fat grafting.  Will hold off on any right-sided intervention given history of radiation.  Patient was understanding of risks and agreeable to plan.  Job: Patient is not requesting any forms for out of work  PMH Significant for: Breast cancer with bilateral partial mastectomy and radiation treatments to bilateral breast, migraine disorder, thyroid disorder, MDD.  Patient reports she has been feeling well lately, no  significant changes to her health recently.  She did undergo right knee replacement earlier this year (May 2025) and has healed well from this.  She is now ambulating normally and is no longer on aspirin for post procedure protocol.  She does not have any cardiac or pulmonary symptoms.   Past Medical History: Allergies: Allergies  Allergen Reactions   Latex Itching, Rash and Other (See Comments)    Pt states rubber bands from her braces causes ulcers in her mouth  mouth ulcers   Oxycodone  Rash and Other (See Comments)    Pt states could not sleep at all, was up 24 hrs     Celecoxib  Other (See Comments)    GI Upset (intolerance)    Current Medications:  Current Outpatient Medications:    cephALEXin  (KEFLEX ) 500 MG capsule, Take 1 capsule (500 mg total) by mouth 4 (four) times daily for 3 days., Disp: 12 capsule, Rfl: 0   CVS D3 25 MCG (1000 UT) capsule, TAKE 1 CAPSULE BY MOUTH EVERY DAY, Disp: 120 capsule, Rfl: 4   diphenhydrAMINE  (BENADRYL ) 25 mg capsule, Take one capsule (25 mg dose) by mouth every 6 (six) hours as needed for Itching., Disp: , Rfl:    eletriptan (RELPAX) 40 MG tablet, Take 1 tablet (40 mg total) by mouth as needed for migraine or headache. May repeat in 2 hours if headache persists or recurs., Disp: , Rfl:    famotidine (PEPCID) 20 MG tablet, Take 20 mg by mouth 2 (two) times daily., Disp: , Rfl:  ibuprofen (ADVIL,MOTRIN) 200 MG tablet, Take 400-600 mg by mouth daily as needed for headache or moderate pain., Disp: , Rfl:    levocetirizine (XYZAL) 5 MG tablet, Take by mouth., Disp: , Rfl:    levothyroxine (SYNTHROID) 112 MCG tablet, Take 112 mcg by mouth every morning., Disp: , Rfl:    lisdexamfetamine (VYVANSE ) 40 MG capsule, Take 1 capsule (40 mg total) by mouth every morning., Disp: 30 capsule, Rfl: 0   LORazepam  (ATIVAN ) 0.5 MG tablet, Take 1 tablet (0.5 mg total) by mouth 2 (two) times daily as needed for anxiety., Disp: 60 tablet, Rfl: 0   ondansetron   (ZOFRAN ) 4 MG tablet, Take 1 tablet (4 mg total) by mouth every 8 (eight) hours as needed for nausea or vomiting., Disp: 20 tablet, Rfl: 0   ondansetron  (ZOFRAN -ODT) 8 MG disintegrating tablet, TAKE 1 TABLET (8 MG TOTAL) BY MOUTH EVERY 8 (EIGHT) HOURS AS NEEDED FOR NAUSEA OR VOMITING., Disp: 18 tablet, Rfl: 3   OVER THE COUNTER MEDICATION, Stool Softner 100mg -Take as directed., Disp: , Rfl:    traMADol  (ULTRAM ) 50 MG tablet, Take 1 tablet (50 mg total) by mouth every 8 (eight) hours as needed for up to 5 days., Disp: 15 tablet, Rfl: 0   vortioxetine  HBr (TRINTELLIX ) 20 MG TABS tablet, Take 1 tablet (20 mg total) by mouth daily., Disp: 30 tablet, Rfl: 1   zoledronic acid (RECLAST) 5 MG/100ML SOLN injection, Infuse 100 mLs (5 mg total) into the vein once. Every other year for preventative, Disp: , Rfl:    levothyroxine (SYNTHROID, LEVOTHROID) 100 MCG tablet, Take 100 mcg by mouth daily before breakfast. (Patient not taking: Reported on 05/06/2024), Disp: , Rfl:   Past Medical Problems: Past Medical History:  Diagnosis Date   Anxiety    Cancer (HCC)    breast   Family history of breast cancer    Family history of heart disease    Family history of prostate cancer    History of radiation therapy 10/04/17-11/22/17   left breast 50.4 Gy in 28 fractions, left breast boost 10 Gy in 5 fractions, right breast 50.4 Gy in 28 fractions, right breast boost 12 Gy in 6 fractions   Hypothyroidism    Idiopathic urticaria    Malignant neoplasm of upper-outer quadrant of left female breast (HCC)    Migraine    Overweight    Thyroid disease     Past Surgical History: Past Surgical History:  Procedure Laterality Date   BREAST BIOPSY Right 07/12/2017   BREAST LUMPECTOMY WITH RADIOACTIVE SEED AND SENTINEL LYMPH NODE BIOPSY Right 08/16/2017   Procedure: RIGHT BREAST LUMPECTOMY WITH RADIOACTIVE SEED AND RIGHT SENTINEL LYMPH NODE BIOPSY;  Surgeon: Aron Shoulders, MD;  Location: [facility] OR;  Service: General;   Laterality: Right;   BREAST LUMPECTOMY WITH RADIOACTIVE SEED LOCALIZATION Left 08/16/2017   Procedure: LEFT BREAST PARTIAL MASTECTOMY WITH BRACKETED RADIOACTIVE SEED LOCALIZATION;  Surgeon: Aron Shoulders, MD;  Location: [facility];  Service: General;  Laterality: Left;   DILATION AND CURETTAGE OF UTERUS     KNEE SURGERY     MULTIPLE TOOTH EXTRACTIONS     RE-EXCISION OF BREAST LUMPECTOMY Left 08/27/2017   Procedure: LEFT RE-EXCISION OF BREAST LUMPECTOMY ERAS PATHWAY;  Surgeon: Aron Shoulders, MD;  Location: [facility];  Service: General;  Laterality: Left;   WISDOM TOOTH EXTRACTION      Social History: Social History   Socioeconomic History   Marital status: Married    Spouse name: Not on file  Number of children: 2   Years of education: Not on file   Highest education level: Not on file  Occupational History   Not on file  Tobacco Use   Smoking status: Never   Smokeless tobacco: Never  Vaping Use   Vaping status: Never Used  Substance and Sexual Activity   Alcohol use: No   Drug use: No   Sexual activity: Yes  Other Topics Concern   Not on file  Social History Narrative   Not on file   Social Drivers of Health   Financial Resource Strain: Not on file  Food Insecurity: No Food Insecurity (09/28/2022)   Received from [facility]   Hunger Vital Sign    Within the past 12 months, you worried that your food would run out before you got the money to buy more.: Never true    Within the past 12 months, the food you bought just didn't last and you didn't have money to get more.: Never true  Transportation Needs: Not on file  Physical Activity: Not on file  Stress: Not on file  Social Connections: Unknown (09/28/2022)   Received from [facility]   Social Network    Social Network: Not on file  Intimate Partner Violence: Unknown (09/28/2022)   Received from [facility]   HITS    Physically Hurt: Not on file    Insult or Talk Down To: Not on file    Threaten  Physical Harm: Not on file    Scream or Curse: Not on file    Family History: Family History  Problem Relation Age of Onset   Cancer Mother        SKIN AND BREAST   Heart disease Mother    Heart disease Father    Hyperlipidemia Father    Depression Father    Cancer Maternal Grandmother        BREAST   Melanoma Maternal Grandmother        toe   Cancer Maternal Grandfather        PROSTATE   Irritable bowel syndrome Paternal Grandmother    Heart disease Paternal Grandfather    Breast cancer Maternal Aunt 72   Breast cancer Maternal Aunt        dx in her 46s    Review of Systems: Review of Systems  Constitutional: Negative.   Respiratory: Negative.    Cardiovascular: Negative.   Gastrointestinal: Negative.   Neurological: Negative.     Physical Exam: Vital Signs BP 106/73 (BP Location: Right Arm, Patient Position: Sitting, Cuff Size: Large)   Pulse 94   Ht 5' 5.5 (1.664 m)   Wt 161 lb 9.6 oz (73.3 kg)   LMP 04/08/2018   SpO2 97%   BMI 26.48 kg/m   Physical Exam Constitutional:      General: Not in acute distress.    Appearance: Normal appearance. Not ill-appearing.  HENT:     Head: Normocephalic and atraumatic.  Eyes:     Pupils: Pupils are equal, round. Cardiovascular:     Rate and Rhythm: Normal rate.    Pulses: Normal pulses.  Pulmonary:     Effort: No respiratory distress or increased work of breathing.  Speaks in full sentences. Abdominal:     General: Abdomen is flat. No distension.   Musculoskeletal: Normal range of motion. No lower extremity swelling or edema. Skin:    General: Skin is warm and dry.     Findings: No erythema or rash.  Neurological:  Mental Status: Alert and oriented to person, place, and time.  Psychiatric:        Mood and Affect: Mood normal.        Behavior: Behavior normal.    Assessment/Plan: The patient is scheduled for excision of left breast scar contracture with fat grafting for improved symmetry with Dr.  Lowery.  Risks, benefits, and alternatives of procedure discussed, questions answered and consent obtained.    Smoking Status: Non-smoker; Counseling Given?  N/A  Last Mammogram: 11/09/2023; Results: BI-RADS Category 1: Negative.  Caprini Score: 7; Risk Factors include: Age, varicose vein of lower extremity, history of breast cancer, BMI > 25, and length of planned surgery. Recommendation for mechanical prophylaxis. Encourage early ambulation.   Pictures obtained: 12/07/2023  Post-op Rx sent to pharmacy: Tramadol , Zofran , Keflex   Patient was provided with the General Surgical Risk consent document and Pain Medication Agreement prior to their appointment.  They had adequate time to read through the risk consent documents and Pain Medication Agreement. We also discussed them in person together during this preop appointment. All of their questions were answered to their satisfaction.  Recommended calling if they have any further questions.  Risk consent form and Pain Medication Agreement to be scanned into patient's chart.  The risks that can be encountered with and after liposuction were discussed and include the following but no limited to these:  Asymmetry, fluid accumulation, firmness of the area, fat necrosis with death of fat tissue, bleeding, infection, delayed healing, anesthesia risks, skin sensation changes, injury to structures including nerves, blood vessels, and muscles which may be temporary or permanent, allergies to tape, suture materials and glues, blood products, topical preparations or injected agents, skin and contour irregularities, skin discoloration and swelling, deep vein thrombosis, cardiac and pulmonary complications, pain, which may persist, persistent pain, recurrence of the lesion, poor healing of the incision, possible need for revisional surgery or staged procedures. Thiere can also be persistent swelling, poor wound healing, rippling or loose skin, worsening of  cellulite, swelling, and thermal burn or heat injury from ultrasound with the ultrasound-assisted lipoplasty technique. Any change in weight fluctuations can alter the outcome.   Electronically signed by: Donnice PARAS Bernisha Verma, PA-C 05/06/2024 2:33 PM

## 2024-05-07 ENCOUNTER — Encounter: Payer: Self-pay | Admitting: Adult Health

## 2024-05-07 ENCOUNTER — Telehealth: Admitting: Adult Health

## 2024-05-07 DIAGNOSIS — F411 Generalized anxiety disorder: Secondary | ICD-10-CM

## 2024-05-07 DIAGNOSIS — F902 Attention-deficit hyperactivity disorder, combined type: Secondary | ICD-10-CM | POA: Diagnosis not present

## 2024-05-07 DIAGNOSIS — F331 Major depressive disorder, recurrent, moderate: Secondary | ICD-10-CM | POA: Diagnosis not present

## 2024-05-07 NOTE — Progress Notes (Signed)
 Marie Castro [id number] 03/31/1971 53 y.o.  Virtual Visit via Video Note  I connected with pt @ on 05/07/24 at  8:30 AM EDT by a video enabled telemedicine application and verified that I am speaking with the correct person using two identifiers.   I discussed the limitations of evaluation and management by telemedicine and the availability of in person appointments. The patient expressed understanding and agreed to proceed.  I discussed the assessment and treatment plan with the patient. The patient was provided an opportunity to ask questions and all were answered. The patient agreed with the plan and demonstrated an understanding of the instructions.   The patient was advised to call back or seek an in-person evaluation if the symptoms worsen or if the condition fails to improve as anticipated.  I provided 25 minutes of non-face-to-face time during this encounter.  The patient was located at home.  The provider was located at [facility].   Angeline LOISE Sayers, NP   Subjective:   Patient ID:  Marie Castro is a 53 y.o. (DOB 05-03-1971) female.  Chief Complaint: No chief complaint on file.   HPI YULEIDY RAPPLEYE presents for follow-up of MDD, GAD and ADD.  Describes mood today as ok. Pleasant. Reports decreased tearfulness. Mood symptoms - reports decreased depression, anxiety and irritability. Reports improved interest and motivation. Denies panic attacks. Denies worry, rumination and over thinking. Denies obsessive thoughts or acts. Reports grieving loss of mother. Recovering from knee surgery. Reports mood has improved. Stating I feel more like myself. Feels like current medication regimen is helpful to manage mood symptoms. Taking medications as prescribed.  Energy levels improved. Active, does not have a regular exercise routine with current physical disabilities - working with P/T. Enjoys some usual interests and activities. Married x 22 years. Has 2 children. Family  local. Spending time with family. Appetite improved. Reports some weight loss.  Reports difficulties getting to sleep - up until 2am. Averages 4 hours since recent surgery. Has tried Lorazepam  and Ativan . Focus and concentration stable - taking Vyvanse  at 7:30 am. Completing tasks. Managing aspects of household. Working for time [employer]. Denies SI or HI.  Denies AH or VH. Denies self harm. Denies substance use.  Previous medication trials: Effexor   Review of Systems:  Review of Systems  Musculoskeletal:  Negative for gait problem.  Neurological:  Negative for tremors.  Psychiatric/Behavioral:         Please refer to HPI    Medications: I have reviewed the patient's current medications.  Current Outpatient Medications  Medication Sig Dispense Refill   cephALEXin  (KEFLEX ) 500 MG capsule Take 1 capsule (500 mg total) by mouth 4 (four) times daily for 3 days. 12 capsule 0   [employer] D3 25 MCG (1000 UT) capsule TAKE 1 CAPSULE BY MOUTH EVERY DAY 120 capsule 4   diphenhydrAMINE  (BENADRYL ) 25 mg capsule Take one capsule (25 mg dose) by mouth every 6 (six) hours as needed for Itching.     eletriptan (RELPAX) 40 MG tablet Take 1 tablet (40 mg total) by mouth as needed for migraine or headache. May repeat in 2 hours if headache persists or recurs.     famotidine (PEPCID) 20 MG tablet Take 20 mg by mouth 2 (two) times daily.     ibuprofen (ADVIL,MOTRIN) 200 MG tablet Take 400-600 mg by mouth daily as needed for headache or moderate pain.     levocetirizine (XYZAL) 5 MG tablet Take by mouth.  levothyroxine (SYNTHROID) 112 MCG tablet Take 112 mcg by mouth every morning.     levothyroxine (SYNTHROID, LEVOTHROID) 100 MCG tablet Take 100 mcg by mouth daily before breakfast. (Patient not taking: Reported on 05/06/2024)     lisdexamfetamine (VYVANSE ) 40 MG capsule Take 1 capsule (40 mg total) by mouth every morning. 30 capsule 0   LORazepam  (ATIVAN ) 0.5 MG tablet Take 1 tablet (0.5 mg total) by  mouth 2 (two) times daily as needed for anxiety. 60 tablet 0   ondansetron  (ZOFRAN ) 4 MG tablet Take 1 tablet (4 mg total) by mouth every 8 (eight) hours as needed for nausea or vomiting. 20 tablet 0   ondansetron  (ZOFRAN -ODT) 8 MG disintegrating tablet TAKE 1 TABLET (8 MG TOTAL) BY MOUTH EVERY 8 (EIGHT) HOURS AS NEEDED FOR NAUSEA OR VOMITING. 18 tablet 3   OVER THE COUNTER MEDICATION Stool Softner 100mg -Take as directed.     traMADol  (ULTRAM ) 50 MG tablet Take 1 tablet (50 mg total) by mouth every 8 (eight) hours as needed for up to 5 days. 15 tablet 0   vortioxetine  HBr (TRINTELLIX ) 20 MG TABS tablet Take 1 tablet (20 mg total) by mouth daily. 30 tablet 1   zoledronic acid (RECLAST) 5 MG/100ML SOLN injection Infuse 100 mLs (5 mg total) into the vein once. Every other year for preventative     No current facility-administered medications for this visit.    Medication Side Effects: None  Allergies:  Allergies  Allergen Reactions   Latex Itching, Rash and Other (See Comments)    Pt states rubber bands from her braces causes ulcers in her mouth  mouth ulcers   Oxycodone  Rash and Other (See Comments)    Pt states could not sleep at all, was up 24 hrs     Celecoxib  Other (See Comments)    GI Upset (intolerance)    Past Medical History:  Diagnosis Date   Anxiety    Cancer (HCC)    breast   Family history of breast cancer    Family history of heart disease    Family history of prostate cancer    History of radiation therapy 10/04/17-11/22/17   left breast 50.4 Gy in 28 fractions, left breast boost 10 Gy in 5 fractions, right breast 50.4 Gy in 28 fractions, right breast boost 12 Gy in 6 fractions   Hypothyroidism    Idiopathic urticaria    Malignant neoplasm of upper-outer quadrant of left female breast (HCC)    Migraine    Overweight    Thyroid disease     Family History  Problem Relation Age of Onset   Cancer Mother        SKIN AND BREAST   Heart disease Mother    Heart  disease Father    Hyperlipidemia Father    Depression Father    Cancer Maternal Grandmother        BREAST   Melanoma Maternal Grandmother        toe   Cancer Maternal Grandfather        PROSTATE   Irritable bowel syndrome Paternal Grandmother    Heart disease Paternal Grandfather    Breast cancer Maternal Aunt 72   Breast cancer Maternal Aunt        dx in her 2s    Social History   Socioeconomic History   Marital status: Married    Spouse name: Not on file   Number of children: 2   Years of education: Not on file  Highest education level: Not on file  Occupational History   Not on file  Tobacco Use   Smoking status: Never   Smokeless tobacco: Never  Vaping Use   Vaping status: Never Used  Substance and Sexual Activity   Alcohol use: No   Drug use: No   Sexual activity: Yes  Other Topics Concern   Not on file  Social History Narrative   Not on file   Social Drivers of Health   Financial Resource Strain: Not on file  Food Insecurity: No Food Insecurity (09/28/2022)   Received from [facility]   Hunger Vital Sign    Within the past 12 months, you worried that your food would run out before you got the money to buy more.: Never true    Within the past 12 months, the food you bought just didn't last and you didn't have money to get more.: Never true  Transportation Needs: Not on file  Physical Activity: Not on file  Stress: Not on file  Social Connections: Unknown (09/28/2022)   Received from [facility]   Social Network    Social Network: Not on file  Intimate Partner Violence: Unknown (09/28/2022)   Received from [facility]   HITS    Physically Hurt: Not on file    Insult or Talk Down To: Not on file    Threaten Physical Harm: Not on file    Scream or Curse: Not on file    Past Medical History, Surgical history, Social history, and Family history were reviewed and updated as appropriate.   Please see review of systems for further details on the  patient's review from today.   Objective:   Physical Exam:  LMP 04/08/2018   Physical Exam Constitutional:      General: She is not in acute distress. Musculoskeletal:        General: No deformity.  Neurological:     Mental Status: She is alert and oriented to person, place, and time.     Coordination: Coordination normal.  Psychiatric:        Attention and Perception: Attention and perception normal. She does not perceive auditory or visual hallucinations.        Mood and Affect: Mood normal. Mood is not anxious or depressed. Affect is not labile, blunt, angry or inappropriate.        Speech: Speech normal.        Behavior: Behavior normal.        Thought Content: Thought content normal. Thought content is not paranoid or delusional. Thought content does not include homicidal or suicidal ideation. Thought content does not include homicidal or suicidal plan.        Cognition and Memory: Cognition and memory normal.        Judgment: Judgment normal.     Comments: Insight intact     Lab Review:     Component Value Date/Time   NA 137 07/13/2023 1542   K 4.5 07/13/2023 1542   CL 107 07/13/2023 1542   CO2 28 07/13/2023 1542   GLUCOSE 89 07/13/2023 1542   BUN 21 (H) 07/13/2023 1542   CREATININE 0.94 07/13/2023 1542   CALCIUM 8.6 (L) 07/13/2023 1542   PROT 5.1 (L) 07/13/2023 1542   ALBUMIN 3.3 (L) 07/13/2023 1542   AST 17 07/13/2023 1542   ALT 11 07/13/2023 1542   ALKPHOS 33 (L) 07/13/2023 1542   BILITOT 0.2 (L) 07/13/2023 1542   GFRNONAA >60 07/13/2023 1542   GFRAA >60 06/08/2020  1607   GFRAA >60 11/13/2017 1512       Component Value Date/Time   WBC 7.1 07/13/2023 1542   RBC 4.54 07/13/2023 1542   HGB 13.3 07/13/2023 1542   HGB 12.9 07/11/2022 1411   HCT 39.8 07/13/2023 1542   PLT 283 07/13/2023 1542   PLT 298 07/11/2022 1411   MCV 87.7 07/13/2023 1542   MCH 29.3 07/13/2023 1542   MCHC 33.4 07/13/2023 1542   RDW 12.5 07/13/2023 1542   LYMPHSABS 1.9 07/13/2023  1542   MONOABS 0.6 07/13/2023 1542   EOSABS 0.8 (H) 07/13/2023 1542   BASOSABS 0.1 07/13/2023 1542    No results found for: POCLITH, LITHIUM   No results found for: PHENYTOIN, PHENOBARB, VALPROATE, CBMZ   .res Assessment: Plan:    Plan:  PDMP reviewed  Trintellix  20mg  daily  Lorazepam  0.5mg  twice daily as needed  Vyvanse  40mg  daily  RTC 4 weeks  Patient advised to contact office with any questions, adverse effects, or acute worsening in signs and symptoms.   25 minutes spent dedicated to the care of this patient on the date of this encounter to include pre-visit review of records, ordering of medication, post visit documentation, and face-to-face time with the patient discussing depression and anxiety. Discussed continuing current medication regimen.  Discussed potential benefits, risk, and side effects of benzodiazepines to include potential risk of tolerance and dependence, as well as possible drowsiness.  Advised patient not to drive if experiencing drowsiness and to take lowest possible effective dose to minimize risk of dependence and tolerance.   Discussed potential benefits, risks, and side effects of stimulants with patient to include increased heart rate, palpitations, insomnia, increased anxiety, increased irritability, or decreased appetite.  Instructed patient to contact office if experiencing any significant tolerability issues.   Diagnoses and all orders for this visit:  Major depressive disorder, recurrent episode, moderate (HCC)  Generalized anxiety disorder  Attention deficit hyperactivity disorder (ADHD), combined type     Please see After Visit Summary for patient specific instructions.  Future Appointments  Date Time Provider Department Center  05/27/2024  3:00 PM Dillingham, Estefana RAMAN, DO [facility] None  07/14/2024  1:00 PM [facility] None  07/14/2024  1:30 PM Iruku, Amber, MD [facility] None    No orders of the  defined types were placed in this encounter.     -------------------------------

## 2024-05-13 ENCOUNTER — Encounter: Payer: Self-pay | Admitting: Plastic Surgery

## 2024-05-13 ENCOUNTER — Telehealth: Payer: Self-pay | Admitting: Plastic Surgery

## 2024-05-13 NOTE — Telephone Encounter (Signed)
 Pt lvmail that the OR called to do her preop phone call , [place]17[place]24 and pt wants them to email her, Olam stated they can call her when she is back. Wanted to let you know for the OR team to call her before her sx, please advise

## 2024-05-16 ENCOUNTER — Encounter (HOSPITAL_COMMUNITY): Payer: Self-pay | Admitting: Plastic Surgery

## 2024-05-16 ENCOUNTER — Other Ambulatory Visit: Payer: Self-pay

## 2024-05-16 NOTE — Progress Notes (Signed)
 SDW CALL  Patient was given pre-op instructions over the phone. The opportunity was given for the patient to ask questions. No further questions asked. Patient verbalized understanding of instructions given.   PCP - Alvera Reagin, PA  Cardiologist -   PPM/ICD - denies Device Orders - n/a Rep Notified - n/a  Chest x-ray - denies EKG - 03-13-22 Stress Test - denies ECHO - denies Cardiac Cath - denies  Sleep Study - denies CPAP - n/a  DM -denies  Blood Thinner Instructions:denies Aspirin Instructions:n/a  ERAS Protcol -NPO   COVID TEST- n/a   Anesthesia review: no  Patient denies shortness of breath, fever, cough and chest pain over the phone call   All instructions explained to the patient, with a verbal understanding of the material. Patient agrees to go over the instructions while at home for a better understanding.

## 2024-05-19 ENCOUNTER — Other Ambulatory Visit: Payer: Self-pay

## 2024-05-19 ENCOUNTER — Encounter (HOSPITAL_COMMUNITY)
Admission: RE | Disposition: A | Discharge status: Home / Self Care | Payer: Self-pay | Source: Home / Self Care | Attending: Plastic Surgery

## 2024-05-19 ENCOUNTER — Ambulatory Visit (HOSPITAL_COMMUNITY): Payer: Self-pay | Admitting: Anesthesiology

## 2024-05-19 ENCOUNTER — Ambulatory Visit (HOSPITAL_BASED_OUTPATIENT_CLINIC_OR_DEPARTMENT_OTHER): Payer: Self-pay | Admitting: Anesthesiology

## 2024-05-19 ENCOUNTER — Encounter (HOSPITAL_COMMUNITY): Payer: Self-pay | Admitting: Plastic Surgery

## 2024-05-19 ENCOUNTER — Ambulatory Visit (HOSPITAL_COMMUNITY)
Admission: RE | Admit: 2024-05-19 | Discharge: 2024-05-19 | Disposition: A | Discharge status: Home / Self Care | Attending: Plastic Surgery | Admitting: Plastic Surgery

## 2024-05-19 DIAGNOSIS — F418 Other specified anxiety disorders: Secondary | ICD-10-CM | POA: Insufficient documentation

## 2024-05-19 DIAGNOSIS — T8544XD Capsular contracture of breast implant, subsequent encounter: Secondary | ICD-10-CM

## 2024-05-19 DIAGNOSIS — Z923 Personal history of irradiation: Secondary | ICD-10-CM | POA: Insufficient documentation

## 2024-05-19 DIAGNOSIS — N6489 Other specified disorders of breast: Secondary | ICD-10-CM | POA: Insufficient documentation

## 2024-05-19 DIAGNOSIS — Z853 Personal history of malignant neoplasm of breast: Secondary | ICD-10-CM | POA: Diagnosis not present

## 2024-05-19 DIAGNOSIS — N651 Disproportion of reconstructed breast: Secondary | ICD-10-CM | POA: Diagnosis not present

## 2024-05-19 HISTORY — PX: REVISION, RECONSTRUCTION, BREAST: SHX7641

## 2024-05-19 HISTORY — DX: Depression, unspecified: F32.A

## 2024-05-19 HISTORY — PX: LIPOSUCTION WITH LIPOFILLING: SHX6436

## 2024-05-19 HISTORY — PX: FAT INJECTION: SHX6355

## 2024-05-19 LAB — CBC
HCT: 37 % (ref 36.0–46.0)
Hemoglobin: 12.1 g/dL (ref 12.0–15.0)
MCH: 28 pg (ref 26.0–34.0)
MCHC: 32.7 g/dL (ref 30.0–36.0)
MCV: 85.6 fL (ref 80.0–100.0)
Platelets: 306 K/uL (ref 150–400)
RBC: 4.32 MIL/uL (ref 3.87–5.11)
RDW: 12.7 % (ref 11.5–15.5)
WBC: 4.6 K/uL (ref 4.0–10.5)
nRBC: 0 % (ref 0.0–0.2)

## 2024-05-19 SURGERY — REVISION, RECONSTRUCTION, BREAST
Anesthesia: General | Site: Breast | Laterality: Left

## 2024-05-19 MED ORDER — LIDOCAINE 2% (20 MG/ML) 5 ML SYRINGE
INTRAMUSCULAR | Status: DC | PRN
  Administered 2024-05-19 (×2): 60 mg via INTRAVENOUS

## 2024-05-19 MED ORDER — MIDAZOLAM HCL 2 MG/2ML IJ SOLN
INTRAMUSCULAR | Status: AC
  Filled 2024-05-19: qty 2

## 2024-05-19 MED ORDER — OXYCODONE HCL 5 MG PO TABS
ORAL_TABLET | ORAL | Status: AC
  Filled 2024-05-19: qty 1

## 2024-05-19 MED ORDER — DEXAMETHASONE SODIUM PHOSPHATE 10 MG/ML IJ SOLN
INTRAMUSCULAR | Status: AC
Start: 2024-05-19 — End: 2024-05-19
  Filled 2024-05-19: qty 1

## 2024-05-19 MED ORDER — FENTANYL CITRATE (PF) 250 MCG/5ML IJ SOLN
INTRAMUSCULAR | Status: DC | PRN
  Administered 2024-05-19 (×3): 50 ug via INTRAVENOUS
  Administered 2024-05-19: 100 ug via INTRAVENOUS
  Administered 2024-05-19: 50 ug via INTRAVENOUS
  Administered 2024-05-19: 100 ug via INTRAVENOUS
  Administered 2024-05-19 (×2): 50 ug via INTRAVENOUS

## 2024-05-19 MED ORDER — OXYCODONE HCL 5 MG PO TABS
5.0000 mg | ORAL_TABLET | ORAL | Status: DC | PRN
  Administered 2024-05-19 (×2): 5 mg via ORAL

## 2024-05-19 MED ORDER — PROPOFOL 10 MG/ML IV BOLUS
INTRAVENOUS | Status: AC
  Filled 2024-05-19: qty 20

## 2024-05-19 MED ORDER — CEFAZOLIN SODIUM-DEXTROSE 2-4 GM/100ML-% IV SOLN
2.0000 g | INTRAVENOUS | Status: AC
  Administered 2024-05-19 (×2): 2 g via INTRAVENOUS
  Filled 2024-05-19: qty 100

## 2024-05-19 MED ORDER — FENTANYL CITRATE (PF) 100 MCG/2ML IJ SOLN
INTRAMUSCULAR | Status: AC
  Filled 2024-05-19: qty 2

## 2024-05-19 MED ORDER — ACETAMINOPHEN 650 MG RE SUPP: 650.0000 mg | RECTAL | Status: DC | PRN

## 2024-05-19 MED ORDER — ORAL CARE MOUTH RINSE: 15.0000 mL | Freq: Once | OROMUCOSAL | Status: AC

## 2024-05-19 MED ORDER — DEXAMETHASONE SODIUM PHOSPHATE 10 MG/ML IJ SOLN
INTRAMUSCULAR | Status: DC | PRN
  Administered 2024-05-19 (×2): 10 mg via INTRAVENOUS

## 2024-05-19 MED ORDER — LORAZEPAM 2 MG/ML IJ SOLN
INTRAMUSCULAR | Status: AC
  Filled 2024-05-19: qty 1

## 2024-05-19 MED ORDER — SODIUM CHLORIDE 0.9% FLUSH: 3.0000 mL | INTRAVENOUS | Status: DC | PRN

## 2024-05-19 MED ORDER — LACTATED RINGERS IV SOLN
Freq: Once | INTRAVENOUS | Status: AC
  Administered 2024-05-19 (×2): 400 mL
  Filled 2024-05-19 (×2): qty 30

## 2024-05-19 MED ORDER — BUPIVACAINE-EPINEPHRINE (PF) 0.25% -1:200000 IJ SOLN
INTRAMUSCULAR | Status: AC
  Filled 2024-05-19: qty 30

## 2024-05-19 MED ORDER — CHLORHEXIDINE GLUCONATE CLOTH 2 % EX PADS: 6.0000 | MEDICATED_PAD | Freq: Once | CUTANEOUS | Status: DC

## 2024-05-19 MED ORDER — SUGAMMADEX SODIUM 200 MG/2ML IV SOLN
INTRAVENOUS | Status: DC | PRN
  Administered 2024-05-19 (×2): 200 mg via INTRAVENOUS

## 2024-05-19 MED ORDER — 0.9 % SODIUM CHLORIDE (POUR BTL) OPTIME
TOPICAL | Status: DC | PRN
  Administered 2024-05-19 (×2): 1000 mL

## 2024-05-19 MED ORDER — ACETAMINOPHEN 325 MG PO TABS: 650.0000 mg | ORAL_TABLET | ORAL | Status: DC | PRN

## 2024-05-19 MED ORDER — PROPOFOL 10 MG/ML IV BOLUS
INTRAVENOUS | Status: DC | PRN
  Administered 2024-05-19 (×2): 160 mg via INTRAVENOUS

## 2024-05-19 MED ORDER — FENTANYL CITRATE (PF) 100 MCG/2ML IJ SOLN
INTRAMUSCULAR | Status: AC
Start: 2024-05-19 — End: 2024-05-19
  Filled 2024-05-19: qty 2

## 2024-05-19 MED ORDER — PHENYLEPHRINE 80 MCG/ML (10ML) SYRINGE FOR IV PUSH (FOR BLOOD PRESSURE SUPPORT)
PREFILLED_SYRINGE | INTRAVENOUS | Status: AC
  Filled 2024-05-19: qty 10

## 2024-05-19 MED ORDER — BUPIVACAINE-EPINEPHRINE 0.25% -1:200000 IJ SOLN
INTRAMUSCULAR | Status: DC | PRN
  Administered 2024-05-19 (×2): 10 mL

## 2024-05-19 MED ORDER — LORAZEPAM 2 MG/ML PO CONC: 0.5000 mg | Freq: Once | ORAL | Status: DC

## 2024-05-19 MED ORDER — CHLORHEXIDINE GLUCONATE 0.12 % MT SOLN
15.0000 mL | Freq: Once | OROMUCOSAL | Status: AC
  Administered 2024-05-19 (×2): 15 mL via OROMUCOSAL
  Filled 2024-05-19: qty 15

## 2024-05-19 MED ORDER — ROCURONIUM BROMIDE 10 MG/ML (PF) SYRINGE
PREFILLED_SYRINGE | INTRAVENOUS | Status: AC
  Filled 2024-05-19: qty 10

## 2024-05-19 MED ORDER — FENTANYL CITRATE (PF) 100 MCG/2ML IJ SOLN
25.0000 ug | INTRAMUSCULAR | Status: DC | PRN
  Administered 2024-05-19: 25 ug via INTRAVENOUS
  Administered 2024-05-19 (×2): 50 ug via INTRAVENOUS
  Administered 2024-05-19: 25 ug via INTRAVENOUS
  Administered 2024-05-19 (×2): 50 ug via INTRAVENOUS

## 2024-05-19 MED ORDER — SODIUM CHLORIDE 0.9% FLUSH: 3.0000 mL | Freq: Two times a day (BID) | INTRAVENOUS | Status: DC

## 2024-05-19 MED ORDER — MIDAZOLAM HCL 2 MG/2ML IJ SOLN
INTRAMUSCULAR | Status: DC | PRN
  Administered 2024-05-19 (×2): 2 mg via INTRAVENOUS

## 2024-05-19 MED ORDER — AMISULPRIDE (ANTIEMETIC) 5 MG/2ML IV SOLN
10.0000 mg | Freq: Once | INTRAVENOUS | Status: AC
  Administered 2024-05-19 (×2): 10 mg via INTRAVENOUS

## 2024-05-19 MED ORDER — SODIUM CHLORIDE 0.9 % IV SOLN: 250.0000 mL | INTRAVENOUS | Status: DC | PRN

## 2024-05-19 MED ORDER — ONDANSETRON HCL 4 MG/2ML IJ SOLN
INTRAMUSCULAR | Status: DC | PRN
  Administered 2024-05-19 (×2): 4 mg via INTRAVENOUS

## 2024-05-19 MED ORDER — FENTANYL CITRATE (PF) 100 MCG/2ML IJ SOLN: 25.0000 ug | INTRAMUSCULAR | Status: DC | PRN

## 2024-05-19 MED ORDER — AMISULPRIDE (ANTIEMETIC) 5 MG/2ML IV SOLN
INTRAVENOUS | Status: AC
Start: 2024-05-19 — End: 2024-05-19
  Filled 2024-05-19: qty 4

## 2024-05-19 MED ORDER — ONDANSETRON HCL 4 MG/2ML IJ SOLN
INTRAMUSCULAR | Status: AC
  Filled 2024-05-19: qty 2

## 2024-05-19 MED ORDER — PHENYLEPHRINE 80 MCG/ML (10ML) SYRINGE FOR IV PUSH (FOR BLOOD PRESSURE SUPPORT)
PREFILLED_SYRINGE | INTRAVENOUS | Status: DC | PRN
  Administered 2024-05-19 (×6): 160 ug via INTRAVENOUS

## 2024-05-19 MED ORDER — ROCURONIUM BROMIDE 10 MG/ML (PF) SYRINGE
PREFILLED_SYRINGE | INTRAVENOUS | Status: DC | PRN
  Administered 2024-05-19 (×2): 70 mg via INTRAVENOUS

## 2024-05-19 MED ORDER — FENTANYL CITRATE (PF) 250 MCG/5ML IJ SOLN
INTRAMUSCULAR | Status: AC
  Filled 2024-05-19: qty 5

## 2024-05-19 MED ORDER — TRAMADOL HCL 50 MG PO TABS: 50.0000 mg | ORAL_TABLET | Freq: Four times a day (QID) | ORAL | Status: DC

## 2024-05-19 MED ORDER — LIDOCAINE 2% (20 MG/ML) 5 ML SYRINGE
INTRAMUSCULAR | Status: AC
Start: 2024-05-19 — End: 2024-05-19
  Filled 2024-05-19: qty 5

## 2024-05-19 MED ORDER — LACTATED RINGERS IV SOLN: INTRAVENOUS | Status: DC

## 2024-05-19 MED ORDER — LORAZEPAM 2 MG/ML IJ SOLN
0.5000 mg | Freq: Once | INTRAMUSCULAR | Status: AC
  Administered 2024-05-19 (×2): 0.5 mg via INTRAVENOUS

## 2024-05-19 SURGICAL SUPPLY — 62 items
BAG COUNTER SPONGE SURGICOUNT (BAG) ×1 IMPLANT
BAG DECANTER FOR FLEXI CONT (MISCELLANEOUS) ×1 IMPLANT
BINDER ABDOMINAL 12 ML 46-62 (SOFTGOODS) IMPLANT
BINDER BREAST LRG (GAUZE/BANDAGES/DRESSINGS) IMPLANT
BINDER BREAST XLRG (GAUZE/BANDAGES/DRESSINGS) IMPLANT
BIOPATCH RED 1 DISK 7.0 (GAUZE/BANDAGES/DRESSINGS) ×1 IMPLANT
CANISTER LIPO FAT HARVEST (MISCELLANEOUS) ×1 IMPLANT
CANISTER SUCTION 3000ML PPV (SUCTIONS) ×1 IMPLANT
CNTNR URN SCR LID CUP LEK RST (MISCELLANEOUS) IMPLANT
COVER SURGICAL LIGHT HANDLE (MISCELLANEOUS) ×1 IMPLANT
DERMABOND ADVANCED .7 DNX12 (GAUZE/BANDAGES/DRESSINGS) ×1 IMPLANT
DRAIN CHANNEL 19F RND (DRAIN) ×1 IMPLANT
DRAPE HALF SHEET 40X57 (DRAPES) ×2 IMPLANT
DRAPE SURG 17X23 STRL (DRAPES) ×4 IMPLANT
DRAPE SURG ORHT 6 SPLT 77X108 (DRAPES) ×2 IMPLANT
DRAPE WARM FLUID 44X44 (DRAPES) ×1 IMPLANT
DRESSING MEPILEX FLEX 4X4 (GAUZE/BANDAGES/DRESSINGS) IMPLANT
DRSG EMULSION OIL 3X3 NADH (GAUZE/BANDAGES/DRESSINGS) ×1 IMPLANT
ELECT CAUTERY BLADE 6.4 (BLADE) ×1 IMPLANT
ELECT NDL TIP 2.8 STRL (NEEDLE) IMPLANT
ELECT NEEDLE TIP 2.8 STRL (NEEDLE) IMPLANT
ELECTRODE BLDE 4.0 EZ CLN MEGD (MISCELLANEOUS) ×1 IMPLANT
ELECTRODE REM PT RTRN 9FT ADLT (ELECTROSURGICAL) ×1 IMPLANT
EVACUATOR SILICONE 100CC (DRAIN) ×1 IMPLANT
EXTRACTOR CANIST REVOLVE STRL (CANNISTER) IMPLANT
GAUZE 4X4 16PLY ~~LOC~~+RFID DBL (SPONGE) ×1 IMPLANT
GAUZE PAD ABD 8X10 STRL (GAUZE/BANDAGES/DRESSINGS) ×2 IMPLANT
GAUZE SPONGE 4X4 12PLY STRL (GAUZE/BANDAGES/DRESSINGS) ×1 IMPLANT
GLOVE BIO SURGEON STRL SZ 6.5 (GLOVE) ×1 IMPLANT
GLOVE BIOGEL M 6.5 STRL (GLOVE) ×4 IMPLANT
GOWN STRL REUS W/ TWL LRG LVL3 (GOWN DISPOSABLE) ×3 IMPLANT
KIT BASIN OR (CUSTOM PROCEDURE TRAY) ×1 IMPLANT
KIT TURNOVER KIT B (KITS) ×1 IMPLANT
MARKER SKIN DUAL TIP RULER LAB (MISCELLANEOUS) ×1 IMPLANT
NDL HYPO 25GX1X1/2 BEV (NEEDLE) ×1 IMPLANT
NDL SPNL 18GX3.5 QUINCKE PK (NEEDLE) IMPLANT
NEEDLE HYPO 25GX1X1/2 BEV (NEEDLE) ×1 IMPLANT
NEEDLE SPNL 18GX3.5 QUINCKE PK (NEEDLE) IMPLANT
NS IRRIG 1000ML POUR BTL (IV SOLUTION) ×2 IMPLANT
PACK GENERAL/GYN (CUSTOM PROCEDURE TRAY) ×1 IMPLANT
PAD ARMBOARD POSITIONER FOAM (MISCELLANEOUS) ×1 IMPLANT
PENCIL BUTTON HOLSTER BLD 10FT (ELECTRODE) ×1 IMPLANT
PENCIL SMOKE EVACUATOR (MISCELLANEOUS) ×1 IMPLANT
PIN SAFETY STERILE (MISCELLANEOUS) ×1 IMPLANT
SOL PREP POV-IOD 4OZ 10% (MISCELLANEOUS) ×1 IMPLANT
SPONGE T-LAP 18X18 ~~LOC~~+RFID (SPONGE) ×1 IMPLANT
STAPLER SKIN PROX 35W (STAPLE) IMPLANT
SUT MON AB 5-0 PS2 18 (SUTURE) ×2 IMPLANT
SUT PDS AB 2-0 CT1 27 (SUTURE) IMPLANT
SUT SILK 3 0 SH 30 (SUTURE) ×1 IMPLANT
SUT VIC AB 3-0 SH 27X BRD (SUTURE) ×3 IMPLANT
SUT VIC AB 4-0 PS2 18 (SUTURE) ×2 IMPLANT
SYR 10ML LL (SYRINGE) ×5 IMPLANT
SYR 50ML LL SCALE MARK (SYRINGE) IMPLANT
SYR BULB EAR ULCER 3OZ GRN STR (SYRINGE) IMPLANT
TOWEL GREEN STERILE (TOWEL DISPOSABLE) ×1 IMPLANT
TOWEL GREEN STERILE FF (TOWEL DISPOSABLE) ×1 IMPLANT
TRAY FOLEY MTR SLVR 14FR STAT (SET/KITS/TRAYS/PACK) IMPLANT
TUBE CONNECTING 20X1/4 (TUBING) ×1 IMPLANT
TUBING INFILTRATION IT-10001 (TUBING) ×1 IMPLANT
TUBING SET GRADUATE ASPIR 12FT (MISCELLANEOUS) ×1 IMPLANT
YANKAUER SUCT BULB TIP NO VENT (SUCTIONS) IMPLANT

## 2024-05-19 NOTE — Interval H&P Note (Signed)
 History and Physical Interval Note:  05/19/2024 7:21 AM  Marie Castro  has presented today for surgery, with the diagnosis of history of breast cancer.  The various methods of treatment have been discussed with the patient and family. After consideration of risks, benefits and other options for treatment, the patient has consented to  Procedure(s) with comments: REVISION, RECONSTRUCTION, BREAST (Left) LIPOSUCTION, WITH FAT TRANSFER (Left) INJECTION, FAT GRAFT (Left) - Left breast reconstruction with excision of scar contracture and fat grafting as a surgical intervention.  The patient's history has been reviewed, patient examined, no change in status, stable for surgery.  I have reviewed the patient's chart and labs.  Questions were answered to the patient's satisfaction.     Estefana RAMAN Zaidin Blyden

## 2024-05-19 NOTE — Anesthesia Procedure Notes (Signed)
 Procedure Name: Intubation Date/Time: 05/19/2024 7:43 AM  Performed by: Julien Manus, CRNAPre-anesthesia Checklist: Patient identified, Emergency Drugs available, Suction available and Patient being monitored Patient Re-evaluated:Patient Re-evaluated prior to induction Oxygen Delivery Method: Circle System Utilized Preoxygenation: Pre-oxygenation with 100% oxygen Induction Type: IV induction Ventilation: Mask ventilation without difficulty Laryngoscope Size: Mac and 3 Grade View: Grade I Tube type: Oral Tube size: 7.0 mm Number of attempts: 1 Airway Equipment and Method: Stylet and Oral airway Placement Confirmation: ETT inserted through vocal cords under direct vision, positive ETCO2 and breath sounds checked- equal and bilateral Secured at: 21 cm Tube secured with: Tape Dental Injury: Teeth and Oropharynx as per pre-operative assessment

## 2024-05-19 NOTE — Transfer of Care (Signed)
 Immediate Anesthesia Transfer of Care Note  Patient: Marie Castro  Procedure(s) Performed: REVISION, RECONSTRUCTION, BREAST (Left: Breast) LIPOSUCTION, WITH FAT TRANSFER (Left: Breast) INJECTION, FAT GRAFT (Left: Breast)  Patient Location: PACU  Anesthesia Type:General  Level of Consciousness: awake and alert   Airway & Oxygen Therapy: Patient Spontanous Breathing and Patient connected to face mask oxygen  Post-op Assessment: Report given to RN and Post -op Vital signs reviewed and stable  Post vital signs: Reviewed and stable  Last Vitals:  Vitals Value Taken Time  BP 141/85 05/19/24 09:07  Temp 36.4 C 05/19/24 09:05  Pulse 91 05/19/24 09:09  Resp 16 05/19/24 09:09  SpO2 100 % 05/19/24 09:09  Vitals shown include unfiled device data.  Last Pain:  Vitals:   05/19/24 0619  TempSrc:   PainSc: 0-No pain         Complications: No notable events documented.

## 2024-05-19 NOTE — Discharge Instructions (Addendum)

## 2024-05-19 NOTE — Op Note (Signed)
 DATE OF OPERATION: 05/19/2024  LOCATION: [facility]  PREOPERATIVE DIAGNOSIS: Left Breast Asymmetry and contracture after breast cancer treatment  POSTOPERATIVE DIAGNOSIS: Same  PROCEDURE:  Left breast capsulotomy 50 cm2. Fat grafting to left breast 130 cc  SURGEON: Estefana Fritter, DO  ASSISTANT: Honora Seip, PA  EBL: none  CONDITION: Stable  COMPLICATIONS: None  INDICATION: The patient, Marie Castro, is a 53 y.o. female born on July 01, 1971, is here for treatment of a left breast capsule contracture and asymmetry after cancer treatment.   PROCEDURE DETAILS:  The patient was seen prior to surgery and marked.  The IV antibiotics were given. The patient was taken to the operating room and given a general anesthetic. A standard time out was performed and all information was confirmed by those in the room. SCDs were placed.   The local was placed in the abdominal lower lateral area on each side 1 cm size.  The #15 blade was used to make an incision on each side.  The tumescent was placed in the abdominal fat.  The liposuction was then performed and the adipose collected in the Revolve.  It was prepared according to the manufacture guidelines and washed.  The #15 blade was used to make an incision in the inframammary fold of the lateral left breast.  Local was injected at the areolar area.  The forked knife was used to release 50 cm2 of scar contracture and capsule of the breast tissue of left breast.  The adipose was then injected into the area for contracture prevention and volume to assist with symmetry.  The breast was injected with 130 cc of adipose.  All incisions were closed with the 5-0 Monocryl.  There was a very nice release of the contracture. Derma bond and steri strips were placed with topifoam and ABD dressings. The patient was allowed to wake up and taken to recovery room in stable condition at the end of the case. The family was notified at the end of the  case.   The advanced practice practitioner (APP) assisted throughout the case.  The APP was essential in retraction and counter traction when needed to make the case progress smoothly.  This retraction and assistance made it possible to see the tissue plans for the procedure.  The assistance was needed for blood control, tissue re-approximation and assisted with closure of the incision site.

## 2024-05-19 NOTE — Anesthesia Preprocedure Evaluation (Addendum)
 Anesthesia Evaluation  Patient identified by MRN, date of birth, ID band Patient awake    Reviewed: Allergy & Precautions, NPO status , Patient's Chart, lab work & pertinent test results  History of Anesthesia Complications Negative for: history of anesthetic complications  Airway Mallampati: III  TM Distance: >3 FB Neck ROM: Full    Dental  (+) Caps, Dental Advisory Given   Pulmonary neg shortness of breath, neg sleep apnea, neg COPD, neg recent URI   breath sounds clear to auscultation       Cardiovascular negative cardio ROS  Rhythm:Regular     Neuro/Psych  Headaches PSYCHIATRIC DISORDERS Anxiety Depression       GI/Hepatic Neg liver ROS,,,  Endo/Other  Hypothyroidism    Renal/GU negative Renal ROS     Musculoskeletal negative musculoskeletal ROS (+)    Abdominal   Peds  Hematology negative hematology ROS (+) Lab Results      Component                Value               Date                      WBC                      4.6                 05/19/2024                HGB                      12.1                05/19/2024                HCT                      37.0                05/19/2024                MCV                      85.6                05/19/2024                PLT                      306                 05/19/2024              Anesthesia Other Findings   Reproductive/Obstetrics                              Anesthesia Physical Anesthesia Plan  ASA: 2  Anesthesia Plan: General   Post-op Pain Management: Ofirmev  IV (intra-op)*, Toradol  IV (intra-op)* and Precedex   Induction: Intravenous  PONV Risk Score and Plan: 4 or greater and Ondansetron , Dexamethasone , Midazolam  and Propofol  infusion  Airway Management Planned: Oral ETT  Additional Equipment: None  Intra-op Plan:   Post-operative Plan: Extubation in OR  Informed Consent: I have reviewed the patients  History and Physical, chart, labs and discussed the procedure  including the risks, benefits and alternatives for the proposed anesthesia with the patient or authorized representative who has indicated his/her understanding and acceptance.     Dental advisory given  Plan Discussed with: CRNA  Anesthesia Plan Comments:          Anesthesia Quick Evaluation

## 2024-05-20 ENCOUNTER — Encounter: Payer: Self-pay | Admitting: Plastic Surgery

## 2024-05-20 ENCOUNTER — Encounter (HOSPITAL_COMMUNITY): Payer: Self-pay | Admitting: Plastic Surgery

## 2024-05-21 ENCOUNTER — Telehealth: Payer: Self-pay

## 2024-05-21 NOTE — Telephone Encounter (Signed)
 I called the patient in regards to her MyChart message. I answered all questions to her satisfaction and told her to call us  if she had any further concerns or questions.

## 2024-05-21 NOTE — Anesthesia Postprocedure Evaluation (Signed)
 Anesthesia Post Note  Patient: Marie Castro  Procedure(s) Performed: REVISION, RECONSTRUCTION, BREAST (Left: Breast) LIPOSUCTION, WITH FAT TRANSFER (Left: Breast) INJECTION, FAT GRAFT (Left: Breast)     Patient location during evaluation: PACU Anesthesia Type: General Level of consciousness: awake and alert Pain management: pain level controlled Vital Signs Assessment: post-procedure vital signs reviewed and stable Respiratory status: spontaneous breathing, nonlabored ventilation and respiratory function stable Cardiovascular status: blood pressure returned to baseline and stable Postop Assessment: no apparent nausea or vomiting Anesthetic complications: no   No notable events documented.                  Azan Maneri

## 2024-05-27 ENCOUNTER — Ambulatory Visit: Admitting: Plastic Surgery

## 2024-05-27 ENCOUNTER — Encounter: Payer: Self-pay | Admitting: Plastic Surgery

## 2024-05-27 VITALS — BP 142/85 | HR 91 | Ht 65.5 in | Wt 160.0 lb

## 2024-05-27 DIAGNOSIS — N651 Disproportion of reconstructed breast: Secondary | ICD-10-CM

## 2024-05-27 DIAGNOSIS — D0510 Intraductal carcinoma in situ of unspecified breast: Secondary | ICD-10-CM

## 2024-05-27 NOTE — Progress Notes (Signed)
   Subjective:    Patient ID: Marie Castro, female    DOB: 06-25-1971, 53 y.o.   MRN: [id number]  The patient is a 53 year old female here for follow-up after undergoing surgery 2 weeks ago.  She had release of the left breast with fat grafting to try and improve the overall softness and look of the Breast.  Bruising and swelling as expected.  No sign of infection      Review of Systems  Constitutional: Negative.   Eyes: Negative.   Respiratory: Negative.    Cardiovascular: Negative.   Genitourinary: Negative.        Objective:   Physical Exam Constitutional:      Appearance: Normal appearance.  Cardiovascular:     Rate and Rhythm: Normal rate.     Pulses: Normal pulses.  Skin:    Capillary Refill: Capillary refill takes less than 2 seconds.  Neurological:     Mental Status: She is oriented to person, place, and time.  Psychiatric:        Mood and Affect: Mood normal.        Behavior: Behavior normal.        Thought Content: Thought content normal.        Assessment & Plan:     ICD-10-CM   1. Ductal carcinoma in situ (DCIS) of breast, unspecified laterality  D05.10     2. Breast asymmetry following reconstructive surgery  N65.1        Continue with the spanks and she can go into a sports bra.  It does not have to be extremely tight just with some support.  Will get pictures at her next visit.  She is got a little bit of a skin breakdown at the nipple areola where I really worked to get it released.  She does have fat under there so hopefully this will not scar back down.  I would like her to do Vaseline and keep it covered daily also clean it with Vashe.

## 2024-05-28 ENCOUNTER — Ambulatory Visit
Admission: RE | Admit: 2024-05-28 | Discharge: 2024-05-28 | Disposition: A | Discharge status: Home / Self Care | Source: Ambulatory Visit | Attending: Physician Assistant | Admitting: Physician Assistant

## 2024-05-28 ENCOUNTER — Encounter: Payer: Self-pay | Admitting: Physician Assistant

## 2024-05-28 ENCOUNTER — Other Ambulatory Visit: Payer: Self-pay | Admitting: Physician Assistant

## 2024-05-28 DIAGNOSIS — R0781 Pleurodynia: Secondary | ICD-10-CM

## 2024-05-28 MED ORDER — IOPAMIDOL (ISOVUE-370) INJECTION 76%
80.0000 mL | Freq: Once | INTRAVENOUS | Status: AC | PRN
Start: 2024-05-28 — End: 2024-05-28
  Administered 2024-05-28: 80 mL via INTRAVENOUS

## 2024-05-29 ENCOUNTER — Ambulatory Visit: Admitting: Psychiatry

## 2024-06-02 ENCOUNTER — Encounter: Payer: Self-pay | Admitting: Plastic Surgery

## 2024-06-03 ENCOUNTER — Telehealth (INDEPENDENT_AMBULATORY_CARE_PROVIDER_SITE_OTHER): Admitting: Adult Health

## 2024-06-03 ENCOUNTER — Encounter: Payer: Self-pay | Admitting: Adult Health

## 2024-06-03 DIAGNOSIS — F331 Major depressive disorder, recurrent, moderate: Secondary | ICD-10-CM

## 2024-06-03 DIAGNOSIS — F902 Attention-deficit hyperactivity disorder, combined type: Secondary | ICD-10-CM | POA: Diagnosis not present

## 2024-06-03 DIAGNOSIS — F411 Generalized anxiety disorder: Secondary | ICD-10-CM

## 2024-06-03 DIAGNOSIS — G4700 Insomnia, unspecified: Secondary | ICD-10-CM | POA: Diagnosis not present

## 2024-06-03 MED ORDER — LISDEXAMFETAMINE DIMESYLATE 40 MG PO CAPS: 40.0000 mg | ORAL_CAPSULE | ORAL | 0 refills | Status: DC

## 2024-06-03 MED ORDER — LORAZEPAM 0.5 MG PO TABS: 0.5000 mg | ORAL_TABLET | Freq: Two times a day (BID) | ORAL | 2 refills | Status: DC | PRN

## 2024-06-03 MED ORDER — VORTIOXETINE HBR 20 MG PO TABS: 20.0000 mg | ORAL_TABLET | Freq: Every day | ORAL | 2 refills | Status: DC

## 2024-06-03 MED ORDER — HYDROXYZINE HCL 25 MG PO TABS: ORAL_TABLET | ORAL | 2 refills | Status: DC

## 2024-06-03 NOTE — Progress Notes (Signed)
 Marie Castro [id number] [birth year] 53 y.o.  Virtual Visit via Video Note  I connected with pt @ on 06/03/24 at  1:00 PM EDT by a video enabled telemedicine application and verified that I am speaking with the correct person using two identifiers.   I discussed the limitations of evaluation and management by telemedicine and the availability of in person appointments. The patient expressed understanding and agreed to proceed.  I discussed the assessment and treatment plan with the patient. The patient was provided an opportunity to ask questions and all were answered. The patient agreed with the plan and demonstrated an understanding of the instructions.   The patient was advised to call back or seek an in-person evaluation if the symptoms worsen or if the condition fails to improve as anticipated.  I provided 25 minutes of non-face-to-face time during this encounter.  The patient was located at home.  The provider was located at [facility].   Angeline LOISE Sayers, NP   Subjective:   Patient ID:  Marie Castro is a 53 y.o. (DOB 1971/05/10) female.  Chief Complaint: No chief complaint on file.   HPI [name] presents for follow-up of MDD, GAD insomnia and ADD.  Describes mood today as ok. Pleasant. Reports decreased tearfulness. Mood symptoms - reports decreased depression and anxiety. Reports situational irritability. Reports improved interest and motivation. Denies panic attacks. Denies worry, rumination and over thinking. Denies obsessive thoughts or acts. Reports working through grief - loss of mother. Plans to start working with a therapist. Recovering from knee surgery - making progress. Reports mood as stable. Stating I'm still dealing with a lot of different, but I feel like I can deal with it. Feels like current medication regimen is helpful to manage mood symptoms. Taking medications as prescribed.  Energy levels improved. Active, does not have a regular exercise  routine with current physical disabilities. Enjoys some usual interests and activities. Married x 22 years. Has 2 children. Family local. Spending time with family. Appetite improved. Reports weight stable - 159 pounds Reports difficulties getting to sleep - getting to bed earlier. Averages 5 hours. Has tried Ativan  as needed - willing to consider other options. Focus and concentration stable - taking Vyvanse  at 7:30 am. Completing tasks. Managing aspects of household. Working for time [employer]. Denies SI or HI.  Denies AH or VH. Denies self harm. Denies substance use.  Previous medication trials: Effexor , Trazadone   Review of Systems:  Review of Systems  Musculoskeletal:  Negative for gait problem.  Neurological:  Negative for tremors.  Psychiatric/Behavioral:         Please refer to HPI    Medications: I have reviewed the patient's current medications.  Current Outpatient Medications  Medication Sig Dispense Refill   hydrOXYzine  (ATARAX ) 25 MG tablet Take one to two tablets at bedtime as needed for sleep. 60 tablet 2   [START ON 07/01/2024] lisdexamfetamine (VYVANSE ) 40 MG capsule Take 1 capsule (40 mg total) by mouth every morning. 30 capsule 0   [START ON 07/29/2024] lisdexamfetamine (VYVANSE ) 40 MG capsule Take 1 capsule (40 mg total) by mouth every morning. 30 capsule 0   [employer] D3 25 MCG (1000 UT) capsule TAKE 1 CAPSULE BY MOUTH EVERY DAY 120 capsule 4   diphenhydrAMINE  (BENADRYL ) 25 mg capsule Take one capsule (25 mg dose) by mouth every 6 (six) hours as needed for Itching.     docusate sodium (COLACE) 100 MG capsule Take 100 mg by mouth daily.  eletriptan (RELPAX) 40 MG tablet Take 1 tablet (40 mg total) by mouth as needed for migraine or headache. May repeat in 2 hours if headache persists or recurs.     famotidine (PEPCID) 20 MG tablet Take 20 mg by mouth daily. AC     ibuprofen (ADVIL,MOTRIN) 200 MG tablet Take 400-600 mg by mouth daily as needed for headache or  moderate pain.     levocetirizine (XYZAL) 5 MG tablet Take 5 mg by mouth daily as needed for allergies.     levothyroxine (SYNTHROID) 112 MCG tablet Take 112 mcg by mouth every morning.     lisdexamfetamine (VYVANSE ) 40 MG capsule Take 1 capsule (40 mg total) by mouth every morning. 30 capsule 0   LORazepam  (ATIVAN ) 0.5 MG tablet Take 1 tablet (0.5 mg total) by mouth 2 (two) times daily as needed for anxiety. 60 tablet 2   ondansetron  (ZOFRAN ) 4 MG tablet Take 1 tablet (4 mg total) by mouth every 8 (eight) hours as needed for nausea or vomiting. (Patient not taking: Reported on 05/27/2024) 20 tablet 0   ondansetron  (ZOFRAN -ODT) 8 MG disintegrating tablet TAKE 1 TABLET (8 MG TOTAL) BY MOUTH EVERY 8 (EIGHT) HOURS AS NEEDED FOR NAUSEA OR VOMITING. 18 tablet 3   vortioxetine  HBr (TRINTELLIX ) 20 MG TABS tablet Take 1 tablet (20 mg total) by mouth daily. 30 tablet 2   zoledronic acid (RECLAST) 5 MG/100ML SOLN injection Infuse 100 mLs (5 mg total) into the vein once. Every other year for preventative     No current facility-administered medications for this visit.    Medication Side Effects: None  Allergies:  Allergies  Allergen Reactions   Latex Itching, Rash and Other (See Comments)    Pt states rubber bands from her braces causes ulcers in her mouth  mouth ulcers   Oxycodone  Rash and Other (See Comments)    Pt states could not sleep at all, was up 24 hrs     Celecoxib  Other (See Comments)    GI Upset (intolerance)    Past Medical History:  Diagnosis Date   Anxiety    Cancer (HCC)    breast   Depression    Family history of breast cancer    Family history of heart disease    Family history of prostate cancer    History of radiation therapy 10/04/17-11/22/17   left breast 50.4 Gy in 28 fractions, left breast boost 10 Gy in 5 fractions, right breast 50.4 Gy in 28 fractions, right breast boost 12 Gy in 6 fractions   Hypothyroidism    Idiopathic urticaria    Malignant neoplasm of  upper-outer quadrant of left female breast (HCC)    Migraine    Overweight    Thyroid disease     Family History  Problem Relation Age of Onset   Cancer Mother        SKIN AND BREAST   Heart disease Mother    Heart disease Father    Hyperlipidemia Father    Depression Father    Cancer Maternal Grandmother        BREAST   Melanoma Maternal Grandmother        toe   Cancer Maternal Grandfather        PROSTATE   Irritable bowel syndrome Paternal Grandmother    Heart disease Paternal Grandfather    Breast cancer Maternal Aunt 72   Breast cancer Maternal Aunt        dx in her 60s    Social  History   Socioeconomic History   Marital status: Married    Spouse name: Not on file   Number of children: 2   Years of education: Not on file   Highest education level: Not on file  Occupational History   Not on file  Tobacco Use   Smoking status: Never   Smokeless tobacco: Never  Vaping Use   Vaping status: Never Used  Substance and Sexual Activity   Alcohol use: No   Drug use: No   Sexual activity: Yes  Other Topics Concern   Not on file  Social History Narrative   Not on file   Social Drivers of Health   Financial Resource Strain: Not on file  Food Insecurity: No Food Insecurity (09/28/2022)   Received from [facility]   Hunger Vital Sign    Within the past 12 months, you worried that your food would run out before you got the money to buy more.: Never true    Within the past 12 months, the food you bought just didn't last and you didn't have money to get more.: Never true  Transportation Needs: Not on file  Physical Activity: Not on file  Stress: Not on file  Social Connections: Unknown (09/28/2022)   Received from [facility]   Social Network    Social Network: Not on file  Intimate Partner Violence: Unknown (09/28/2022)   Received from [facility]   HITS    Physically Hurt: Not on file    Insult or Talk Down To: Not on file    Threaten Physical Harm:  Not on file    Scream or Curse: Not on file    Past Medical History, Surgical history, Social history, and Family history were reviewed and updated as appropriate.   Please see review of systems for further details on the patient's review from today.   Objective:   Physical Exam:  LMP 04/08/2018   Physical Exam Constitutional:      General: She is not in acute distress. Musculoskeletal:        General: No deformity.  Neurological:     Mental Status: She is alert and oriented to person, place, and time.     Coordination: Coordination normal.  Psychiatric:        Attention and Perception: Attention and perception normal. She does not perceive auditory or visual hallucinations.        Mood and Affect: Mood normal. Mood is not anxious or depressed. Affect is not labile, blunt, angry or inappropriate.        Speech: Speech normal.        Behavior: Behavior normal.        Thought Content: Thought content normal. Thought content is not paranoid or delusional. Thought content does not include homicidal or suicidal ideation. Thought content does not include homicidal or suicidal plan.        Cognition and Memory: Cognition and memory normal.        Judgment: Judgment normal.     Comments: Insight intact     Lab Review:     Component Value Date/Time   NA 137 07/13/2023 1542   K 4.5 07/13/2023 1542   CL 107 07/13/2023 1542   CO2 28 07/13/2023 1542   GLUCOSE 89 07/13/2023 1542   BUN 21 (H) 07/13/2023 1542   CREATININE 0.94 07/13/2023 1542   CALCIUM 8.6 (L) 07/13/2023 1542   PROT 5.1 (L) 07/13/2023 1542   ALBUMIN 3.3 (L) 07/13/2023 1542   AST  17 07/13/2023 1542   ALT 11 07/13/2023 1542   ALKPHOS 33 (L) 07/13/2023 1542   BILITOT 0.2 (L) 07/13/2023 1542   GFRNONAA >60 07/13/2023 1542   GFRAA >60 06/08/2020 1607   GFRAA >60 11/13/2017 1512       Component Value Date/Time   WBC 4.6 05/19/2024 0630   RBC 4.32 05/19/2024 0630   HGB 12.1 05/19/2024 0630   HGB 12.9 07/11/2022  1411   HCT 37.0 05/19/2024 0630   PLT 306 05/19/2024 0630   PLT 298 07/11/2022 1411   MCV 85.6 05/19/2024 0630   MCH 28.0 05/19/2024 0630   MCHC 32.7 05/19/2024 0630   RDW 12.7 05/19/2024 0630   LYMPHSABS 1.9 07/13/2023 1542   MONOABS 0.6 07/13/2023 1542   EOSABS 0.8 (H) 07/13/2023 1542   BASOSABS 0.1 07/13/2023 1542    No results found for: POCLITH, LITHIUM   No results found for: PHENYTOIN, PHENOBARB, VALPROATE, CBMZ   .res Assessment: Plan:    Plan:  PDMP reviewed  Add Hydroxyzine  25mg  - 1 to 2 at bedtime as needed for sleep  Trintellix  20mg  daily  Lorazepam  0.5mg  twice daily as needed  Vyvanse  40mg  daily  RTC 2 months  Patient advised to contact office with any questions, adverse effects, or acute worsening in signs and symptoms.   25 minutes spent dedicated to the care of this patient on the date of this encounter to include pre-visit review of records, ordering of medication, post visit documentation, and face-to-face time with the patient discussing depression and anxiety. Discussed continuing current medication regimen.  Discussed potential benefits, risk, and side effects of benzodiazepines to include potential risk of tolerance and dependence, as well as possible drowsiness.  Advised patient not to drive if experiencing drowsiness and to take lowest possible effective dose to minimize risk of dependence and tolerance.   Discussed potential benefits, risks, and side effects of stimulants with patient to include increased heart rate, palpitations, insomnia, increased anxiety, increased irritability, or decreased appetite.  Instructed patient to contact office if experiencing any significant tolerability issues.  Diagnoses and all orders for this visit:  Attention deficit hyperactivity disorder (ADHD), combined type -     lisdexamfetamine (VYVANSE ) 40 MG capsule; Take 1 capsule (40 mg total) by mouth every morning. -     lisdexamfetamine (VYVANSE ) 40  MG capsule; Take 1 capsule (40 mg total) by mouth every morning. -     lisdexamfetamine (VYVANSE ) 40 MG capsule; Take 1 capsule (40 mg total) by mouth every morning.  Major depressive disorder, recurrent episode, moderate (HCC) -     vortioxetine  HBr (TRINTELLIX ) 20 MG TABS tablet; Take 1 tablet (20 mg total) by mouth daily.  Generalized anxiety disorder -     LORazepam  (ATIVAN ) 0.5 MG tablet; Take 1 tablet (0.5 mg total) by mouth 2 (two) times daily as needed for anxiety.  Insomnia, unspecified type -     hydrOXYzine  (ATARAX ) 25 MG tablet; Take one to two tablets at bedtime as needed for sleep.     Please see After Visit Summary for patient specific instructions.  Future Appointments  Date Time Provider Department Center  06/11/2024  3:45 PM Scheeler, Donnice PARAS, PA-C [facility] None  06/18/2024 11:00 AM Sherlynn Sober, LCSW [facility] None  07/14/2024  1:00 PM [facility] None  07/14/2024  1:30 PM Iruku, Amber, MD [facility] None    No orders of the defined types were placed in this encounter.     -------------------------------

## 2024-06-04 ENCOUNTER — Telehealth: Payer: Self-pay | Admitting: Hematology and Oncology

## 2024-06-04 NOTE — Telephone Encounter (Signed)
 Marie Castro has re-scheduled for 10/10 as this date works best for her work schedule.

## 2024-06-11 ENCOUNTER — Encounter: Admitting: Surgical

## 2024-06-11 ENCOUNTER — Ambulatory Visit (INDEPENDENT_AMBULATORY_CARE_PROVIDER_SITE_OTHER): Admitting: Student

## 2024-06-11 VITALS — BP 114/75 | HR 79

## 2024-06-11 DIAGNOSIS — N651 Disproportion of reconstructed breast: Secondary | ICD-10-CM

## 2024-06-11 NOTE — Progress Notes (Signed)
 The patient is a 53 year old female with history of breast cancer status post bilateral partial mastectomies followed by bilateral radiation in 2018.  Patient most recently underwent left breast capsulectomy and fat grafting to the left breast with Dr. Lowery on 05/19/2024.  Patient is a little over 3 weeks postop.  She presents to the clinic today for postoperative follow-up.  Patient was most recently seen by Dr. Lowery on 05/27/2024.  At this visit, there was bruising and swelling as expected, no signs of infection.  It is recommended that patient continue with spanks and sports bra.  It was noted that patient did have a little bit of skin breakdown at the nipple areola.  It was recommended that she apply Vaseline to this area and keep it covered daily and also clean it with Vashe.  Today, patient reports she is overall doing well.  She states that the soreness to her abdomen has been resolving.  She states that she has been cleaning her wound with Vashe and applying Vaseline daily.  She otherwise denies any new issues or concerns.  She does not report any fevers or chills.  Chaperone present on exam.  On exam, patient is sitting upright in no acute distress.  Left breast is overall soft, there is some firmness noted out laterally, consistent with fat necrosis.  There does appear to be a little bit of irritation to the skin, consistent with dermatitis from her Band-Aid.  There is a small wound noted near the NAC with exudate noted throughout the wound.  There is no drainage on exam.  There are no fluid collections palpated on exam.  Abdomen is soft and nontender.  Incisions are intact with Monocryl sutures, these were cut and removed.  Patient tolerated well.    I discussed with the patient that I would like her to continue to clean her wound with Vashe and apply Vaseline and a nonstick dressing.  I discussed with her that she can either use Mepilex dressings or avoid adhesives altogether given  irritation to her skin.  She expressed understanding.  I discussed with her to gently massage the area of firmness.  I recommended that she keep a close eye on the overlying skin.  I discussed with her that if the becomes more red, painful if she develops any fevers or chills she needs to let us  know immediately.  She expressed understanding.  I discussed with the patient to continue with compression at all times to her abdomen and her breasts.  She expressed understanding  Patient to follow back up in 2 weeks.  I instructed her to call in the meantime if she has any questions or concerns about anything.  Pictures were obtained of the patient and placed in the chart with the patient's or guardian's permission.

## 2024-06-18 ENCOUNTER — Ambulatory Visit (INDEPENDENT_AMBULATORY_CARE_PROVIDER_SITE_OTHER): Admitting: Psychiatry

## 2024-06-18 DIAGNOSIS — F331 Major depressive disorder, recurrent, moderate: Secondary | ICD-10-CM

## 2024-06-18 NOTE — Progress Notes (Unsigned)
 [facility] Initial Adult Exam  Name: Marie Castro Date: 06/18/2024 MRN: [id number] DOB: 1971/05/12 PCP: Alvera Reagin, PA  Time spent: 60 minutes   Guardian/Payee:  patient    Paperwork requested:  No   Reason for Visit /Presenting Problem: depression, tearfulness, anxiety, loss of mom in Feb. 2025, recently had 2 surgeries, husband lost job recently in [employer], wants to strengthen the marital relationship, life changes ahead   Mental Status Exam:    Appearance:   Casual, Neat, and Well Groomed     Behavior:  Appropriate, Sharing, and Motivated  Motor:  Normal  Speech/Language:   Clear and Coherent  Affect:  Depressed and anxiety  Mood:  anxious and depressed  Thought process:  goal directed  Thought content:    Rumination  Sensory/Perceptual disturbances:    WNL  Orientation:  oriented to person, place, time/date, situation, day of week, month of year, year, and stated date of Sept. 10, 2025  Attention:  Good  Concentration:  Good  Memory:  WNL  Fund of knowledge:   Good  Insight:    Good  Judgment:   Good  Impulse Control:  Good   Reported Symptoms:  see above symptoms  Risk Assessment: Danger to Self:  No Self-injurious Behavior: No Danger to Others: No Duty to Warn:no Physical Aggression / Violence:No  Access to Firearms a concern: No  Gang Involvement:No  Patient / guardian was educated about steps to take if suicide or homicide risk level increases between visits: yes While future psychiatric events cannot be accurately predicted, the patient does not currently require acute inpatient psychiatric care and does not currently meet [place]  involuntary commitment criteria.  Substance Abuse History: Current substance abuse: No     Past Psychiatric History:   Previous psychological history is significant for anxiety, depression, and medication by Regina Mozingo, PA-C Outpatient Providers:[facility]  History of Psych  Hospitalization: No  Psychological Testing: n/a   Abuse History: Victim of No., n/a   Report needed: No. Victim of Neglect:No. Perpetrator of no  Witness / Exposure to Domestic Violence: No   Protective Services Involvement: No  Witness to [employer] Violence:  No   Family History: Patient confirms information below. Family History  Problem Relation Age of Onset   Cancer Mother        SKIN AND BREAST   Heart disease Mother    Heart disease Father    Hyperlipidemia Father    Depression Father    Cancer Maternal Grandmother        BREAST   Melanoma Maternal Grandmother        toe   Cancer Maternal Grandfather        PROSTATE   Irritable bowel syndrome Paternal Grandmother    Heart disease Paternal Grandfather    Breast cancer Maternal Aunt 72   Breast cancer Maternal Aunt        dx in her 50s    Living situation: the patient lives with their spouse and daughter(senior in HS) and son Forensic scientist in Human resources officer)  Sexual Orientation:  Straight  Relationship Status: married and in healthy relationship Name of spouse / other: n/a             If a parent, number of children / ages: 2 ages are 62 and 67  Support Systems; spouse friends Sister supportive and lives Therapist, nutritional Stress:  No  Tentative as husband just lost job   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 Service: No   Educational History: Education: post college graduate work or degree  Religion/Sprituality/World View:   Protestant and very important in her life  Any cultural differences that may affect / interfere with treatment:  not applicable   Recreation/Hobbies: rest when I need to, tv and movies, crafting , playing with her dogs (black and red)  Stressors:Financial difficulties   Loss of mom in  2/25    Strengths:  Supportive Relationships, Family, Friends, Warehouse manager, Spirituality, Hopefulness, Journalist, newspaper, and Able to Communicate  Effectively  Barriers:  stress, emotional issues, balanced thinking  Legal History: Pending legal issue / charges: The patient has no significant history of legal issues. History of legal issue / charges: none  Medical History/Surgical History:reviewed and patient confirms info. Past Medical History:  Diagnosis Date   Anxiety    Cancer (HCC)    breast   Depression    Family history of breast cancer    Family history of heart disease    Family history of prostate cancer    History of radiation therapy 10/04/17-11/22/17   left breast 50.4 Gy in 28 fractions, left breast boost 10 Gy in 5 fractions, right breast 50.4 Gy in 28 fractions, right breast boost 12 Gy in 6 fractions   Hypothyroidism    Idiopathic urticaria    Malignant neoplasm of upper-outer quadrant of left female breast (HCC)    Migraine    Overweight    Thyroid disease     Past Surgical History:  Procedure Laterality Date   BREAST BIOPSY Right 07/12/2017   BREAST LUMPECTOMY WITH RADIOACTIVE SEED AND SENTINEL LYMPH NODE BIOPSY Right 08/16/2017   Procedure: RIGHT BREAST LUMPECTOMY WITH RADIOACTIVE SEED AND RIGHT SENTINEL LYMPH NODE BIOPSY;  Surgeon: Aron Shoulders, MD;  Location: [facility] OR;  Service: General;  Laterality: Right;   BREAST LUMPECTOMY WITH RADIOACTIVE SEED LOCALIZATION Left 08/16/2017   Procedure: LEFT BREAST PARTIAL MASTECTOMY WITH BRACKETED RADIOACTIVE SEED LOCALIZATION;  Surgeon: Aron Shoulders, MD;  Location: [facility] OR;  Service: General;  Laterality: Left;   DILATION AND CURETTAGE OF UTERUS     FAT INJECTION Left 05/19/2024   Procedure: INJECTION, FAT GRAFT;  Surgeon: Lowery Estefana RAMAN, DO;  Location: [facility] OR;  Service: Plastics;  Laterality: Left;  Left breast reconstruction with excision of scar contracture and fat grafting   KNEE SURGERY     LIPOSUCTION WITH LIPOFILLING Left 05/19/2024   Procedure: LIPOSUCTION, WITH FAT TRANSFER;  Surgeon: Lowery Estefana RAMAN, DO;  Location: [facility] OR;  Service: Plastics;   Laterality: Left;   MULTIPLE TOOTH EXTRACTIONS     RE-EXCISION OF BREAST LUMPECTOMY Left 08/27/2017   Procedure: LEFT RE-EXCISION OF BREAST LUMPECTOMY ERAS PATHWAY;  Surgeon: Aron Shoulders, MD;  Location: [facility];  Service: General;  Laterality: Left;   REVISION, RECONSTRUCTION, BREAST Left 05/19/2024   Procedure: REVISION, RECONSTRUCTION, BREAST;  Surgeon: Lowery Estefana RAMAN, DO;  Location: [facility] OR;  Service: Plastics;  Laterality: Left;   WISDOM TOOTH EXTRACTION      Medications: Patient confirms listing: Current Outpatient Medications  Medication Sig Dispense Refill   [employer] D3 25 MCG (1000 UT) capsule TAKE 1 CAPSULE BY MOUTH EVERY DAY 120 capsule 4   diphenhydrAMINE  (BENADRYL ) 25 mg capsule Take one capsule (25 mg dose) by mouth every 6 (six) hours as needed for Itching.     docusate sodium (COLACE) 100 MG capsule Take 100 mg by mouth daily.     eletriptan (RELPAX) 40 MG tablet Take 1  tablet (40 mg total) by mouth as needed for migraine or headache. May repeat in 2 hours if headache persists or recurs.     famotidine (PEPCID) 20 MG tablet Take 20 mg by mouth daily. AC     hydrOXYzine  (ATARAX ) 25 MG tablet Take one to two tablets at bedtime as needed for sleep. 60 tablet 2   ibuprofen (ADVIL,MOTRIN) 200 MG tablet Take 400-600 mg by mouth daily as needed for headache or moderate pain.     levocetirizine (XYZAL) 5 MG tablet Take 5 mg by mouth daily as needed for allergies.     levothyroxine (SYNTHROID) 112 MCG tablet Take 112 mcg by mouth every morning.     lisdexamfetamine (VYVANSE ) 40 MG capsule Take 1 capsule (40 mg total) by mouth every morning. 30 capsule 0   [START ON 07/01/2024] lisdexamfetamine (VYVANSE ) 40 MG capsule Take 1 capsule (40 mg total) by mouth every morning. 30 capsule 0   [START ON 07/29/2024] lisdexamfetamine (VYVANSE ) 40 MG capsule Take 1 capsule (40 mg total) by mouth every morning. 30 capsule 0   LORazepam  (ATIVAN ) 0.5 MG tablet Take 1 tablet (0.5 mg  total) by mouth 2 (two) times daily as needed for anxiety. 60 tablet 2   ondansetron  (ZOFRAN ) 4 MG tablet Take 1 tablet (4 mg total) by mouth every 8 (eight) hours as needed for nausea or vomiting. (Patient not taking: Reported on 06/11/2024) 20 tablet 0   ondansetron  (ZOFRAN -ODT) 8 MG disintegrating tablet TAKE 1 TABLET (8 MG TOTAL) BY MOUTH EVERY 8 (EIGHT) HOURS AS NEEDED FOR NAUSEA OR VOMITING. 18 tablet 3   vortioxetine  HBr (TRINTELLIX ) 20 MG TABS tablet Take 1 tablet (20 mg total) by mouth daily. 30 tablet 2   zoledronic acid (RECLAST) 5 MG/100ML SOLN injection Infuse 100 mLs (5 mg total) into the vein once. Every other year for preventative     No current facility-administered medications for this visit.    Allergies  Allergen Reactions   Latex Itching, Rash and Other (See Comments)    Pt states rubber bands from her braces causes ulcers in her mouth  mouth ulcers   Oxycodone  Rash and Other (See Comments)    Pt states could not sleep at all, was up 24 hrs     Celecoxib  Other (See Comments)    GI Upset (intolerance)    Diagnoses:    ICD-10-CM   1. Major depressive disorder, recurrent episode, moderate (HCC)  F33.1      Treatment goal plan of care: Worked with patient collaboratively on her treatment plan and she is in agreement with it.  Her goals will remain on treatment plan as patient works with strategies in sessions and outside of sessions to meet her goals.  Patient is signing a copy of her printed treatment goal plan instead of signing online.  A copy will also be kept here at the office.  Progress will be assessed each session and documented in the progress or plan sections of treatment note. 1.  Identify life conflicts and/or situations including from the past and the present, that support current symptomology of anxiety/depression. 2. Develop behavioral and cognitive strategies to reduce or eliminate excessive anxiety, depression symptoms.  Identify, challenge, and  replace negative self-talk with more positive, realistic, and empowering self-talk. 3.  Patient will work on developing reality based, positive cognitive messages that can help her improve her mood and outlook while also working on Environmental consultant.   Plan of Care:  This is first appointment with  this patient for therapy and today we collaboratively completed her initial evaluation and initial treatment goal plan.  Tayva Easterday is a 53 year old, married, mother of 2 children , one in high school, and one in college. Parents passed away in 07-04-2021 and 07-04-24 so within a close time frame. 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 Anxiety got worse after parent's death. Husband recently lost job after 20-plus years. Anxious, depressed. Some hurt. Still grieving loss of parents. Has several dogs who are a comfort to patient and family. Prior help for managing food in healthier ways and not using food for comfort. Faith is a very important part of her life. Tends to take everything personal. Hard to let go even when things are out of her control. Quick to get mad and yell at times. Current struggles affecting the whole family.     Has one sister that lives locally and is close with patient.   What is she seeking currently- likes faith included in her therapy Gets hope and guidance through her faith.     ---------------------------------------------------------------------------------------------------------------------  Patient actively involved in initial session today sharing helpful information and also determining her treatment goals.  Other information gathered during this initial meeting with patient that help support her need for therapy, can be found in the above sections of this initial evaluation.  Review of initial treatment goals.  To return for next appointment within 2 weeks.  Barnie Bunde, LCSW

## 2024-06-27 ENCOUNTER — Encounter: Admitting: Plastic Surgery

## 2024-07-01 ENCOUNTER — Ambulatory Visit (INDEPENDENT_AMBULATORY_CARE_PROVIDER_SITE_OTHER): Admitting: Plastic Surgery

## 2024-07-01 VITALS — BP 116/75 | HR 73

## 2024-07-01 DIAGNOSIS — N651 Disproportion of reconstructed breast: Secondary | ICD-10-CM

## 2024-07-01 DIAGNOSIS — D0510 Intraductal carcinoma in situ of unspecified breast: Secondary | ICD-10-CM

## 2024-07-01 NOTE — Progress Notes (Signed)
 The patient is a 53 year old female here for follow-up after undergoing fat grafting to the left breast 6 weeks ago.  Overall the patient is doing really well.  She has great shape and contour of the left breast.  We took pictures today and put them in the chart with the patient permission.  She is quite pleased with the results.  She thinks that the left breast might be just a slight bit fuller than the right.  This is will likely even out as the fat settles in.  She would like to follow-up as needed.  Continue with the spanks and sports bra for 1 more month.

## 2024-07-14 ENCOUNTER — Ambulatory Visit: Payer: BC Managed Care – PPO | Admitting: Hematology and Oncology

## 2024-07-14 ENCOUNTER — Other Ambulatory Visit: Payer: BC Managed Care – PPO

## 2024-07-16 ENCOUNTER — Encounter: Payer: Self-pay | Admitting: Adult Health

## 2024-07-16 ENCOUNTER — Telehealth: Admitting: Adult Health

## 2024-07-16 DIAGNOSIS — F331 Major depressive disorder, recurrent, moderate: Secondary | ICD-10-CM

## 2024-07-16 DIAGNOSIS — F411 Generalized anxiety disorder: Secondary | ICD-10-CM

## 2024-07-16 DIAGNOSIS — G4700 Insomnia, unspecified: Secondary | ICD-10-CM | POA: Diagnosis not present

## 2024-07-16 DIAGNOSIS — F902 Attention-deficit hyperactivity disorder, combined type: Secondary | ICD-10-CM

## 2024-07-16 DIAGNOSIS — F909 Attention-deficit hyperactivity disorder, unspecified type: Secondary | ICD-10-CM | POA: Diagnosis not present

## 2024-07-16 NOTE — Progress Notes (Signed)
 Marie Castro [id number] 06-Jan-1971 53 y.o.  Virtual Visit via Video Note  I connected with pt @ on 07/16/24 at  5:00 PM EDT by a video enabled telemedicine application and verified that I am speaking with the correct person using two identifiers.   I discussed the limitations of evaluation and management by telemedicine and the availability of in person appointments. The patient expressed understanding and agreed to proceed.  I discussed the assessment and treatment plan with the patient. The patient was provided an opportunity to ask questions and all were answered. The patient agreed with the plan and demonstrated an understanding of the instructions.   The patient was advised to call back or seek an in-person evaluation if the symptoms worsen or if the condition fails to improve as anticipated.  I provided 25 minutes of non-face-to-face time during this encounter.  The patient was located at home.  The provider was located at [facility].   Angeline LOISE Sayers, NP   Subjective:   Patient ID:  Marie Castro is a 53 y.o. (DOB 12/05/1970) female.  Chief Complaint: No chief complaint on file.   HPI Marie Castro presents for follow-up of MDD, GAD insomnia and ADD.  Describes mood today as ok. Pleasant. Reports decreased tearfulness. Mood symptoms - reports some depression and anxiety - better able to manage things. Reports situational irritability. Reports improved interest and motivation. Denies panic attacks. Denies worry, rumination and over thinking. Denies obsessive thoughts or acts. Reports working through grief - I'm still working on things - loss of mother. Recovering from knee surgery - that's going great. Reports mood as stable - for the most part. Stating I feel hopeful. Feels like current medication regimen is helpful to manage mood symptoms.Taking medications as prescribed.  Energy levels improved. Active, does not have a regular exercise routine with current  physical disabilities. Enjoys some usual interests and activities. Married x 22 years. Has 2 children. Family local. Spending time with family. Appetite improved. Reports weight loss - 156 pounds Reports sleep has improved. Averages 6 hours.  Reports focus and concentration stable. Completing tasks. Managing aspects of household. Working for time [employer]. Denies SI or HI.  Denies AH or VH. Denies self harm. Denies substance use.  Previous medication trials: Effexor , Trazadone  Review of Systems:  Review of Systems  Musculoskeletal:  Negative for gait problem.  Neurological:  Negative for tremors.  Psychiatric/Behavioral:         Please refer to HPI    Medications: I have reviewed the patient's current medications.  Current Outpatient Medications  Medication Sig Dispense Refill   [employer] D3 25 MCG (1000 UT) capsule TAKE 1 CAPSULE BY MOUTH EVERY DAY 120 capsule 4   diphenhydrAMINE  (BENADRYL ) 25 mg capsule Take one capsule (25 mg dose) by mouth every 6 (six) hours as needed for Itching.     docusate sodium (COLACE) 100 MG capsule Take 100 mg by mouth daily.     eletriptan (RELPAX) 40 MG tablet Take 1 tablet (40 mg total) by mouth as needed for migraine or headache. May repeat in 2 hours if headache persists or recurs.     famotidine (PEPCID) 20 MG tablet Take 20 mg by mouth daily. AC     hydrOXYzine  (ATARAX ) 25 MG tablet Take one to two tablets at bedtime as needed for sleep. 60 tablet 2   ibuprofen (ADVIL,MOTRIN) 200 MG tablet Take 400-600 mg by mouth daily as needed for headache or moderate pain.  levocetirizine (XYZAL) 5 MG tablet Take 5 mg by mouth daily as needed for allergies.     levothyroxine (SYNTHROID) 112 MCG tablet Take 112 mcg by mouth every morning.     lisdexamfetamine (VYVANSE ) 40 MG capsule Take 1 capsule (40 mg total) by mouth every morning. 30 capsule 0   lisdexamfetamine (VYVANSE ) 40 MG capsule Take 1 capsule (40 mg total) by mouth every morning. 30 capsule  0   [START ON 07/29/2024] lisdexamfetamine (VYVANSE ) 40 MG capsule Take 1 capsule (40 mg total) by mouth every morning. 30 capsule 0   LORazepam  (ATIVAN ) 0.5 MG tablet Take 1 tablet (0.5 mg total) by mouth 2 (two) times daily as needed for anxiety. 60 tablet 2   ondansetron  (ZOFRAN ) 4 MG tablet Take 1 tablet (4 mg total) by mouth every 8 (eight) hours as needed for nausea or vomiting. (Patient not taking: Reported on 07/01/2024) 20 tablet 0   ondansetron  (ZOFRAN -ODT) 8 MG disintegrating tablet TAKE 1 TABLET (8 MG TOTAL) BY MOUTH EVERY 8 (EIGHT) HOURS AS NEEDED FOR NAUSEA OR VOMITING. 18 tablet 3   vortioxetine  HBr (TRINTELLIX ) 20 MG TABS tablet Take 1 tablet (20 mg total) by mouth daily. 30 tablet 2   zoledronic acid (RECLAST) 5 MG/100ML SOLN injection Infuse 100 mLs (5 mg total) into the vein once. Every other year for preventative     No current facility-administered medications for this visit.    Medication Side Effects: None  Allergies:  Allergies  Allergen Reactions   Latex Itching, Rash and Other (See Comments)    Pt states rubber bands from her braces causes ulcers in her mouth  mouth ulcers   Oxycodone  Rash and Other (See Comments)    Pt states could not sleep at all, was up 24 hrs     Celecoxib  Other (See Comments)    GI Upset (intolerance)    Past Medical History:  Diagnosis Date   Anxiety    Cancer (HCC)    breast   Depression    Family history of breast cancer    Family history of heart disease    Family history of prostate cancer    History of radiation therapy 10/04/17-11/22/17   left breast 50.4 Gy in 28 fractions, left breast boost 10 Gy in 5 fractions, right breast 50.4 Gy in 28 fractions, right breast boost 12 Gy in 6 fractions   Hypothyroidism    Idiopathic urticaria    Malignant neoplasm of upper-outer quadrant of left female breast (HCC)    Migraine    Overweight    Thyroid disease     Family History  Problem Relation Age of Onset   Cancer Mother         SKIN AND BREAST   Heart disease Mother    Heart disease Father    Hyperlipidemia Father    Depression Father    Cancer Maternal Grandmother        BREAST   Melanoma Maternal Grandmother        toe   Cancer Maternal Grandfather        PROSTATE   Irritable bowel syndrome Paternal Grandmother    Heart disease Paternal Grandfather    Breast cancer Maternal Aunt 72   Breast cancer Maternal Aunt        dx in her 7s    Social History   Socioeconomic History   Marital status: Married    Spouse name: Not on file   Number of children: 2   Years of  education: Not on file   Highest education level: Not on file  Occupational History   Not on file  Tobacco Use   Smoking status: Never   Smokeless tobacco: Never  Vaping Use   Vaping status: Never Used  Substance and Sexual Activity   Alcohol use: No   Drug use: No   Sexual activity: Yes  Other Topics Concern   Not on file  Social History Narrative   Not on file   Social Drivers of Health   Financial Resource Strain: Not on file  Food Insecurity: No Food Insecurity (09/28/2022)   [facility]   Hunger Vital Sign    Within the past 12 months, you worried that your food would run out before you got the money to buy more.: Never true    Within the past 12 months, the food you bought just didn't last and you didn't have money to get more.: Never true  Transportation Needs: Not on file  Physical Activity: Not on file  Stress: Not on file  Social Connections: Unknown (09/28/2022)   Received from [facility]   Social Network    Social Network: Not on file  Intimate Partner Violence: Unknown (09/28/2022)   Received from [facility]   HITS    Physically Hurt: Not on file    Insult or Talk Down To: Not on file    Threaten Physical Harm: Not on file    Scream or Curse: Not on file    Past Medical History, Surgical history, Social history, and Family history were reviewed and updated as appropriate.    Please see review of systems for further details on the patient's review from today.   Objective:   Physical Exam:  LMP 04/08/2018   Physical Exam Constitutional:      General: She is not in acute distress. Musculoskeletal:        General: No deformity.  Neurological:     Mental Status: She is alert and oriented to person, place, and time.     Coordination: Coordination normal.  Psychiatric:        Attention and Perception: Attention and perception normal. She does not perceive auditory or visual hallucinations.        Mood and Affect: Mood normal. Mood is not anxious or depressed. Affect is not labile, blunt, angry or inappropriate.        Speech: Speech normal.        Behavior: Behavior normal.        Thought Content: Thought content normal. Thought content is not paranoid or delusional. Thought content does not include homicidal or suicidal ideation. Thought content does not include homicidal or suicidal plan.        Cognition and Memory: Cognition and memory normal.        Judgment: Judgment normal.     Comments: Insight intact     Lab Review:     Component Value Date/Time   NA 137 07/13/2023 1542   K 4.5 07/13/2023 1542   CL 107 07/13/2023 1542   CO2 28 07/13/2023 1542   GLUCOSE 89 07/13/2023 1542   BUN 21 (H) 07/13/2023 1542   CREATININE 0.94 07/13/2023 1542   CALCIUM 8.6 (L) 07/13/2023 1542   PROT 5.1 (L) 07/13/2023 1542   ALBUMIN 3.3 (L) 07/13/2023 1542   AST 17 07/13/2023 1542   ALT 11 07/13/2023 1542   ALKPHOS 33 (L) 07/13/2023 1542   BILITOT 0.2 (L) 07/13/2023 1542   GFRNONAA >60 07/13/2023  1542   GFRAA >60 06/08/2020 1607   GFRAA >60 11/13/2017 1512       Component Value Date/Time   WBC 4.6 05/19/2024 0630   RBC 4.32 05/19/2024 0630   HGB 12.1 05/19/2024 0630   HGB 12.9 07/11/2022 1411   HCT 37.0 05/19/2024 0630   PLT 306 05/19/2024 0630   PLT 298 07/11/2022 1411   MCV 85.6 05/19/2024 0630   MCH 28.0 05/19/2024 0630   MCHC 32.7 05/19/2024  0630   RDW 12.7 05/19/2024 0630   LYMPHSABS 1.9 07/13/2023 1542   MONOABS 0.6 07/13/2023 1542   EOSABS 0.8 (H) 07/13/2023 1542   BASOSABS 0.1 07/13/2023 1542    No results found for: POCLITH, LITHIUM   No results found for: PHENYTOIN, PHENOBARB, VALPROATE, CBMZ   .res Assessment: Plan:    Plan:  PDMP reviewed  Hydroxyzine  25mg  - 1 to 2 at bedtime as needed for sleep  Trintellix  20mg  daily  Lorazepam  0.5mg  twice daily as needed  Vyvanse  40mg  daily  RTC 2 months  Patient advised to contact office with any questions, adverse effects, or acute worsening in signs and symptoms.   25 minutes spent dedicated to the care of this patient on the date of this encounter to include pre-visit review of records, ordering of medication, post visit documentation, and face-to-face time with the patient discussing depression and anxiety. Discussed continuing current medication regimen.  Discussed potential benefits, risk, and side effects of benzodiazepines to include potential risk of tolerance and dependence, as well as possible drowsiness.  Advised patient not to drive if experiencing drowsiness and to take lowest possible effective dose to minimize risk of dependence and tolerance.   Discussed potential benefits, risks, and side effects of stimulants with patient to include increased heart rate, palpitations, insomnia, increased anxiety, increased irritability, or decreased appetite.  Instructed patient to contact office if experiencing any significant tolerability issues.   There are no diagnoses linked to this encounter.   Please see After Visit Summary for patient specific instructions.  Future Appointments  Date Time Provider Department Center  07/16/2024  5:00 PM Kellianne Ek, Angeline Mattocks, NP [facility] None  07/18/2024 11:15 AM [facility] None  07/18/2024 11:45 AM Iruku, Amber, MD [facility] None  07/24/2024  3:00 PM Sherlynn Sober, LCSW [facility] None   08/07/2024  3:00 PM Sherlynn Sober, LCSW [facility] None    No orders of the defined types were placed in this encounter.     -------------------------------

## 2024-07-17 ENCOUNTER — Other Ambulatory Visit: Payer: Self-pay

## 2024-07-17 DIAGNOSIS — Z170 Estrogen receptor positive status [ER+]: Secondary | ICD-10-CM

## 2024-07-18 ENCOUNTER — Inpatient Hospital Stay: Payer: Self-pay | Attending: Hematology and Oncology

## 2024-07-18 ENCOUNTER — Inpatient Hospital Stay: Payer: Self-pay | Admitting: Hematology and Oncology

## 2024-07-18 VITALS — BP 116/77 | HR 78 | Temp 98.1°F | Resp 18 | Ht 65.5 in | Wt 158.7 lb

## 2024-07-18 DIAGNOSIS — Z170 Estrogen receptor positive status [ER+]: Secondary | ICD-10-CM | POA: Diagnosis not present

## 2024-07-18 DIAGNOSIS — C50211 Malignant neoplasm of upper-inner quadrant of right female breast: Secondary | ICD-10-CM

## 2024-07-18 DIAGNOSIS — Z79890 Hormone replacement therapy (postmenopausal): Secondary | ICD-10-CM | POA: Insufficient documentation

## 2024-07-18 DIAGNOSIS — Z803 Family history of malignant neoplasm of breast: Secondary | ICD-10-CM | POA: Insufficient documentation

## 2024-07-18 DIAGNOSIS — Z923 Personal history of irradiation: Secondary | ICD-10-CM | POA: Diagnosis not present

## 2024-07-18 DIAGNOSIS — E039 Hypothyroidism, unspecified: Secondary | ICD-10-CM | POA: Diagnosis not present

## 2024-07-18 DIAGNOSIS — Z8042 Family history of malignant neoplasm of prostate: Secondary | ICD-10-CM | POA: Insufficient documentation

## 2024-07-18 DIAGNOSIS — C50412 Malignant neoplasm of upper-outer quadrant of left female breast: Secondary | ICD-10-CM | POA: Diagnosis present

## 2024-07-18 DIAGNOSIS — Z79810 Long term (current) use of selective estrogen receptor modulators (SERMs): Secondary | ICD-10-CM | POA: Insufficient documentation

## 2024-07-18 DIAGNOSIS — Z79899 Other long term (current) drug therapy: Secondary | ICD-10-CM | POA: Insufficient documentation

## 2024-07-18 LAB — CMP (CANCER CENTER ONLY)
ALT: 11 U/L (ref 0–44)
AST: 16 U/L (ref 15–41)
Albumin: 4.5 g/dL (ref 3.5–5.0)
Alkaline Phosphatase: 75 U/L (ref 38–126)
Anion gap: 4 — ABNORMAL LOW (ref 5–15)
BUN: 15 mg/dL (ref 6–20)
CO2: 29 mmol/L (ref 22–32)
Calcium: 10.2 mg/dL (ref 8.9–10.3)
Chloride: 105 mmol/L (ref 98–111)
Creatinine: 0.72 mg/dL (ref 0.44–1.00)
GFR, Estimated: >60 mL/min (ref >60)
Glucose, Bld: 96 mg/dL (ref 70–99)
Potassium: 4.8 mmol/L (ref 3.5–5.1)
Sodium: 138 mmol/L (ref 135–145)
Total Bilirubin: 0.5 mg/dL (ref 0.0–1.2)
Total Protein: 7.4 g/dL (ref 6.5–8.1)

## 2024-07-18 LAB — CBC WITH DIFFERENTIAL (CANCER CENTER ONLY)
Abs Immature Granulocytes: 0.01 K/uL (ref 0.00–0.07)
Basophils Absolute: 0.1 K/uL (ref 0.0–0.1)
Basophils Relative: 2 %
Eosinophils Absolute: 0.5 K/uL (ref 0.0–0.5)
Eosinophils Relative: 11 %
HCT: 41.4 % (ref 36.0–46.0)
Hemoglobin: 14 g/dL (ref 12.0–15.0)
Immature Granulocytes: 0 %
Lymphocytes Relative: 25 %
Lymphs Abs: 1.1 K/uL (ref 0.7–4.0)
MCH: 28.3 pg (ref 26.0–34.0)
MCHC: 33.8 g/dL (ref 30.0–36.0)
MCV: 83.6 fL (ref 80.0–100.0)
Monocytes Absolute: 0.5 K/uL (ref 0.1–1.0)
Monocytes Relative: 11 %
Neutro Abs: 2.2 K/uL (ref 1.7–7.7)
Neutrophils Relative %: 51 %
Platelet Count: 342 K/uL (ref 150–400)
RBC: 4.95 MIL/uL (ref 3.87–5.11)
RDW: 12.5 % (ref 11.5–15.5)
WBC Count: 4.3 K/uL (ref 4.0–10.5)
nRBC: 0 % (ref 0.0–0.2)

## 2024-07-18 NOTE — Progress Notes (Signed)
 [name]  [phone] [phone]     ID: Marie Castro DOB: 18-Dec-1970  MR#: [id number]  RDW#:[id number]  Patient Care Team: Alvera Reagin, PA as PCP - General (Nurse Practitioner) Santo Stanly LABOR, MD as PCP - Cardiology (Cardiology) Aron Shoulders, MD as Consulting Physician (General Surgery) Shannon Agent, MD as Consulting Physician (Radiation Oncology) Perri Bjork, PA-C as Physician Assistant (Obstetrics and Gynecology) Register, Jeoffrey, PA-C as Physician Assistant (Dermatology) Cheryn Nickels, MD as Referring Physician (Allergy and Immunology) OTHER MD:  CHIEF COMPLAINT: Invasive lobular breast cancer, estrogen receptor positive  CURRENT TREATMENT: tamoxifen   INTERVAL HISTORY:  Lillien returns today for follow-up of her estrogen receptor positive breast cancer.   Discussed the use of AI scribe software for clinical note transcription with the patient, who gave verbal consent to proceed.  History of Present Illness Marie Castro is a 53 year old female with breast cancer who presents for follow-up after stopping tamoxifen  due to endometrial bleeding.  She has a history of breast cancer and was on tamoxifen  therapy since 2019. She stopped taking tamoxifen  in February 2025 due to endometrial bleeding. An ultrasound in June 2025 showed a decrease in endometrial lining thickness, although it remained above normal.  She underwent reconstruction on her left breast, which involved fat grafting for symmetry and correction of disfiguration from previous surgery and radiation. The reconstruction is progressing well, with the area becoming softer. She has not yet completed her annual mammogram due to the recent reconstruction but has it scheduled.  She had a partial knee replacement over the summer, which limited her physical activity. She plans to resume swimming and eventually return to weight-bearing exercises to improve muscle tone and bone  density.  Her family history includes breast cancer, and she was diagnosed at the age of 79. Her daughter, who is now 60, may need to start screening 10 years earlier than her age at diagnosis. Genetic testing was previously performed, but the details were not discussed.  No current soreness in the reconstructed breast area. Reports improvement in the softness of the reconstructed area.    REVIEW OF SYSTEMS: Detailed review of systems today was otherwise noncontributory   COVID 19 VACCINATION STATUS: Pfizer x2 and 1 booster.   HISTORY OF CURRENT ILLNESS: From the original intake note:  [name] had bilateral screening mammography at [facility] 07/06/2017, showing a new 1.5 cm oval mass in the right breast upper inner quadrant, a possible development asymmetry in the left breast upper outer quadrant, and a 0.3 cm area of calcifications in the left breast upper outer quadrant. On 07/11/2017 the patient underwent left diagnostic mammography with tomography and bilateral breast ultrasonography. The left breast mammography showed the breast density to be category C. In the upper-outer quadrant of the left breast there was a 0.8 cm oval mass which by ultrasound was a benign complicated cyst. There was also a 0.3 cm area of grouped calcifications in the left breast upper outer quadrant.  In the right breast, ultrasonography showed an irregular mass in the upper inner quadrant adjacent to a benign 1.5 cm simple cyst.  On 07/12/2017 she underwent bilateral biopsies. On the right there was an invasive lobular carcinoma, grade 1 or 2, estrogen receptor 80% positive with moderate staining intensity, progesterone receptor 95% positive with strong staining intensity, with an MIB-1 of 1%, and no HER-2 amplification, the signals ratio being 1.12 and the number per cell 1.85. ([id number])  On the left the biopsy showed atypical  lobular hyperplasia.  Both axillae were sonographically benign  The patient's  subsequent history is as detailed below.   PAST MEDICAL HISTORY: Past Medical History:  Diagnosis Date   Anxiety    Cancer (HCC)    breast   Depression    Family history of breast cancer    Family history of heart disease    Family history of prostate cancer    History of radiation therapy 10/04/17-11/22/17   left breast 50.4 Gy in 28 fractions, left breast boost 10 Gy in 5 fractions, right breast 50.4 Gy in 28 fractions, right breast boost 12 Gy in 6 fractions   Hypothyroidism    Idiopathic urticaria    Malignant neoplasm of upper-outer quadrant of left female breast (HCC)    Migraine    Overweight    Thyroid disease     PAST SURGICAL HISTORY: Past Surgical History:  Procedure Laterality Date   BREAST BIOPSY Right 07/12/2017   BREAST LUMPECTOMY WITH RADIOACTIVE SEED AND SENTINEL LYMPH NODE BIOPSY Right 08/16/2017   Procedure: RIGHT BREAST LUMPECTOMY WITH RADIOACTIVE SEED AND RIGHT SENTINEL LYMPH NODE BIOPSY;  Surgeon: Aron Shoulders, MD;  Location: [facility] OR;  Service: General;  Laterality: Right;   BREAST LUMPECTOMY WITH RADIOACTIVE SEED LOCALIZATION Left 08/16/2017   Procedure: LEFT BREAST PARTIAL MASTECTOMY WITH BRACKETED RADIOACTIVE SEED LOCALIZATION;  Surgeon: Aron Shoulders, MD;  Location: [facility] OR;  Service: General;  Laterality: Left;   DILATION AND CURETTAGE OF UTERUS     FAT INJECTION Left 05/19/2024   Procedure: INJECTION, FAT GRAFT;  Surgeon: Lowery Estefana RAMAN, DO;  Location: [facility] OR;  Service: Plastics;  Laterality: Left;  Left breast reconstruction with excision of scar contracture and fat grafting   KNEE SURGERY     LIPOSUCTION WITH LIPOFILLING Left 05/19/2024   Procedure: LIPOSUCTION, WITH FAT TRANSFER;  Surgeon: Lowery Estefana RAMAN, DO;  Location: [facility] OR;  Service: Plastics;  Laterality: Left;   MULTIPLE TOOTH EXTRACTIONS     RE-EXCISION OF BREAST LUMPECTOMY Left 08/27/2017   Procedure: LEFT RE-EXCISION OF BREAST LUMPECTOMY ERAS PATHWAY;  Surgeon: Aron Shoulders, MD;   Location: [facility];  Service: General;  Laterality: Left;   REVISION, RECONSTRUCTION, BREAST Left 05/19/2024   Procedure: REVISION, RECONSTRUCTION, BREAST;  Surgeon: Lowery Estefana RAMAN, DO;  Location: [facility] OR;  Service: Plastics;  Laterality: Left;   WISDOM TOOTH EXTRACTION      FAMILY HISTORY Family History  Problem Relation Age of Onset   Cancer Mother        SKIN AND BREAST   Heart disease Mother    Heart disease Father    Hyperlipidemia Father    Depression Father    Cancer Maternal Grandmother        BREAST   Melanoma Maternal Grandmother        toe   Cancer Maternal Grandfather        PROSTATE   Irritable bowel syndrome Paternal Grandmother    Heart disease Paternal Grandfather    Breast cancer Maternal Aunt 72   Breast cancer Maternal Aunt        dx in her 30s  The patient's father is 60 year old and her mother 82 year old as of October 2018. The patient has one sister, no brothers. The patient's mother and mother's mother both had breast cancer in their 70s. A maternal aunt had breast cancer at age 84, and another at age 68. The patient's mother has had genetics testing, which was negative. Please also consulted the detailed genetics pedigree in  Epic   GYNECOLOGIC HISTORY:  Patient's last menstrual period was 04/08/2018. Menarche age 5, first live birth age 6, she is still premenopausal, with regular periods lasting approximately 5 days, of which 3 are heavy. She used oral contraceptives approximately 2 years with no complications and also had a subcutaneous depot contraceptive briefly.  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 Her husband Industrial/product designer Personnel officer) is a Research officer, political party for [employer]. Their son, Trace, is 28 as of September 2022 and is going to be going to college studying business.  And daughter Recardo is  35 as of September 2022 the patient attends the [employer]    ADVANCED DIRECTIVES: In the absence of any documentation to the contrary, the patient's spouse is their HCPOA.   HEALTH MAINTENANCE: Social History   Tobacco Use   Smoking status: Never   Smokeless tobacco: Never  Vaping Use   Vaping status: Never Used  Substance Use Topics   Alcohol use: No   Drug use: No     Colonoscopy: Never  PAP:  Bone density: Never   Allergies  Allergen Reactions   Latex Itching, Rash and Other (See Comments)    Pt states rubber bands from her braces causes ulcers in her mouth  mouth ulcers   Oxycodone  Rash and Other (See Comments)    Pt states could not sleep at all, was up 24 hrs     Celecoxib  Other (See Comments)    GI Upset (intolerance)    Current Outpatient Medications  Medication Sig Dispense Refill   [employer] D3 25 MCG (1000 UT) capsule TAKE 1 CAPSULE BY MOUTH EVERY DAY 120 capsule 4   diphenhydrAMINE  (BENADRYL ) 25 mg capsule Take one capsule (25 mg dose) by mouth every 6 (six) hours as needed for Itching.     docusate sodium (COLACE) 100 MG capsule Take 100 mg by mouth daily.     eletriptan (RELPAX) 40 MG tablet Take 1 tablet (40 mg total) by mouth as needed for migraine or headache. May repeat in 2 hours if headache persists or recurs.     famotidine (PEPCID) 20 MG tablet Take 20 mg by mouth daily. AC (Patient taking differently: Take 40 mg by mouth daily. AC)     hydrOXYzine  (ATARAX ) 25 MG tablet Take one to two tablets at bedtime as needed for sleep. 60 tablet 2   ibuprofen (ADVIL,MOTRIN) 200 MG tablet Take 400-600 mg by mouth daily as needed for headache or moderate pain.     levocetirizine (XYZAL) 5 MG tablet Take 5 mg by mouth daily as needed for allergies.     levothyroxine (SYNTHROID) 112 MCG tablet Take 112 mcg by mouth every morning.     lisdexamfetamine (VYVANSE ) 40 MG capsule Take 1 capsule (40 mg total) by mouth every morning. 30 capsule 0   lisdexamfetamine  (VYVANSE ) 40 MG capsule Take 1 capsule (40 mg total) by mouth every morning. 30 capsule 0   [START ON 07/29/2024] lisdexamfetamine (VYVANSE ) 40 MG capsule Take 1 capsule (40 mg total) by mouth every morning. 30 capsule 0   LORazepam  (ATIVAN ) 0.5 MG tablet Take 1 tablet (0.5 mg total) by mouth 2 (two) times daily as needed for anxiety. 60 tablet 2   NURTEC 75 MG TBDP 75 mg.     ondansetron  (ZOFRAN -ODT) 8 MG disintegrating tablet TAKE 1 TABLET (8 MG TOTAL) BY MOUTH EVERY 8 (EIGHT) HOURS  AS NEEDED FOR NAUSEA OR VOMITING. 18 tablet 3   vortioxetine  HBr (TRINTELLIX ) 20 MG TABS tablet Take 1 tablet (20 mg total) by mouth daily. 30 tablet 2   zoledronic acid (RECLAST) 5 MG/100ML SOLN injection Infuse 100 mLs (5 mg total) into the vein once. Every other year for preventative     No current facility-administered medications for this visit.    OBJECTIVE: White woman in no acute distress  Vitals:   07/18/24 1142  BP: 116/77  Pulse: 78  Resp: 18  Temp: 98.1 F (36.7 C)  SpO2: 99%      Body mass index is 26.01 kg/m.   Wt Readings from Last 3 Encounters:  07/18/24 158 lb 11.2 oz (72 kg)  05/27/24 160 lb (72.6 kg)  05/19/24 159 lb (72.1 kg)      ECOG FS:1 - Symptomatic but completely ambulatory  General appearance: Alert, oriented and in no acute distress Bilateral breast exam: No palpable masses No regional adenopathy Left breast with a small post op seroma.   LAB RESULTS:  CMP     Component Value Date/Time   NA 137 07/13/2023 1542   K 4.5 07/13/2023 1542   CL 107 07/13/2023 1542   CO2 28 07/13/2023 1542   GLUCOSE 89 07/13/2023 1542   BUN 21 (H) 07/13/2023 1542   CREATININE 0.94 07/13/2023 1542   CALCIUM 8.6 (L) 07/13/2023 1542   PROT 5.1 (L) 07/13/2023 1542   ALBUMIN 3.3 (L) 07/13/2023 1542   AST 17 07/13/2023 1542   ALT 11 07/13/2023 1542   ALKPHOS 33 (L) 07/13/2023 1542   BILITOT 0.2 (L) 07/13/2023 1542   GFRNONAA >60 07/13/2023 1542   GFRAA >60 06/08/2020 1607   GFRAA  >60 11/13/2017 1512    No results found for: TOTALPROTELP, ALBUMINELP, A1GS, A2GS, BETS, BETA2SER, GAMS, MSPIKE, SPEI  No results found for: JONATHAN BONG, [facility]  Lab Results  Component Value Date   WBC 4.3 07/18/2024   NEUTROABS 2.2 07/18/2024   HGB 14.0 07/18/2024   HCT 41.4 07/18/2024   MCV 83.6 07/18/2024   PLT 342 07/18/2024      Chemistry      Component Value Date/Time   NA 137 07/13/2023 1542   K 4.5 07/13/2023 1542   CL 107 07/13/2023 1542   CO2 28 07/13/2023 1542   BUN 21 (H) 07/13/2023 1542   CREATININE 0.94 07/13/2023 1542      Component Value Date/Time   CALCIUM 8.6 (L) 07/13/2023 1542   ALKPHOS 33 (L) 07/13/2023 1542   AST 17 07/13/2023 1542   ALT 11 07/13/2023 1542   BILITOT 0.2 (L) 07/13/2023 1542       No results found for: LABCA2  No components found for: [id number]  No results for input(s): INR in the last 168 hours.  No results found for: LABCA2  No results found for: RJW800  No results found for: CAN125  No results found for: CAN153  No results found for: CA2729  No components found for: HGQUANT  No results found for: CEA1, CEA / No results found for: CEA1, CEA   No results found for: AFPTUMOR  No results found for: CHROMOGRNA  No results found for: HGBA, HGBA2QUANT, HGBFQUANT, HGBSQUAN (Hemoglobinopathy evaluation)   No results found for: LDH  No results found for: IRON, TIBC, IRONPCTSAT (Iron and TIBC)  No results found for: FERRITIN  Urinalysis No results found for: COLORURINE, APPEARANCEUR, LABSPEC, PHURINE, GLUCOSEU, HGBUR, BILIRUBINUR, KETONESUR, PROTEINUR, UROBILINOGEN, NITRITE, LEUKOCYTESUR   STUDIES: No  results found.   ELIGIBLE FOR AVAILABLE RESEARCH PROTOCOL: no  ASSESSMENT: 53 y.o.  woman status post right breast upper inner quadrant biopsy 07/11/2017 for a clinical T1c N0, stage IA  invasive lobular carcinoma, E-cadherin negative, grade 1 or 2, estrogen and progesterone receptor positive, HER-2 nonamplified, with an MIB-1 of 1%  (1) biopsy of left breast upper outer quadrant calcifications 07/11/2017 showed atypical lobular hyperplasia  (a) breast MRI 07/19/2017 shows 2 additional areas of concern in the left breast, with MRI guided biopsy 07/25/2017 showing ductal carcinoma in situ x2, low-grade, estrogen and progesterone receptor strongly positive.  (2) genetics testing 07/27/2017 through the STAT Breast cancer panel offered by [facility] found no deleterious mutations in ATM, BRCA1, BRCA2, CDH1, CHEK2, PALB2, PTEN, STK11 and TP53.   Reflexed to the Multi-Gene Panel offered by [facility] finding no deleterious mutations in ALK, APC, ATM, AXIN2,BAP1,  BARD1, BLM, BMPR1A, BRCA1, BRCA2, BRIP1, CASR, CDC73, CDH1, CDK4, CDKN1B, CDKN1C, CDKN2A (p14ARF), CDKN2A (p16INK4a), CEBPA, CHEK2, CTNNA1, DICER1, DIS3L2, EGFR (c.2369C>T, p.Thr790Met variant only), EPCAM (Deletion/duplication testing only), FH, FLCN, GATA2, GPC3, GREM1 (Promoter region deletion/duplication testing only), HOXB13 (c.251G>A, p.Gly84Glu), HRAS, KIT, MAX, MEN1, MET, MITF (c.952G>A, p.Glu318Lys variant only), MLH1, MSH2, MSH3, MSH6, MUTYH, NBN, NF1, NF2, NTHL1, PALB2, PDGFRA, PHOX2B, PMS2, POLD1, POLE, POT1, PRKAR1A, PTCH1, PTEN, RAD50, RAD51C, RAD51D, RB1, RECQL4, RET, RUNX1, SDHAF2, SDHA (sequence changes only), SDHB, SDHC, SDHD, SMAD4, SMARCA4, SMARCB1, SMARCE1, STK11, SUFU, TERT, TERT, TMEM127, TP53, TSC1, TSC2, VHL, WRN and WT1.   (3) tamoxifen  started neoadjuvantly 07/24/2017  (a) held July 2019 secondary to hives, resumed November 2019  [(b) Bx of hives = urticaria, treated with omalizumab]  (4) status post right lumpectomy and sentinel lymph node sampling 08/16/2017 for a pT1c pN0, stage IA invasive lobular carcinoma, grade 2, total of 3 sentinel nodes removed  (5) status post left lumpectomy 08/16/2017 for ductal  carcinoma in situ, low-grade, with focally positive margins, Tis NX.  (a) additional surgery for margin clearance 08/27/2017 was successful ([id number])  (6) Oncotype DX score of 12 predicts a 10-year risk of recurrence outside the breast of 8% if the patient's only systemic therapy is tamoxifen  for 5 years.  It also predicts no benefit from chemotherapy.  (7) adjuvant radiation 10/04/2018 to 11/22/2017: 50.4 Gy to the left breast with a 10 Gy boost as well as  50.4 Gy to the right breast with a 12 Gy boost  (8) to continue tamoxifen  a minimum of 5 years, then consider 2 years additional aromatase inhibitor treatment  (a) on 06/08/2020 FSH was 44.4 with estradiol  60.3 (perimenopausal).   PLAN: Assessment and Plan Assessment & Plan Breast cancer, status post left breast reconstruction with fat grafting and capsulotomy Reconstruction progressing well with softening of grafted area. - Discussed MRD testing for minimal residual disease detection, - Provided pamphlets on MRD testing ([employer] and [employer]). - Encourage reading about MRD testing, contact via MyChart if interested. - Continue scheduled bilateral mammogram at [facility]. - Encourage weight-bearing exercises for bone density.  Endometrial thickening, improving after tamoxifen  cessation Endometrial thickening improving post-tamoxifen  cessation, lining thickness decreased but above normal. Continue to follow up with gynecology  She wants to return to breast clinic as needed.  Total time spent: 20 minutes  *Total Encounter Time as defined by the Centers for Medicare and Medicaid Services includes, in addition to the face-to-face time of a patient visit (documented in the note above) non-face-to-face time: obtaining and reviewing outside history, ordering and reviewing medications, tests or procedures, care coordination (  communications with other health care professionals or caregivers) and documentation in the medical record.

## 2024-07-24 ENCOUNTER — Ambulatory Visit: Admitting: Psychiatry

## 2024-07-24 DIAGNOSIS — F331 Major depressive disorder, recurrent, moderate: Secondary | ICD-10-CM

## 2024-07-24 NOTE — Progress Notes (Signed)
 [facility] Counselor/Therapist Progress Note  Patient ID: Marie Castro, MRN: [id number],    Date: 07/24/2024  Time Spent: 55 minutes   Treatment Type: Individual Therapy  Reported Symptoms:  depression, anxiety, loss of both parents within past 1-3 yrs, loss of husband's job, wants to strengthen the marital relationship, life change ahead; is living with spouse and daughter who is senior in [employer] and son who is Holiday representative at  [employer]   Mental Status Exam:  Appearance:   Casual and Neat      Behavior:  Appropriate and Sharing  Motor:  Normal  Speech/Language:   Clear and Coherent  Affect:  Depressed and anxious  Mood:  anxious, depressed, and irritable  Thought process:  goal directed  Thought content:    WNL  Sensory/Perceptual disturbances:    WNL  Orientation:  oriented to person, place, time/date, situation, day of week, month of year, year, and stated date of Oct. 16, 2025  Attention:  Good  Concentration:  Good  Memory:  WNL  Fund of knowledge:   Good  Insight:    Good  Judgment:   Good  Impulse Control:  Good   Risk Assessment: Danger to Self:  No Self-injurious Behavior: No Danger to Others: No Duty to Warn:no Physical Aggression / Violence:No  Access to Firearms a concern: No  Gang Involvement:No   Subjective:   Patient in session today reporting continued anxiety, marital stress, and some depression.  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  Parents died in 08/08/2021 and 08-Aug-2024 and patient realizes that she is still grieving those losses.  Patient is mother of 2 children, 1 in college and the other 1 in high school.  Talks further today about how the anxiety worsened after parents death and husband lost his job after serving 20+ years.  Patient is anxious, hurt, with some depression and shares some marital stressors.  The family has several pet dogs who are a true comfort the patient and the entire family.  [name] is very  important to patient and especially has been during this recent time of struggle.  She has had difficulty in the past and managing food and not using it for comfort.  Admits that she does tend to take things very personally.  Finds that it is hard for her to let go of things or situations even when they are out of her control.  Quick to get mad and yell at times and has been working on this.  Denies any thoughts to harm herself or others.  Realizes and is concerned that current struggles are affecting the whole family.  Patient does have 1 sister that lives locally and is close to patient.  Motivated but also feeling some overwhelmedness.  On a positive note, she has supportive relationships, friends, family, church, a sense of hopefulness, she is a self advocate and able to communicate effectively.  She sees her barriers as being some emotional issues and stress.  Trying to have more balanced thinking each day.  Trying to recover some from financial stressors. Today talking further about family challenges, upset re: access to parent's house and fear that things are returning to the way it was- not good. Difficult conversation and sharing about the hurtful relationship. Difficulty with her energy. **  Hurt by sister. (Long-term).   Interventions:  1. Identify life conflicts and/or situations including from the past and the present, that support current symptomology  of anxiety/depression. 2. Develop behavioral and cognitive strategies to reduce or eliminate excessive anxiety, depression symptoms.  Identify, challenge, and replace negative self-talk with more positive, realistic, and empowering self-talk. 3.  Patient will work on developing reality based, positive cognitive messages that can help her improve her mood and outlook while also working on Environmental consultant.  Diagnosis:   ICD-10-CM   1. Major depressive disorder, recurrent episode, moderate (HCC)  F33.1      Plan: Patient today  reporting anxiety, hurt, depression, some continued marital stressors.  Recognizes she is still grieving the loss of her parents.  Has several pet dogs who are a true comfort to her and others in the family. Very difficult relationship with sister and shared a lot of this in session today. (Not all details included in this note due to patient privacy needs.) Grieving loss of parents but also grieving the issues between the patient and her sister.  They are natured very differently and patient seems hurt by sister's never making her a priority and seems to not be bothered by the fact they rarely do things together especially when patient has invited sister and husband often.  To follow up more on this at next session as we ran out of time today.  Patient had a lot to share today and working through a lot of very sensitive feelings.  Does admit that she does tend to take things quite personal and is needing to work on this as well.  Also hard to let go of things and sometimes quick to get mad and yell per her report.  It bothers her that current struggles affect the whole family.  Is a person of faith and stated again today that her faith is very important to her and is a factor in her trying to heal more.  Goal review and progress/challenges noted with patient.  Next appointment within 2-3 weeks.   Barnie Bunde, LCSW

## 2024-08-04 ENCOUNTER — Telehealth: Admitting: Adult Health

## 2024-08-07 ENCOUNTER — Ambulatory Visit: Admitting: Psychiatry

## 2024-08-07 DIAGNOSIS — F331 Major depressive disorder, recurrent, moderate: Secondary | ICD-10-CM | POA: Diagnosis not present

## 2024-08-07 NOTE — Progress Notes (Signed)
 [facility] Counselor/Therapist Progress Note  Patient ID: Marie Castro, MRN: [id number],    Date: 08/07/2024  Time Spent: 53 minutes   Treatment Type: Individual Therapy   Reported Symptoms: Anxiety, depression, loss of both parents within past 1-3 years, loss of husband's job, wants to strengthen the marital relationship, life change ahead, is living with spouse and daughter who is in high school.  (Son is consulting civil engineer at [employer])   Mental Status Exam:  Appearance:   Casual and Neat     Behavior:  Appropriate, Sharing, and Motivated  Motor:  Normal  Speech/Language:   Clear and Coherent  Affect:  Depressed and anxiety  Mood:  anxious and depressed  Thought process:  goal directed  Thought content:    WNL and Rumination  Sensory/Perceptual disturbances:    WNL  Orientation:  oriented to person, place, time/date, situation, day of week, month of year, year, and stated date of Oct. 30, 2025  Attention:  Good  Concentration:  Good  Memory:  WNL  Fund of knowledge:   Good  Insight:    Good  Judgment:   Good  Impulse Control:  Good   Risk Assessment: Danger to Self:  No Self-injurious Behavior: No Danger to Others: No Duty to Warn:no Physical Aggression / Violence:No  Access to Firearms a concern: No  Gang Involvement:No   Subjective:   Patient today in session and continuing to work on symptoms of depression, anxiety, and marital stress.  Contributing factors include her parents died in 09/09/21 and 09-09-2024 and patient realizes that she is still grieving these losses, she is a mother of 2 children (1 in college and the other in high school), and husband lost his job after serving 20+ years, and her anxiety and some depression worsened after husband's job loss and the loss of her parents particularly.  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 Today needing to process and work through more family-related issues, especially with sister who  didn't seem to be considering patient's concerns and desire for relationship that is healthy. Significant differences in their communication style, and often leading to hurtful encounters. Patient today is frustrated, hurt, anxious with some depression, but more energy to try and work through issues. Continues to feel hurt by sister but occasionally will have a more positive interaction with sister and she is not sure yet what makes a difference but is paying closer attention to that.  Patient does acknowledge that she tends to take things more personally in relationships.  Difficulty in the past of managing food and not using it for comfort and trying to stay away from that at this point.  Does not have any thoughts to harm herself or others per her report.  Not as quick to mad and yell as she has reported previously so she considers that to be some progress.  Varying concerns and struggles within the whole family and processed some of this today.  She does see her barriers to moving forward as being her emotional issues and stress.  Remains motivated while also feeling some of her wellness.  She is feeling fortunate that she does have some supportive relationships, family, friends, church, maintains a sense of hopefulness most of the time, is a good self advocate and able to communicate effectively with others.   Interventions: Cognitive Behavioral Therapy, Solution-Oriented/Positive Psychology, and Ego-Supportive 1. Identify life conflicts and/or situations including from the past and the present,  that support current symptomology of anxiety/depression. 2. Develop behavioral and cognitive strategies to reduce or eliminate excessive anxiety, depression symptoms.  Identify, challenge, and replace negative self-talk with more positive, realistic, and empowering self-talk. 3.  Patient will work on developing reality based, positive cognitive messages that can help her improve her mood and outlook while also  working on environmental consultant.   Diagnosis:   ICD-10-CM   1. Major depressive disorder, recurrent episode, moderate (HCC)  F33.1      Plan: Patient continues to work on her anxiety, depression, hurt, and some continued marital stressors. Husband having some upcoming job interviews she is encouraging for him and family.  Patient starting to recognize some improved effort on her part in terms of working on her therapeutic goals and was not as tearful today.  She does grieve the loss of her parents and that is a working progress.  Relationship with her sister continues to be rather difficult as noted in session today, however, occasionally the sister does not seem to respond in quite as negative ways towards patient.  Patient is trying to get a better read on this and better understand if there are things within the conversation or within their relationship that is tipping her sister in a more positive direction at times or more negative direction at other times.  Patient continues to not only grieve the loss of parents as stated above but also grieves the friction she experiences with her sister.  Discussed today some ways that she might interact with sister that might be more helpful in their relationship as well as things that may be more hurtful.  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  Goal review and progress/challenges noted with patient.  Next appointment within 2 to 3 weeks.   Barnie Bunde, LCSW

## 2024-08-12 ENCOUNTER — Ambulatory Visit (INDEPENDENT_AMBULATORY_CARE_PROVIDER_SITE_OTHER): Admitting: Psychiatry

## 2024-08-12 DIAGNOSIS — F331 Major depressive disorder, recurrent, moderate: Secondary | ICD-10-CM | POA: Diagnosis not present

## 2024-08-12 NOTE — Progress Notes (Signed)
 [facility] Counselor/Therapist Progress Note  Patient ID: Marie Castro, MRN: [id number],    Date: 08/12/2024  Time Spent: 55 minutes   Treatment Type: Individual Therapy  Reported Symptoms:    Depression, anxiety, loss of both parents within the past 1-3 years, wants to strengthen the marital relationship, loss of husband's job, life changes ahead, work on improving relationship with sister (2 yrs older than patient)   Mental Status Exam:  Appearance:   Casual and Neat     Behavior:  Appropriate, Sharing, and Motivated  Motor:  Normal  Speech/Language:   Normal Rate  Affect:  Depressed and anxious  Mood:  anxious and depressed  Thought process:  goal directed  Thought content:    WNL  Sensory/Perceptual disturbances:    WNL  Orientation:  oriented to person, place, time/date, situation, day of week, month of year, year, and stated date of Nov. 4, 2025  Attention:  Good  Concentration:  Good  Memory:  WNL  Fund of knowledge:   Good  Insight:    Good  Judgment:   Good  Impulse Control:  Good   Risk Assessment: Danger to Self:  No Self-injurious Behavior: No Danger to Others: No Duty to Warn:no Physical Aggression / Violence:No  Access to Firearms a concern: No  Gang Involvement:No   Subjective:   Patient working in session today further on her symptoms of depression, anxiety, and marital stress.  Continues living with husband and daughter and son is enrolled at [employer]. Relationship with younger sister (by 2 yrs) that is problematic and has been for a long time. Sharing more today of her desire to have better relationship but trying to help that happen is difficult, due to sister's responses that tend to push patient back. Differences in her and sister. I'm more of a team player than team leader.  Sister hurtful and controlling. Coming up, first Christmas without Mom and I'm dreading that. Talked further today about loss of mom and her missing her so much. Also  working to give so much power to sister who can be hurtful and controlling towards patient. I know I need to work on my people-pleasing. Second-guessing herself but starting to recognize some of her self-defeating behaviors especially in conversation with sister and/or daughter more quickly.  Talked further today in session about different ways of responding and communicating with daughter that might be helpful to her.  On a similar but also different note, discussed some of the communication that happens with her sister who is 2 years younger than patient.  Patient not wanting to feed her interactions with sister in an unhealthy way and set limits as needed.  Between her and her younger sister, there are very noticeable differences in their communication style and for patient, this can easily lead to feeling hurt or not understood.  Is working not to take things so personally in relationships and especially within the family.  History of managing food and wisely for comfort and not wanting to get into that again.  Knows that she is quick to get mad at times and yell which does not help situations.  Is starting to look more closely at her communication however and working to not always have to get a response, and not over personalizing certain things, and being able to let go.  She is very aware that she does tend to take things more personally and reports she is trying to interrupt this in order to make changes.  Is grateful for her supportive relationships within the family, friends, and church which helps her and feeling more hopeful more of the time, and also acknowledging that her communication is typically more effective with others (outside of close family particularly daughter and sister).  To continue working on this next session.   Interventions: Cognitive Behavioral Therapy, Solution-Oriented/Positive Psychology, and Ego-Supportive 1. Identify life conflicts and/or situations including from the  past and the present, that support current symptomology of anxiety/depression. 2. Develop behavioral and cognitive strategies to reduce or eliminate excessive anxiety, depression symptoms.  Identify, challenge, and replace negative self-talk with more positive, realistic, and empowering self-talk. 3.  Patient will work on developing reality based, positive cognitive messages that can help her improve her mood and outlook while also working on environmental consultant.   Diagnosis:   ICD-10-CM   1. Major depressive disorder, recurrent episode, moderate (HCC)  F33.1      Plan:    Patient working well in her session today, actively participating as she continues working on her anxiety, depression, communication, marital stressors, and better managing hurt.  Has been interviewing for jobs but no decisions yet.  No tearfulness today.  Still grieves the loss of her parents and feels that she is working through this.  Relationship with sister still problematic, and also with daughter at times.  Does regret and grieve the difficulty she has communicating with sister and some sisters reactions/responses to her.  Continues to work on some interaction skills as far as communicating and relating with sister that might be more helpful to her.  Shares how her faith really is helping her through some difficult times right now.  Goal review and progress/challenges noted with patient.  Next appointment within 2 to 3 weeks.   Barnie Bunde, LCSW

## 2024-08-25 ENCOUNTER — Encounter: Payer: Self-pay | Admitting: Plastic Surgery

## 2024-08-27 ENCOUNTER — Ambulatory Visit (INDEPENDENT_AMBULATORY_CARE_PROVIDER_SITE_OTHER): Admitting: Psychiatry

## 2024-08-27 DIAGNOSIS — F331 Major depressive disorder, recurrent, moderate: Secondary | ICD-10-CM | POA: Diagnosis not present

## 2024-08-27 NOTE — Progress Notes (Signed)
 [facility] Counselor/Therapist Progress Note  Patient ID: Marie Castro, MRN: [id number],    Date: 08/27/2024  Time Spent: 55 minutes   Treatment Type: Individual Therapy  Reported Symptoms: depression, anxiety, loss of both parents within past 1-3 yrs, wants to strengthen marital relationship, life changes ahead, loss of husband's job, life changes ahead,  work on improving relationship with sister (2 yrs older than patient)   Mental Status Exam:  Appearance:   Casual and Neat     Behavior:  Appropriate, Sharing, and Motivated  Motor:  Normal  Speech/Language:   Clear and Coherent  Affect:  Depressed and anxious  Mood:  anxious, depressed, and some frustration and some anger, short tempered at times  Thought process:  goal directed  Thought content:    WNL  Sensory/Perceptual disturbances:    WNL  Orientation:  oriented to person, place, time/date, situation, day of week, month of year, year, and stated date of Nov. 19, 2025  Attention:  Good  Concentration:  Good  Memory:  WNL  Fund of knowledge:   Good  Insight:    Good  Judgment:   Good  Impulse Control:  Good   Risk Assessment: Danger to Self:  No Self-injurious Behavior: No Danger to Others: No Duty to Warn:no Physical Aggression / Violence:No  Access to Firearms a concern: No  Gang Involvement:No   Subjective:   Patient today in session working further on her anxiety, depression, and marital stress. Living with husband and daughter while son is in [place] as a consulting civil engineer. Struggling continues with disappointment in relationship with sister and sister's not seeming interested in a closer and healthier relationship.  Sad regarding some of his sisters attitude and comments but does not want to stay stuck in a bad position, which she processed this more in session today and does have some friends that are supportive. (Not all details included in this note due to patient privacy needs).  Husband stressed and still  looking for employment in his job area of corporate wellness. Struggling with here own mixed feeling in all that is going on within the family and in her personal life with multiple changes. Asking family to help out more recently. Working further on her sadness, frustration, some grief re: dad and his seasonal affective issues. Difficult relationship with older sister and feeling hurt over sister's behaviors as discussed in session today and looking at strategies that can help empower patient in managing her feelings on this.  Issues of sadness, disappointment, and hurt and today she sorted through some of these trying to prioritize them as well as focus on thoughts and strategies that can help.  Did seem more positive towards end of session rather than feeling as defeated as she has some in the past.  Already thinking ahead about first Christmas without mom and some other factors that could make holiday time more difficult.  While we affirmed those factors, I also got patient to engage in looking at some challenges and situations during the upcoming holidays where she could experience some healthier, more hopeful, and more grounded times within the family and some friends.  Offering to patient a little more balanced view versus all bed are all good, especially with all that the family has been going through more recently.  Sister continues to be hurtful and that sometimes controlling, if allowed by others, and this point was also stressed with patient.  Working on decreasing some of her self-defeating behaviors and people-pleasing  behaviors.  Also working on some communication improvement within her immediate family and paying attention to how I say what I say as that can go along ways in supporting a healthier relationship when handled in a positive and mutually affirming way.  Patient continuing to work on not taking things so personally in relationships especially within the family.  Not wanting to slide  back into any old habits including using food for comfort or feeling like there is only 1 way to do certain things, and frequently taking things very personally that might not be meant that way by others.  Continues to be grateful for some supportive relationships within the family and her friends, along with her church.  Motivated to continue working on these issues and will see again within 2 approximately weeks.    Interventions: Cognitive Behavioral Therapy, Solution-Oriented/Positive Psychology, and Ego-Supportive 1. Identify life conflicts and/or situations including from the past and the present, that support current symptomology of anxiety/depression. 2. Develop behavioral and cognitive strategies to reduce or eliminate excessive anxiety, depression symptoms.  Identify, challenge, and replace negative self-talk with more positive, realistic, and empowering self-talk. 3.  Patient will work on developing reality based, positive cognitive messages that can help her improve her mood and outlook while also working on environmental consultant.    Diagnosis:   ICD-10-CM   1. Major depressive disorder, recurrent episode, moderate (HCC)  F33.1      Plan:  Patient today continues her active participation in session working further on her depression, communication especially within the family, marital stressors, anxiety, and more effectively managing her hurt.  Husband continues to interview for jobs but no offers yet.  Patient recognizing that she still grieves the loss of her parents, in the midst of all of her current stress and struggles.  Does however recognize a little more positives today, which seem to be more hopeful for her and she is recognizing this herself.  Understands that she cannot change other people, including people within her family, but can change how she relates to them and communicates with them.  Encouraged her to continue working on some of her communication and interaction  skills relating to sister and others.  Has a strong feeling that her faith is also helping her out some through difficult and challenging times currently.  Good work and some improved insight in session today.  Goal review and progress/challenges noted with patient.  Next appointment within 2 to 3 weeks.   Barnie Bunde, LCSW

## 2024-09-09 ENCOUNTER — Ambulatory Visit: Admitting: Psychiatry

## 2024-09-09 DIAGNOSIS — F331 Major depressive disorder, recurrent, moderate: Secondary | ICD-10-CM

## 2024-09-09 NOTE — Progress Notes (Unsigned)
 [facility] Counselor/Therapist Progress Note  Patient ID: Marie Castro, MRN: [id number],    Date: 09/09/2024  Time Spent: 53 minutes   Treatment Type: Individual Therapy  Reported Symptoms: depression, anxiety, wanting to strengthen marital relationship, loss of husband's job, life changes ahead, trying to improve relationship with sister (2 yrs older than patient)   Mental Status Exam:  Appearance:   Casual and Neat     Behavior:  Appropriate, Sharing, and Motivated  Motor:  Normal  Speech/Language:   Clear and Coherent  Affect:  Depressed and anxious  Mood:  anxious and depressed  Thought process:  goal directed  Thought content:    WNL  Sensory/Perceptual disturbances:    WNL  Orientation:  oriented to person, place, time/date, situation, day of week, month of year, year, and stated date of Dec. 2 , 2025  Attention:  Good  Concentration:  Good  Memory:  WNL  Fund of knowledge:   Good  Insight:    Good  Judgment:   Good  Impulse Control:  Good   Risk Assessment: Danger to Self:  No Self-injurious Behavior: No Danger to Others: No Duty to Warn:no Physical Aggression / Violence:No  Access to Firearms a concern: No  Gang Involvement:No   Subjective: Patient in session and working further today on her anxiety, depression, and marital stress. Hard to have my own stress and my husband is having his own stress as well and trying to help each other as well. Trying to be encouraging and acknowledge each other and what they share with each other. States they are trying to have some intentional time together when they are trying to be more positive, and that is helping some. Trying to help their college and high school age kids be more independent and help out more with the stress of everything at home. Trying to get kids to help a bit more in ways that they can. Issues with both children especially interpersonally at times and in conversation and lack of tolerance. Gives  son space when he huffs and puffs and can be difficult to engage him in healthier conversation. Working through some issues with both children; daughter hoping to get into college this coming year. Learning to deal with the realities and demands of life.My mind keeps going and when there is so much going on, it's difficult. Working on optician, dispensing and setting healthy limits; sometimes needing to say no. Trying not to overthink, and I like to coast into Christmas. Reports she is sleeping better but getting up can be challenging with so much going on. Processed positive self-care and her own needs. Continued struggles with older sister and with whom there is a strained relationship. Hurt by some of sister's reactions and what seems to be some lack of interest in their relationship. Trying to better manage hurt feelings over relationship with sister, or lack of relationship. Also working on upgrading some communication with family and beyond. Is motivated and wants to work harder on specific behaviors and in managing stress and challenges at home and work.   Interventions: Cognitive Behavioral Therapy, Solution-Oriented/Positive Psychology, and Ego-Supportive 1. Identify life conflicts and/or situations including from the past and the present, that support current symptomology of anxiety/depression. 2. Develop behavioral and cognitive strategies to reduce or eliminate excessive anxiety, depression symptoms.  Identify, challenge, and replace negative self-talk with more positive, realistic, and empowering self-talk. 3.  Patient will work on developing reality based, positive cognitive messages that can  help her improve her mood and outlook while also working on environmental consultant.   Diagnosis:   ICD-10-CM   1. Major depressive disorder, recurrent episode, moderate (HCC)  F33.1      Plan: Patient today working further on her family stressors, anxiety, marital relationship, and  depression.  Being more cognizant of her communication and how to better say and express her feelings to others within the family as she shares how they tend to respond in different ways, and she recognizes that they are all under stress due to certain circumstances within individuals and the family as a whole.  Working further on her depression, stress management, managing her, and looking for the positives as well.  Husband has continued in his search for jobs but no forward movement yet.  Still grieving the loss of her parents while in the midst of all of the family's current stress and struggles trying to manage some difficult circumstances currently.  (Not all details included in this note due to patient privacy needs).  Patient is able to recognize some of the positives within the family.  Continue to encourage her and communication and interaction skills with her sister and others within the family.  States that her faith is important to her especially in the midst of difficulties.  Insight seems to be improving further.  Goal review and progress/challenges noted with patient.  Next appointment within 2 to 3 weeks.   Barnie Bunde, LCSW

## 2024-09-11 ENCOUNTER — Encounter: Payer: Self-pay | Admitting: Adult Health

## 2024-09-11 ENCOUNTER — Telehealth: Payer: Self-pay

## 2024-09-11 ENCOUNTER — Telehealth: Admitting: Adult Health

## 2024-09-11 DIAGNOSIS — F909 Attention-deficit hyperactivity disorder, unspecified type: Secondary | ICD-10-CM | POA: Diagnosis not present

## 2024-09-11 DIAGNOSIS — F411 Generalized anxiety disorder: Secondary | ICD-10-CM

## 2024-09-11 DIAGNOSIS — G4700 Insomnia, unspecified: Secondary | ICD-10-CM

## 2024-09-11 DIAGNOSIS — F331 Major depressive disorder, recurrent, moderate: Secondary | ICD-10-CM | POA: Diagnosis not present

## 2024-09-11 DIAGNOSIS — F902 Attention-deficit hyperactivity disorder, combined type: Secondary | ICD-10-CM

## 2024-09-11 MED ORDER — LORAZEPAM 0.5 MG PO TABS: 0.5000 mg | ORAL_TABLET | Freq: Two times a day (BID) | ORAL | 2 refills | Status: AC | PRN

## 2024-09-11 MED ORDER — VORTIOXETINE HBR 20 MG PO TABS: 20.0000 mg | ORAL_TABLET | Freq: Every day | ORAL | 2 refills | Status: AC

## 2024-09-11 MED ORDER — LISDEXAMFETAMINE DIMESYLATE 60 MG PO CAPS: 60.0000 mg | ORAL_CAPSULE | ORAL | 0 refills | Status: DC

## 2024-09-11 MED ORDER — HYDROXYZINE HCL 25 MG PO TABS: ORAL_TABLET | ORAL | 2 refills | Status: AC

## 2024-09-11 NOTE — Telephone Encounter (Signed)
 PA approved Lisdexamfetamine 60 mg 09/11/24-09/12/27 [employer]

## 2024-09-11 NOTE — Progress Notes (Signed)
 Marie Castro [id number] 10-31-1970 53 y.o.  Virtual Visit via Video Note  I connected with pt @ on 09/11/24 at  3:30 PM EST by a video enabled telemedicine application and verified that I am speaking with the correct person using two identifiers.   I discussed the limitations of evaluation and management by telemedicine and the availability of in person appointments. The patient expressed understanding and agreed to proceed.  I discussed the assessment and treatment plan with the patient. The patient was provided an opportunity to ask questions and all were answered. The patient agreed with the plan and demonstrated an understanding of the instructions.   The patient was advised to call back or seek an in-person evaluation if the symptoms worsen or if the condition fails to improve as anticipated.  I provided 25 minutes of non-face-to-face time during this encounter.  The patient was located at home.  The provider was located at [facility].   Marie LOISE Sayers, NP   Subjective:   Patient ID:  Marie Castro is a 53 y.o. (DOB 1971/09/17) female.  Chief Complaint: No chief complaint on file.   HPI Marie Castro presents for follow-up of MDD, GAD insomnia and ADD.  Describes mood today as ok. Pleasant. Reports decreased tearfulness. Mood symptoms - reports some depression and anxiety - loss - holidays. Reports situational irritability. Reports improved interest and motivation. Denies panic attacks. Denies worry, rumination and over thinking. Denies obsessive thoughts or acts. Reports working through grief - loss of mother. Recovered from knee surgery. Reports mood as variable right now because of the holidays. Stating I feel like I'm doing better. Feels like current medication regimen is helpful to manage mood symptoms. Taking medications as prescribed.  Energy levels improved. Active, does not have a regular exercise routine with current physical disabilities. Enjoys some  usual interests and activities. Married x 22 years. Has 2 children. Family local. Spending time with family. Appetite improved. Reports weight stable - 156 pounds Reports sleep has improved. Averages 6 to 8 hours.  Reports focus and concentration has improved during the day, but struggles in the afternoons. Completing tasks. Managing aspects of household. Working for time [employer]. Denies SI or HI.  Denies AH or VH. Denies self harm. Denies substance use.  Previous medication trials: Effexor , Trazadone   Review of Systems:  Review of Systems  Musculoskeletal:  Negative for gait problem.  Neurological:  Negative for tremors.  Psychiatric/Behavioral:         Please refer to HPI    Medications: I have reviewed the patient's current medications.  Current Outpatient Medications  Medication Sig Dispense Refill   [employer] D3 25 MCG (1000 UT) capsule TAKE 1 CAPSULE BY MOUTH EVERY DAY 120 capsule 4   diphenhydrAMINE  (BENADRYL ) 25 mg capsule Take one capsule (25 mg dose) by mouth every 6 (six) hours as needed for Itching.     docusate sodium (COLACE) 100 MG capsule Take 100 mg by mouth daily.     eletriptan (RELPAX) 40 MG tablet Take 1 tablet (40 mg total) by mouth as needed for migraine or headache. May repeat in 2 hours if headache persists or recurs.     famotidine (PEPCID) 20 MG tablet Take 20 mg by mouth daily. AC (Patient taking differently: Take 40 mg by mouth daily. AC)     hydrOXYzine  (ATARAX ) 25 MG tablet Take one to two tablets at bedtime as needed for sleep. 60 tablet 2   ibuprofen (ADVIL,MOTRIN) 200 MG  tablet Take 400-600 mg by mouth daily as needed for headache or moderate pain.     levocetirizine (XYZAL) 5 MG tablet Take 5 mg by mouth daily as needed for allergies.     levothyroxine (SYNTHROID) 112 MCG tablet Take 112 mcg by mouth every morning.     lisdexamfetamine (VYVANSE ) 40 MG capsule Take 1 capsule (40 mg total) by mouth every morning. 30 capsule 0   lisdexamfetamine  (VYVANSE ) 40 MG capsule Take 1 capsule (40 mg total) by mouth every morning. 30 capsule 0   lisdexamfetamine (VYVANSE ) 40 MG capsule Take 1 capsule (40 mg total) by mouth every morning. 30 capsule 0   LORazepam  (ATIVAN ) 0.5 MG tablet Take 1 tablet (0.5 mg total) by mouth 2 (two) times daily as needed for anxiety. 60 tablet 2   NURTEC 75 MG TBDP 75 mg.     ondansetron  (ZOFRAN -ODT) 8 MG disintegrating tablet TAKE 1 TABLET (8 MG TOTAL) BY MOUTH EVERY 8 (EIGHT) HOURS AS NEEDED FOR NAUSEA OR VOMITING. 18 tablet 3   vortioxetine  HBr (TRINTELLIX ) 20 MG TABS tablet Take 1 tablet (20 mg total) by mouth daily. 30 tablet 2   zoledronic acid (RECLAST) 5 MG/100ML SOLN injection Infuse 100 mLs (5 mg total) into the vein once. Every other year for preventative     No current facility-administered medications for this visit.    Medication Side Effects: None  Allergies:  Allergies  Allergen Reactions   Latex Itching, Rash and Other (See Comments)    Pt states rubber bands from her braces causes ulcers in her mouth  mouth ulcers   Oxycodone  Rash and Other (See Comments)    Pt states could not sleep at all, was up 24 hrs     Celecoxib  Other (See Comments)    GI Upset (intolerance)    Past Medical History:  Diagnosis Date   Anxiety    Cancer (HCC)    breast   Depression    Family history of breast cancer    Family history of heart disease    Family history of prostate cancer    History of radiation therapy 10/04/17-11/22/17   left breast 50.4 Gy in 28 fractions, left breast boost 10 Gy in 5 fractions, right breast 50.4 Gy in 28 fractions, right breast boost 12 Gy in 6 fractions   Hypothyroidism    Idiopathic urticaria    Malignant neoplasm of upper-outer quadrant of left female breast (HCC)    Migraine    Overweight    Thyroid disease     Family History  Problem Relation Age of Onset   Cancer Mother        SKIN AND BREAST   Heart disease Mother    Heart disease Father     Hyperlipidemia Father    Depression Father    Cancer Maternal Grandmother        BREAST   Melanoma Maternal Grandmother        toe   Cancer Maternal Grandfather        PROSTATE   Irritable bowel syndrome Paternal Grandmother    Heart disease Paternal Grandfather    Breast cancer Maternal Aunt 72   Breast cancer Maternal Aunt        dx in her 74s    Social History   Socioeconomic History   Marital status: Married    Spouse name: Not on file   Number of children: 2   Years of education: Not on file   Highest education level:  Not on file  Occupational History   Not on file  Tobacco Use   Smoking status: Never   Smokeless tobacco: Never  Vaping Use   Vaping status: Never Used  Substance and Sexual Activity   Alcohol use: No   Drug use: No   Sexual activity: Yes  Other Topics Concern   Not on file  Social History Narrative   Not on file   Social Drivers of Health   Financial Resource Strain: Not on file  Food Insecurity: No Food Insecurity (09/28/2022)   [facility]   Hunger Vital Sign    Within the past 12 months, you worried that your food would run out before you got the money to buy more.: Never true    Within the past 12 months, the food you bought just didn't last and you didn't have money to get more.: Never true  Transportation Needs: Not on file  Physical Activity: Not on file  Stress: Not on file  Social Connections: Not on file  Intimate Partner Violence: Not on file    Past Medical History, Surgical history, Social history, and Family history were reviewed and updated as appropriate.   Please see review of systems for further details on the patient's review from today.   Objective:   Physical Exam:  LMP 04/08/2018   Physical Exam Constitutional:      General: She is not in acute distress. Musculoskeletal:        General: No deformity.  Neurological:     Mental Status: She is alert and oriented to person, place, and time.      Coordination: Coordination normal.  Psychiatric:        Attention and Perception: Attention and perception normal. She does not perceive auditory or visual hallucinations.        Mood and Affect: Mood normal. Mood is not anxious or depressed. Affect is not labile, blunt, angry or inappropriate.        Speech: Speech normal.        Behavior: Behavior normal.        Thought Content: Thought content normal. Thought content is not paranoid or delusional. Thought content does not include homicidal or suicidal ideation. Thought content does not include homicidal or suicidal plan.        Cognition and Memory: Cognition and memory normal.        Judgment: Judgment normal.     Comments: Insight intact     Lab Review:     Component Value Date/Time   NA 138 07/18/2024 1127   K 4.8 07/18/2024 1127   CL 105 07/18/2024 1127   CO2 29 07/18/2024 1127   GLUCOSE 96 07/18/2024 1127   BUN 15 07/18/2024 1127   CREATININE 0.72 07/18/2024 1127   CALCIUM 10.2 07/18/2024 1127   PROT 7.4 07/18/2024 1127   ALBUMIN 4.5 07/18/2024 1127   AST 16 07/18/2024 1127   ALT 11 07/18/2024 1127   ALKPHOS 75 07/18/2024 1127   BILITOT 0.5 07/18/2024 1127   GFRNONAA >60 07/18/2024 1127   GFRAA >60 06/08/2020 1607   GFRAA >60 11/13/2017 1512       Component Value Date/Time   WBC 4.3 07/18/2024 1127   WBC 4.6 05/19/2024 0630   RBC 4.95 07/18/2024 1127   HGB 14.0 07/18/2024 1127   HCT 41.4 07/18/2024 1127   PLT 342 07/18/2024 1127   MCV 83.6 07/18/2024 1127   MCH 28.3 07/18/2024 1127   MCHC 33.8 07/18/2024 1127  RDW 12.5 07/18/2024 1127   LYMPHSABS 1.1 07/18/2024 1127   MONOABS 0.5 07/18/2024 1127   EOSABS 0.5 07/18/2024 1127   BASOSABS 0.1 07/18/2024 1127    No results found for: POCLITH, LITHIUM   No results found for: PHENYTOIN, PHENOBARB, VALPROATE, CBMZ   .res Assessment: Plan:    Plan:  PDMP reviewed  Hydroxyzine  25mg  - 1 to 2 at bedtime as needed for sleep Lorazepam  0.5mg   twice daily as needed  Trintellix  20mg  daily  Vyvanse  40mg  to 50mg  daily  RTC 4 weeks  Patient advised to contact office with any questions, adverse effects, or acute worsening in signs and symptoms.   25 minutes spent dedicated to the care of this patient on the date of this encounter to include pre-visit review of records, ordering of medication, post visit documentation, and face-to-face time with the patient discussing depression and anxiety. Discussed continuing current medication regimen.  Discussed potential benefits, risk, and side effects of benzodiazepines to include potential risk of tolerance and dependence, as well as possible drowsiness. Advised patient not to drive if experiencing drowsiness and to take lowest possible effective dose to minimize risk of dependence and tolerance.   Discussed potential benefits, risks, and side effects of stimulants with patient to include increased heart rate, palpitations, insomnia, increased anxiety, increased irritability, or decreased appetite.  Instructed patient to contact office if experiencing any significant tolerability issues.    There are no diagnoses linked to this encounter.   Please see After Visit Summary for patient specific instructions.  Future Appointments  Date Time Provider Department Center  09/11/2024  3:30 PM Sallyann Kinnaird, Marie Mattocks, NP [facility] None  09/25/2024  3:00 PM Sherlynn Sober, LCSW [facility] None  10/07/2024 10:00 AM Sherlynn Sober, LCSW [facility] None  10/21/2024  3:00 PM Sherlynn Sober, LCSW [facility] None  11/06/2024  3:00 PM Sherlynn Sober, LCSW [facility] None  11/18/2024  3:00 PM Sherlynn Sober, LCSW [facility] None  07/21/2025 11:45 AM Loretha Ash, MD [facility] None    No orders of the defined types were placed in this encounter.     -------------------------------

## 2024-09-22 ENCOUNTER — Other Ambulatory Visit: Payer: Self-pay | Admitting: Nurse Practitioner

## 2024-09-22 DIAGNOSIS — R9389 Abnormal findings on diagnostic imaging of other specified body structures: Secondary | ICD-10-CM

## 2024-09-25 ENCOUNTER — Ambulatory Visit: Admitting: Psychiatry

## 2024-09-25 DIAGNOSIS — F331 Major depressive disorder, recurrent, moderate: Secondary | ICD-10-CM | POA: Diagnosis not present

## 2024-09-25 NOTE — Progress Notes (Signed)
 [facility] Counselor/Therapist Progress Note  Patient ID: Marie Castro, MRN: [id number],    Date: 09/25/2024  Time Spent: 55 minutes   Treatment Type: Individual Therapy  Reported Symptoms: depression, anxiety, working on strengthening her marital relationship, loss of husband's job and has been very impacted by this, life changes ahead, trying to improve relationship with sister (2 yrs older than patient)   Mental Status Exam:  Appearance:   Casual     Behavior:  Appropriate, Sharing, and Motivated  Motor:  Normal  Speech/Language:   Clear and Coherent  Affect:  Depressed and anxious  Mood:  anxious and depressed  Thought process:  goal directed  Thought content:    WNL  Sensory/Perceptual disturbances:    WNL  Orientation:  oriented to person, place, time/date, situation, day of week, month of year, year, and stated date of Dec. 18d, 2025  Attention:  Good  Concentration:  Good  Memory:  WNL  Fund of knowledge:   Good  Insight:    Good  Judgment:   Good  Impulse Control:  Good   Risk Assessment: Danger to Self:  No Self-injurious Behavior: No Danger to Others: No Duty to Warn:no Physical Aggression / Violence:No  Access to Firearms a concern: No  Gang Involvement:No   Subjective:   Patient today in session and continuing to work on her anxiety, marital stress, depression, and overall family stressors. Working further today on each of these symptoms and particularly her anxiety and stress over upcoming time to spend with her sister which can be very stressful. Talking through some of her struggles with sister and in the loss of their parents, who they much loved. Husband can deal with the kids without emotion. Communication issues worked on further today, including communication about their family relationships, give and take, and being able to move forward. Worried about how Christmas will  shape there family time together due to some family stressors going on  currently. Discussed ways for patient to feel more ready for whatever may occur or be said at family Christmas time coming up. Working to let go of a lot of stress that has built up within herself.  Good motor patient today as she worked hard and was able to talk about multiple issues that have been building up.  Working to strengthen relationships within the family.  Giving son and daughter space when they needed.  Continues to work on dealing with the realities of life and so much going on within their lives and family.  (Not all details included in this note due to patient privacy needs).  Is sleeping better.  Continues to be hurt by some of sisters actions and reactions.  Communication better within the family.  Patient feeling more motivated and shows more intentional work on her treatment goals and in better managing stress, including stress within the family.    Interventions: Cognitive Behavioral Therapy, Solution-Oriented/Positive Psychology, and Ego-Supportive 1. Identify life conflicts and/or situations including from the past and the present, that support current symptomology of anxiety/depression. 2. Develop behavioral and cognitive strategies to reduce or eliminate excessive anxiety, depression symptoms.  Identify, challenge, and replace negative self-talk with more positive, realistic, and empowering self-talk. 3.  Patient will work on developing reality based, positive cognitive messages that can help her improve her mood and outlook while also working on environmental consultant.    Diagnosis:   ICD-10-CM   1. Major depressive disorder, recurrent episode, moderate (HCC)  F33.1  Plan:  Patient working well in session today as she confronts issues regarding family stressors, anxiety, depression, and marital relationship stressors.  Since last session she has intentionally been more focused on her communication and how to better express herself with her words and feelings to  others within the family as she has noticed how they respond differently depending on how she interacts with them, which is a positive learning point for her.  She is also recognizing how they are all under stress due to varying circumstances and that is impacting their relationships although patient is trying to help other family members pull together during the stressful time in their lives.  Continues to still grieve the loss of her parents and trying to move forward.  Her faith is important to her and she feels it is helping her both within the family and within herself.  Goal review and progress/challenges noted with patient.  Next appointment within 2 to 3 weeks.   Barnie Bunde, LCSW

## 2024-10-03 ENCOUNTER — Other Ambulatory Visit

## 2024-10-06 ENCOUNTER — Encounter: Payer: Self-pay | Admitting: Hematology and Oncology

## 2024-10-07 ENCOUNTER — Ambulatory Visit: Admitting: Psychiatry

## 2024-10-07 NOTE — Progress Notes (Unsigned)
"   °      [facility] Counselor/Therapist Progress Note  Patient ID: Marie Castro, MRN: [id number],    Date: 10/07/2024  Time Spent: ***   Treatment Type: {[facility] AMB THERAPY TYPES:[id number]}  Reported Symptoms: ***  Mental Status Exam:  Appearance:   {PSY:22683}     Behavior:  {PSY:[id number]}  Motor:  {PSY:22302}  Speech/Language:   {PSY:22685}  Affect:  {PSY:22687}  Mood:  {PSY:31886}  Thought process:  {PSY:31888}  Thought content:    {[phone]}  Sensory/Perceptual disturbances:    {PSY:[phone]}  Orientation:  {PSY:30297}  Attention:  {PSY:22877}  Concentration:  {[phone]}  Memory:  {[phone]}  Fund of knowledge:   {[phone]}  Insight:    {[phone]}  Judgment:   {[phone]}  Impulse Control:  {[phone]}   Risk Assessment: Danger to Self:  {PSY:22692} Self-injurious Behavior: {PSY:22692} Danger to Others: {PSY:22692} Duty to Warn:{PSY:311194} Physical Aggression / Violence:{PSY:21197} Access to Firearms a concern: {PSY:21197} Gang Involvement:{PSY:21197}  Subjective: ***    Interventions: {[phone]}  Diagnosis:No diagnosis found.   Plan: ***   Barnie Bunde, LCSW                   "

## 2024-10-10 ENCOUNTER — Encounter: Payer: Self-pay | Admitting: Adult Health

## 2024-10-10 ENCOUNTER — Telehealth: Admitting: Adult Health

## 2024-10-10 DIAGNOSIS — G4700 Insomnia, unspecified: Secondary | ICD-10-CM | POA: Diagnosis not present

## 2024-10-10 DIAGNOSIS — F902 Attention-deficit hyperactivity disorder, combined type: Secondary | ICD-10-CM

## 2024-10-10 DIAGNOSIS — F331 Major depressive disorder, recurrent, moderate: Secondary | ICD-10-CM

## 2024-10-10 DIAGNOSIS — F411 Generalized anxiety disorder: Secondary | ICD-10-CM

## 2024-10-10 MED ORDER — LISDEXAMFETAMINE DIMESYLATE 60 MG PO CAPS: 60.0000 mg | ORAL_CAPSULE | ORAL | 0 refills | Status: DC

## 2024-10-10 NOTE — Progress Notes (Signed)
 Marie Castro [id number] 1971/08/05 54 y.o.  Virtual Visit via Video Note  I connected with pt @ on 10/10/2024 at  3:30 PM EST by a video enabled telemedicine application and verified that I am speaking with the correct person using two identifiers.   I discussed the limitations of evaluation and management by telemedicine and the availability of in person appointments. The patient expressed understanding and agreed to proceed.  I discussed the assessment and treatment plan with the patient. The patient was provided an opportunity to ask questions and all were answered. The patient agreed with the plan and demonstrated an understanding of the instructions.   The patient was advised to call back or seek an in-person evaluation if the symptoms worsen or if the condition fails to improve as anticipated.  I provided 25 minutes of non-face-to-face time during this encounter.  The patient was located at home.  The provider was located at [facility].   Marie Marie Sayers, NP   Subjective:   Patient ID:  Marie Castro is a 54 y.o. (DOB [birth year]) female.  Chief Complaint: No chief complaint on file.   HPI Marie Castro presents for follow-up of MDD, GAD insomnia and ADD.  Describes mood today as ok. Pleasant. Reports decreased tearfulness. Mood symptoms - reports some depression, anxiety and irritability - situational stressors - first Christmas without mother - husband out of work. Reports varying interest and motivation. Denies panic attacks. Denies worry, rumination and over thinking. Denies obsessive thoughts or acts.  Reports mood as variable. Stating I feel like everything is up and down. Feels like current medication regimen is helpful to manage mood symptoms. Taking medications as prescribed.  Energy levels improved. Active, has a regular exercise routine with current physical disabilities. Enjoys some usual interests and activities. Married x 22 years. Has 2 children. Family  local. Spending time with family. Appetite improved. Reports weight stable - 156 pounds. Reports sleep has improved. Averages 6 to 8 hours.  Reports focus and concentration has improved. Completing tasks. Managing aspects of household. Working full time [employer]. Denies SI or HI.  Denies AH or VH. Denies self harm. Denies substance use.  Previous medication trials: Effexor , Trazadone  Review of Systems:  Review of Systems  Musculoskeletal:  Negative for gait problem.  Neurological:  Negative for tremors.  Psychiatric/Behavioral:         Please refer to HPI   Medications: I have reviewed the patient's current medications.  Current Outpatient Medications  Medication Sig Dispense Refill   [employer] D3 25 MCG (1000 UT) capsule TAKE 1 CAPSULE BY MOUTH EVERY DAY 120 capsule 4   diphenhydrAMINE  (BENADRYL ) 25 mg capsule Take one capsule (25 mg dose) by mouth every 6 (six) hours as needed for Itching.     docusate sodium (COLACE) 100 MG capsule Take 100 mg by mouth daily.     eletriptan (RELPAX) 40 MG tablet Take 1 tablet (40 mg total) by mouth as needed for migraine or headache. May repeat in 2 hours if headache persists or recurs.     famotidine (PEPCID) 20 MG tablet Take 20 mg by mouth daily. AC (Patient taking differently: Take 40 mg by mouth daily. AC)     hydrOXYzine  (ATARAX ) 25 MG tablet Take one to two tablets at bedtime as needed for sleep. 60 tablet 2   ibuprofen (ADVIL,MOTRIN) 200 MG tablet Take 400-600 mg by mouth daily as needed for headache or moderate pain.     levocetirizine (XYZAL)  5 MG tablet Take 5 mg by mouth daily as needed for allergies.     levothyroxine (SYNTHROID) 112 MCG tablet Take 112 mcg by mouth every morning.     lisdexamfetamine  (VYVANSE ) 60 MG capsule Take 1 capsule (60 mg total) by mouth every morning. 30 capsule 0   LORazepam  (ATIVAN ) 0.5 MG tablet Take 1 tablet (0.5 mg total) by mouth 2 (two) times daily as needed for anxiety. 60 tablet 2   NURTEC 75 MG  TBDP 75 mg.     ondansetron  (ZOFRAN -ODT) 8 MG disintegrating tablet TAKE 1 TABLET (8 MG TOTAL) BY MOUTH EVERY 8 (EIGHT) HOURS AS NEEDED FOR NAUSEA OR VOMITING. 18 tablet 3   vortioxetine  HBr (TRINTELLIX ) 20 MG TABS tablet Take 1 tablet (20 mg total) by mouth daily. 30 tablet 2   zoledronic acid (RECLAST) 5 MG/100ML SOLN injection Infuse 100 mLs (5 mg total) into the vein once. Every other year for preventative     No current facility-administered medications for this visit.    Medication Side Effects: None  Allergies: Allergies[1]  Past Medical History:  Diagnosis Date   Anxiety    Cancer (HCC)    breast   Depression    Family history of breast cancer    Family history of heart disease    Family history of prostate cancer    History of radiation therapy 10/04/17-11/22/17   left breast 50.4 Gy in 28 fractions, left breast boost 10 Gy in 5 fractions, right breast 50.4 Gy in 28 fractions, right breast boost 12 Gy in 6 fractions   Hypothyroidism    Idiopathic urticaria    Malignant neoplasm of upper-outer quadrant of left female breast (HCC)    Migraine    Overweight    Thyroid disease     Family History  Problem Relation Age of Onset   Cancer Mother        SKIN AND BREAST   Heart disease Mother    Heart disease Father    Hyperlipidemia Father    Depression Father    Cancer Maternal Grandmother        BREAST   Melanoma Maternal Grandmother        toe   Cancer Maternal Grandfather        PROSTATE   Irritable bowel syndrome Paternal Grandmother    Heart disease Paternal Grandfather    Breast cancer Maternal Aunt 72   Breast cancer Maternal Aunt        dx in her 8s    Social History   Socioeconomic History   Marital status: Married    Spouse name: Not on file   Number of children: 2   Years of education: Not on file   Highest education level: Not on file  Occupational History   Not on file  Tobacco Use   Smoking status: Never   Smokeless tobacco: Never   Vaping Use   Vaping status: Never Used  Substance and Sexual Activity   Alcohol use: No   Drug use: No   Sexual activity: Yes  Other Topics Concern   Not on file  Social History Narrative   Not on file   Social Drivers of Health   Tobacco Use: Low Risk (10/10/2024)   Patient History    Smoking Tobacco Use: Never    Smokeless Tobacco Use: Never    Passive Exposure: Not on file  Financial Resource Strain: Not on file  Food Insecurity: No Food Insecurity (09/28/2022)   Received from [facility]   Epic    Within the past 12 months, you worried that your food would run out before you got the money to buy more.: Never true    Within the past 12 months, the food you bought just didn't last and you didn't have money to get more.: Never true  Transportation Needs: Not on file  Physical Activity: Not on file  Stress: Not on file  Social Connections: Not on file  Intimate Partner Violence: Not on file  Depression (EYV7-0): Not on file  Alcohol Screen: Not on file  Housing: Not on file  Utilities: Not on file  Health Literacy: Not on file    Past Medical History, Surgical history, Social history, and Family history were reviewed and updated as appropriate.   Please see review of systems for further details on the patient's review from today.   Objective:   Physical Exam:  LMP 04/08/2018   Physical Exam Constitutional:      General: She is not in acute distress. Musculoskeletal:        General: No deformity.  Neurological:     Mental Status: She is alert and oriented to person, place, and time.     Coordination: Coordination normal.  Psychiatric:        Attention and Perception: Attention and perception normal. She does not perceive auditory or visual hallucinations.        Mood and Affect: Mood normal. Mood is not anxious or depressed. Affect is not labile, blunt, angry or inappropriate.        Speech: Speech normal.        Behavior: Behavior normal.        Thought  Content: Thought content normal. Thought content is not paranoid or delusional. Thought content does not include homicidal or suicidal ideation. Thought content does not include homicidal or suicidal plan.        Cognition and Memory: Cognition and memory normal.        Judgment: Judgment normal.     Comments: Insight intact     Lab Review:     Component Value Date/Time   NA 138 07/18/2024 1127   K 4.8 07/18/2024 1127   CL 105 07/18/2024 1127   CO2 29 07/18/2024 1127   GLUCOSE 96 07/18/2024 1127   BUN 15 07/18/2024 1127   CREATININE 0.72 07/18/2024 1127   CALCIUM 10.2 07/18/2024 1127   PROT 7.4 07/18/2024 1127   ALBUMIN 4.5 07/18/2024 1127   AST 16 07/18/2024 1127   ALT 11 07/18/2024 1127   ALKPHOS 75 07/18/2024 1127   BILITOT 0.5 07/18/2024 1127   GFRNONAA >60 07/18/2024 1127   GFRAA >60 06/08/2020 1607   GFRAA >60 11/13/2017 1512       Component Value Date/Time   WBC 4.3 07/18/2024 1127   WBC 4.6 05/19/2024 0630   RBC 4.95 07/18/2024 1127   HGB 14.0 07/18/2024 1127   HCT 41.4 07/18/2024 1127   PLT 342 07/18/2024 1127   MCV 83.6 07/18/2024 1127   MCH 28.3 07/18/2024 1127   MCHC 33.8 07/18/2024 1127   RDW 12.5 07/18/2024 1127   LYMPHSABS 1.1 07/18/2024 1127   MONOABS 0.5 07/18/2024 1127   EOSABS 0.5 07/18/2024 1127   BASOSABS 0.1 07/18/2024 1127    No results found for: POCLITH, LITHIUM   No results found for: PHENYTOIN, PHENOBARB, VALPROATE, CBMZ   .res Assessment: Plan:    Plan:  PDMP reviewed  Hydroxyzine  25mg  - 1 to 2 at bedtime as needed  for sleep Lorazepam  0.5mg  twice daily as needed  Trintellix  20mg  daily  Vyvanse  60mg  daily  RTC 4 weeks  Patient advised to contact office with any questions, adverse effects, or acute worsening in signs and symptoms.   25 minutes spent dedicated to the care of this patient on the date of this encounter to include pre-visit review of records, ordering of medication, post visit documentation, and  face-to-face time with the patient discussing depression and anxiety. Discussed continuing current medication regimen.  Discussed potential benefits, risk, and side effects of benzodiazepines to include potential risk of tolerance and dependence, as well as possible drowsiness. Advised patient not to drive if experiencing drowsiness and to take lowest possible effective dose to minimize risk of dependence and tolerance.   Discussed potential benefits, risks, and side effects of stimulants with patient to include increased heart rate, palpitations, insomnia, increased anxiety, increased irritability, or decreased appetite.  Instructed patient to contact office if experiencing any significant tolerability issues.  Diagnoses and all orders for this visit:  Major depressive disorder, recurrent episode, moderate (HCC)  Attention deficit hyperactivity disorder (ADHD), combined type -     lisdexamfetamine  (VYVANSE ) 60 MG capsule; Take 1 capsule (60 mg total) by mouth every morning.  Generalized anxiety disorder  Insomnia, unspecified type     Please see After Visit Summary for patient specific instructions.  Future Appointments  Date Time Provider Department Center  10/21/2024  3:00 PM Sherlynn Sober, LCSW [facility] None  11/06/2024  3:00 PM Sherlynn Sober, LCSW [facility] None  11/18/2024  3:00 PM Sherlynn Sober, LCSW [facility] None  12/02/2024  4:00 PM Sherlynn Sober, LCSW [facility] None  07/21/2025 11:45 AM Loretha Ash, MD [facility] None    No orders of the defined types were placed in this encounter.     -------------------------------     [1]  Allergies Allergen Reactions   Latex Itching, Rash and Other (See Comments)    Pt states rubber bands from her braces causes ulcers in her mouth  mouth ulcers   Oxycodone  Rash and Other (See Comments)    Pt states could not sleep at all, was up 24 hrs     Celecoxib  Other (See Comments)    GI Upset (intolerance)

## 2024-10-21 ENCOUNTER — Ambulatory Visit: Admitting: Psychiatry

## 2024-10-21 DIAGNOSIS — F331 Major depressive disorder, recurrent, moderate: Secondary | ICD-10-CM

## 2024-10-21 NOTE — Progress Notes (Signed)
 "       [facility] Counselor/Therapist Progress Note  Patient ID: Marie Castro, MRN: [id number],    Date: 10/21/2024  Time Spent: 55 minutes   Treatment Type: Individual Therapy  Reported Symptoms:  Emotionally drained, Anxiety, depression, working on strengthening her marital relationship, loss of husband's job has been very helpful for patient, life changes ahead, trying to improve relationship with sister for entheses 2 years older than patient)    Mental Status Exam:  Appearance:   Casual and Neat     Behavior:  Appropriate, Sharing, and Motivated  Motor:  Normal  Speech/Language:   Clear and Coherent  Affect:  Depressed and anxious  Mood:  anxious and depressed  Thought process:  goal directed  Thought content:    Rumination  Sensory/Perceptual disturbances:    WNL  Orientation:  oriented to person, place, time/date, situation, day of week, month of year, year, and stated date of Jan. 13, 2026  Attention:  Good  Concentration:  Good  Memory:  WNL  Fund of knowledge:   Good  Insight:    Good  Judgment:   Good  Impulse Control:  Good   Risk Assessment: Danger to Self:  None Self-injurious Behavior: No Danger to Others: No Duty to Warn:no Physical Aggression / Violence:No  Access to Firearms a concern: No  Gang Involvement:No   Subjective:  Patient working today in session on her depression, marital stress, anxiety, and other family stressors that impact her. Dealing with multiple stressors including some financial stressors, stress regarding daughter and decision making for college, husband going for job interview soon in [place], little time for herself at times but has started some weight-training at the gym. Has 5 dachshunds and they bring a lot of joy to patient and other family members. Patient trying have some quality time for herself. No issues with sister more recently. Had a good Christmas even had some laughter which was good. Very tired today and plans to  get to bed earlier tonight.  Through more of her family issues in addition to how some of the changes and stressors are impacting her.  Hopeful that husband will get a job soon and does have a significant interview coming up as a second interview with same company.  Really needing some good support and acknowledgment today of all the stress that she is under and how she is trying to make good decisions, be very supportive of others in the family, and also taking care of herself in the midst of a lot of stress and changes and rearranges.  Continues to practice more give-and-take within the family in order to keep moving in a forward direction.  Continues to give college-age son and daughter more space at home and also shared that she and her sister seems to be getting along better more recently with less hurt.  Overall communication improved within the family.  Patient staying motivated in order to work further on her treatment goals, better managing stress that she experiences personally and within the family.    Interventions: Cognitive Behavioral Therapy, Solution-Oriented/Positive Psychology, and Ego-Supportive 1. Identify life conflicts and/or situations including from the past and the present, that support current symptomology of anxiety/depression. 2. Develop behavioral and cognitive strategies to reduce or eliminate excessive anxiety, depression symptoms.  Identify, challenge, and replace negative self-talk with more positive, realistic, and empowering self-talk. 3.  Patient will work on developing reality based, positive cognitive messages that can help her improve her mood and outlook  while also working on environmental consultant.   Diagnosis:   ICD-10-CM   1. Major depressive disorder, recurrent episode, moderate (HCC)  F33.1      Plan:   Patient today showing really good effort in session today working further on her anxiety, some depression, marital relationship stressors, and family  stressors.  She has been more diligent and focusing on her communication within the family as well as trying to keep good relationship with each family member.  Feels that she is better expressing herself with her words and communicating more directly, which is a positive learning point for her as she really wants better communication especially with husband and with her 2 kids 1 of which is graduating from high school and the other in college.  Shared a little more today about her continued grieving the loss of her parents and she is intentionally working on this more and does feel that she is beginning to step forward more in a better direction but has to be patient with her self.  Shares more today about her faith being so important to her both within her family and just herself personally and feels that she relies on her faith a lot which she recognizes is a positive.  Goal review and progress/challenges noted with patient.  Next appointment within 2 to 3 weeks.   Barnie Bunde, LCSW                   "

## 2024-10-29 ENCOUNTER — Encounter: Payer: Self-pay | Admitting: *Deleted

## 2024-10-30 ENCOUNTER — Encounter: Payer: Self-pay | Admitting: Hematology and Oncology

## 2024-11-06 ENCOUNTER — Telehealth: Admitting: Adult Health

## 2024-11-06 ENCOUNTER — Encounter: Payer: Self-pay | Admitting: Adult Health

## 2024-11-06 ENCOUNTER — Ambulatory Visit: Admitting: Psychiatry

## 2024-11-06 DIAGNOSIS — F411 Generalized anxiety disorder: Secondary | ICD-10-CM

## 2024-11-06 DIAGNOSIS — F902 Attention-deficit hyperactivity disorder, combined type: Secondary | ICD-10-CM

## 2024-11-06 DIAGNOSIS — F331 Major depressive disorder, recurrent, moderate: Secondary | ICD-10-CM

## 2024-11-06 DIAGNOSIS — G4700 Insomnia, unspecified: Secondary | ICD-10-CM

## 2024-11-06 NOTE — Progress Notes (Signed)
 Marie Castro [id number] [birth year] 54 y.o.  Virtual Visit via Video Note  I connected with pt @ on 11/06/24 at  2:00 PM EST by a video enabled telemedicine application and verified that I am speaking with the correct person using two identifiers.   I discussed the limitations of evaluation and management by telemedicine and the availability of in person appointments. The patient expressed understanding and agreed to proceed.  I discussed the assessment and treatment plan with the patient. The patient was provided an opportunity to ask questions and all were answered. The patient agreed with the plan and demonstrated an understanding of the instructions.   The patient was advised to call back or seek an in-person evaluation if the symptoms worsen or if the condition fails to improve as anticipated.  I provided 25 minutes of non-face-to-face time during this encounter.  The patient was located at home.  The provider was located at [facility].   Angeline Marie Sayers, NP   Subjective:   Patient ID:  Marie Castro is a 54 y.o. (DOB 08-20-1971) female.  Chief Complaint: No chief complaint on file.   HPI Marie Castro presents for follow-up of MDD, GAD insomnia and ADD.  Describes mood today as ok. Pleasant. Reports decreased tearfulness. Mood symptoms - reports some depression, anxiety and irritability - more situational.  Reports improved interest and motivation. Denies panic attacks. Denies worry, rumination and over thinking. Denies obsessive thoughts or acts. Reports mood as variable - a little up and down. Stating I feel like everything is still up and down, but better. Feels like current medication regimen is helpful. Taking medications as prescribed.  Energy levels improved. Active, has a regular exercise routine with current physical disabilities. Enjoys some usual interests and activities. Married x 22 years. Has 2 children. Family local. Spending time with  family. Appetite improved. Reports weight stable - 156 pounds. Reports sleep has improved. Averages 6 hours.  Reports focus and concentration stable. Completing tasks. Managing aspects of household. Working full time [employer]. Denies SI or HI.  Denies AH or VH. Denies self harm. Denies substance use.  Previous medication trials: Effexor , Trazadone  Review of Systems:  Review of Systems  Musculoskeletal:  Negative for gait problem.  Neurological:  Negative for tremors.  Psychiatric/Behavioral:         Please refer to HPI    Medications: I have reviewed the patient's current medications.  Current Outpatient Medications  Medication Sig Dispense Refill   [employer] D3 25 MCG (1000 UT) capsule TAKE 1 CAPSULE BY MOUTH EVERY DAY 120 capsule 4   diphenhydrAMINE  (BENADRYL ) 25 mg capsule Take one capsule (25 mg dose) by mouth every 6 (six) hours as needed for Itching.     docusate sodium (COLACE) 100 MG capsule Take 100 mg by mouth daily.     eletriptan (RELPAX) 40 MG tablet Take 1 tablet (40 mg total) by mouth as needed for migraine or headache. May repeat in 2 hours if headache persists or recurs.     famotidine (PEPCID) 20 MG tablet Take 20 mg by mouth daily. AC (Patient taking differently: Take 40 mg by mouth daily. AC)     hydrOXYzine  (ATARAX ) 25 MG tablet Take one to two tablets at bedtime as needed for sleep. 60 tablet 2   ibuprofen (ADVIL,MOTRIN) 200 MG tablet Take 400-600 mg by mouth daily as needed for headache or moderate pain.     levocetirizine (XYZAL) 5 MG tablet Take 5 mg by  mouth daily as needed for allergies.     levothyroxine (SYNTHROID) 112 MCG tablet Take 112 mcg by mouth every morning.     lisdexamfetamine  (VYVANSE ) 60 MG capsule Take 1 capsule (60 mg total) by mouth every morning. 30 capsule 0   LORazepam  (ATIVAN ) 0.5 MG tablet Take 1 tablet (0.5 mg total) by mouth 2 (two) times daily as needed for anxiety. 60 tablet 2   NURTEC 75 MG TBDP 75 mg.     ondansetron   (ZOFRAN -ODT) 8 MG disintegrating tablet TAKE 1 TABLET (8 MG TOTAL) BY MOUTH EVERY 8 (EIGHT) HOURS AS NEEDED FOR NAUSEA OR VOMITING. 18 tablet 3   vortioxetine  HBr (TRINTELLIX ) 20 MG TABS tablet Take 1 tablet (20 mg total) by mouth daily. 30 tablet 2   zoledronic acid (RECLAST) 5 MG/100ML SOLN injection Infuse 100 mLs (5 mg total) into the vein once. Every other year for preventative     No current facility-administered medications for this visit.    Medication Side Effects: None  Allergies: Allergies[1]  Past Medical History:  Diagnosis Date   Anxiety    Cancer (HCC)    breast   Depression    Family history of breast cancer    Family history of heart disease    Family history of prostate cancer    History of radiation therapy 10/04/17-11/22/17   left breast 50.4 Gy in 28 fractions, left breast boost 10 Gy in 5 fractions, right breast 50.4 Gy in 28 fractions, right breast boost 12 Gy in 6 fractions   Hypothyroidism    Idiopathic urticaria    Malignant neoplasm of upper-outer quadrant of left female breast (HCC)    Migraine    Overweight    Thyroid disease     Family History  Problem Relation Age of Onset   Cancer Mother        SKIN AND BREAST   Heart disease Mother    Heart disease Father    Hyperlipidemia Father    Depression Father    Cancer Maternal Grandmother        BREAST   Melanoma Maternal Grandmother        toe   Cancer Maternal Grandfather        PROSTATE   Irritable bowel syndrome Paternal Grandmother    Heart disease Paternal Grandfather    Breast cancer Maternal Aunt 72   Breast cancer Maternal Aunt        dx in her 3s    Social History   Socioeconomic History   Marital status: Married    Spouse name: Not on file   Number of children: 2   Years of education: Not on file   Highest education level: Not on file  Occupational History   Not on file  Tobacco Use   Smoking status: Never   Smokeless tobacco: Never  Vaping Use   Vaping status: Never  Used  Substance and Sexual Activity   Alcohol use: No   Drug use: No   Sexual activity: Yes  Other Topics Concern   Not on file  Social History Narrative   Not on file   Social Drivers of Health   Tobacco Use: Low Risk (11/06/2024)   Patient History    Smoking Tobacco Use: Never    Smokeless Tobacco Use: Never    Passive Exposure: Not on file  Financial Resource Strain: Not on file  Food Insecurity: No Food Insecurity (09/28/2022)   Received from [facility]  Within the past 12 months, you worried that your food would run out before you got the money to buy more.: Never true    Within the past 12 months, the food you bought just didn't last and you didn't have money to get more.: Never true  Transportation Needs: Not on file  Physical Activity: Not on file  Stress: Not on file  Social Connections: Not on file  Intimate Partner Violence: Not on file  Depression (EYV7-0): Not on file  Alcohol Screen: Not on file  Housing: Not on file  Utilities: Not on file  Health Literacy: Not on file    Past Medical History, Surgical history, Social history, and Family history were reviewed and updated as appropriate.   Please see review of systems for further details on the patient's review from today.   Objective:   Physical Exam:  LMP 04/08/2018   Physical Exam Constitutional:      General: She is not in acute distress. Musculoskeletal:        General: No deformity.  Neurological:     Mental Status: She is alert and oriented to person, place, and time.     Coordination: Coordination normal.  Psychiatric:        Attention and Perception: Attention and perception normal. She does not perceive auditory or visual hallucinations.        Mood and Affect: Mood normal. Mood is not anxious or depressed. Affect is not labile, blunt, angry or inappropriate.        Speech: Speech normal.        Behavior: Behavior normal.        Thought Content: Thought content normal.  Thought content is not paranoid or delusional. Thought content does not include homicidal or suicidal ideation. Thought content does not include homicidal or suicidal plan.        Cognition and Memory: Cognition and memory normal.        Judgment: Judgment normal.     Comments: Insight intact     Lab Review:     Component Value Date/Time   NA 138 07/18/2024 1127   K 4.8 07/18/2024 1127   CL 105 07/18/2024 1127   CO2 29 07/18/2024 1127   GLUCOSE 96 07/18/2024 1127   BUN 15 07/18/2024 1127   CREATININE 0.72 07/18/2024 1127   CALCIUM 10.2 07/18/2024 1127   PROT 7.4 07/18/2024 1127   ALBUMIN 4.5 07/18/2024 1127   AST 16 07/18/2024 1127   ALT 11 07/18/2024 1127   ALKPHOS 75 07/18/2024 1127   BILITOT 0.5 07/18/2024 1127   GFRNONAA >60 07/18/2024 1127   GFRAA >60 06/08/2020 1607   GFRAA >60 11/13/2017 1512       Component Value Date/Time   WBC 4.3 07/18/2024 1127   WBC 4.6 05/19/2024 0630   RBC 4.95 07/18/2024 1127   HGB 14.0 07/18/2024 1127   HCT 41.4 07/18/2024 1127   PLT 342 07/18/2024 1127   MCV 83.6 07/18/2024 1127   MCH 28.3 07/18/2024 1127   MCHC 33.8 07/18/2024 1127   RDW 12.5 07/18/2024 1127   LYMPHSABS 1.1 07/18/2024 1127   MONOABS 0.5 07/18/2024 1127   EOSABS 0.5 07/18/2024 1127   BASOSABS 0.1 07/18/2024 1127    No results found for: POCLITH, LITHIUM   No results found for: PHENYTOIN, PHENOBARB, VALPROATE, CBMZ   .res Assessment: Plan:    Plan:  PDMP reviewed  Continue: Hydroxyzine  25mg  - 1 to 2 at bedtime as needed for sleep Lorazepam  0.5mg  twice daily  as needed Trintellix  20mg  daily Vyvanse  60mg  daily  RTC 4 weeks  Patient advised to contact office with any questions, adverse effects, or acute worsening in signs and symptoms.   25 minutes spent dedicated to the care of this patient on the date of this encounter to include pre-visit review of records, ordering of medication, post visit documentation, and face-to-face time with the  patient discussing depression and anxiety. Discussed continuing current medication regimen.  Discussed potential benefits, risk, and side effects of benzodiazepines to include potential risk of tolerance and dependence, as well as possible drowsiness. Advised patient not to drive if experiencing drowsiness and to take lowest possible effective dose to minimize risk of dependence and tolerance.   Discussed potential benefits, risks, and side effects of stimulants with patient to include increased heart rate, palpitations, insomnia, increased anxiety, increased irritability, or decreased appetite.  Instructed patient to contact office if experiencing any significant tolerability issues.  Diagnoses and all orders for this visit:  Major depressive disorder, recurrent episode, moderate (HCC)  Attention deficit hyperactivity disorder (ADHD), combined type  Generalized anxiety disorder  Insomnia, unspecified type     Please see After Visit Summary for patient specific instructions.  Future Appointments  Date Time Provider Department Center  11/06/2024  3:00 PM Sherlynn Sober, LCSW [facility] None  11/18/2024  3:00 PM Sherlynn Sober, LCSW [facility] None  12/02/2024  4:00 PM Sherlynn Sober, LCSW [facility] None  07/21/2025 11:45 AM Loretha Ash, MD [facility] None    No orders of the defined types were placed in this encounter.     -------------------------------      [1]  Allergies Allergen Reactions   Latex Itching, Rash and Other (See Comments)    Pt states rubber bands from her braces causes ulcers in her mouth  mouth ulcers   Oxycodone  Rash and Other (See Comments)    Pt states could not sleep at all, was up 24 hrs     Celecoxib  Other (See Comments)    GI Upset (intolerance)

## 2024-11-06 NOTE — Progress Notes (Signed)
 "       [facility] Counselor/Therapist Progress Note  Patient ID: Marie Castro, MRN: [id number],    Date: 11/06/2024  Time Spent: 55 minutes   Treatment Type: Individual Therapy  Reported Symptoms: depression, anxiety, emotionally drained, life changes ahead, trying to improve relationship with sister  Mental Status Exam:  Appearance:   Neat and Well Groomed     Behavior:  Appropriate, Sharing, and Motivated  Motor:  Normal  Speech/Language:   Clear and Coherent and Normal Rate  Affect:  Appropriate  Mood:  anxious and depressed  Thought process:  goal directed  Thought content:    WNL  Sensory/Perceptual disturbances:    WNL  Orientation:  oriented to person, place, time/date, situation, day of week, month of year, year, and stated date of Jan. 29, 2026  Attention:  Good  Concentration:  Good  Memory:  WNL  Fund of knowledge:   Good  Insight:    Good  Judgment:   Good  Impulse Control:  Good   Risk Assessment: Danger to Self:  No Self-injurious Behavior: No Danger to Others: No Duty to Warn:no Physical Aggression / Violence:No  Access to Firearms a concern: No  Gang Involvement:No   Subjective: Patient today very frustrated, hurt, angry due to being very misled by one of their young adult children regarding deceptive behavior on the part of an adult daughter which caught parents off-guard. Not all details shared in this note, due to patient privacy needs. I cannot hide my emotions and right now I'm needing to process like this, before I can move forward. Focusing on healthy ways of further communication with daughter and with sone. Tearfulness but also grateful daughter is ok.  Talked through plan for interacting with daughter and holding her responsible for her actions but also with her knowledge that they still love and care about her and her safety.  Very sensitive situation within the family but thankfully they intercepted some of what had been planned by daughter and are  now feeling grateful that original plans were not able to move forward, and that their daughter is safe at home as they try to work together in ways that are productive and that daughter realizes she is still loved but that her behavior and planning most recently are not acceptable nor safe for her.  (Not all info included in this note due to patient privacy needs).  Parent to return for next appointment between 1 to 2 weeks.   Interventions: Cognitive Behavioral Therapy, Solution-Oriented/Positive Psychology, and Ego-Supportive Identify life conflicts and/or situations including from the past and the present, that support current symptomology of anxiety/depression.  2.   Develop behavioral and cognitive strategies to reduce or eliminate excessive anxiety, depression symptoms.  Identify, challenge, and replace negative self-talk with more positive, realistic, and empowering self-talk.  3.   Patient will work on developing reality based, positive cognitive messages that can help her improve her mood and outlook while also working on environmental consultant.   Diagnosis:   ICD-10-CM   1. Major depressive disorder, recurrent episode, moderate (HCC)  F33.1       Plan:  Patient today really needing this session due to issues that have occurred at home between her and adult daughter and her husband, which eventually included the whole family.  Not all details included in this note due to patient privacy needs.  As noted above mother was very open today in session and processing a lot of of doubt, hurt, anger,  but also glad that her daughter is safe.  Worked well on these issues in session today and seem to feel some relief.  Will return within 2 weeks.  Goal review and progress/challenges noted with patient.  Next appointment within 2 weeks.   Barnie Bunde, LCSW                   "

## 2024-11-11 ENCOUNTER — Other Ambulatory Visit: Payer: Self-pay

## 2024-11-11 ENCOUNTER — Telehealth: Payer: Self-pay | Admitting: Adult Health

## 2024-11-11 DIAGNOSIS — F902 Attention-deficit hyperactivity disorder, combined type: Secondary | ICD-10-CM

## 2024-11-11 MED ORDER — LISDEXAMFETAMINE DIMESYLATE 60 MG PO CAPS: 60.0000 mg | ORAL_CAPSULE | ORAL | 0 refills | Status: AC

## 2024-11-11 NOTE — Telephone Encounter (Signed)
 Pended

## 2024-11-13 ENCOUNTER — Ambulatory Visit: Admitting: Psychiatry

## 2024-11-13 ENCOUNTER — Encounter: Payer: Self-pay | Admitting: Hematology and Oncology

## 2024-11-13 DIAGNOSIS — F411 Generalized anxiety disorder: Secondary | ICD-10-CM

## 2024-11-13 NOTE — Progress Notes (Signed)
 "       [facility] Counselor/Therapist Progress Note  Patient ID: Marie Castro, MRN: [id number],    Date: 11/13/2024  Time Spent: 45 minutes   Treatment Type: Individual Therapy  Reported Symptoms:  anxiety, decreased depression and sadness  Mental Status Exam:  Appearance:   Casual and Neat     Behavior:  Appropriate, Sharing, and Motivated  Motor:  Normal  Speech/Language:   Clear and Coherent  Affect:  anxious  Mood:  anxious  Thought process:  goal directed  Thought content:    WNL  Sensory/Perceptual disturbances:    WNL  Orientation:  oriented to person, place, time/date, situation, day of week, month of year, year, and stated date of Feb. 5, 2026  Attention:  Good  Concentration:  Good  Memory:  WNL  Fund of knowledge:   Good  Insight:    Good  Judgment:   Good  Impulse Control:  Good   Risk Assessment: Danger to Self:  No Self-injurious Behavior: No Danger to Others: No Duty to Warn:no Physical Aggression / Violence:No  Access to Firearms a concern: No  Gang Involvement:No   Subjective: Patient today working further on her anxiety and some depression and does feel she is making progress. Biggest internal stress is managing things with her 2 kids (1 at home and the other in college), and trying to support husband in job search.  Talked through some of her recent family experiences and also the challenges of trying to support her 2 children and also husband who is not currently employed but is job psychologist, educational and interviewing.  Husband and wife do seem to work well together within the home and helping manage the needs of their children.  Patient thankful that she has a current job right now and is anxious for her husband to get a job.  Very supportive of husband and feels that he is doing everything he knows to do to try to get interviews that could lead to a job.  Talked more about some of the hurt that she shared in the last session and processed this more with some  updates today.  Is better able to talk with her daughter about this at this point and no longer in the avoidance pattern and feeling that she cannot discuss it with daughter.  Very sensitive situation and parents are working together to try and make good decisions and be healthy/supportive parents.  (Not all details included in this note due to patient privacy needs).  Continues to work on her goals as noted below and is showing progress.   Interventions: Cognitive Behavioral Therapy, Solution-Oriented/Positive Psychology, and Insight-Oriented Identify life conflicts from the past and present that support current symptomology of depression and anxiety. 2.  Develop behavioral and cognitive strategies to reduce or eliminate excessive anxiety, depression symptoms. Identify, challenge and replace negative self-talk with more positive, realistic, and empowering self-talk. 3.  Patient will work on it sales professional, positive cognitive messages that can help her improve her mood and outlook while also working on environmental consultant.  Diagnosis:   ICD-10-CM   1. Generalized anxiety disorder  F41.1      Plan:   Patient today actively participating in session as she worked further on her anxiety, and a family situation involving deceptive behavior recently, and a break-up in another relationship. Processing her feelings related to some sadness and frustration in trying to help son.  As noted above, also trying to be supportive of her daughter and helpful  ways with situations in which she has been involved.  (Not all details included in this note due to patient privacy needs).  Less tearfulness today, continues to focus on healthy communication with daughter and son.  Some decreased hurt feelings and trying to see more positives into the future regarding several personal and family situations.  To return for next appointment within 2 weeks.  Goal review and progress/challenges noted with  patient.  Next appt within 2 weeks.   Barnie Bunde, LCSW                   "

## 2024-11-18 ENCOUNTER — Ambulatory Visit: Admitting: Psychiatry

## 2024-12-02 ENCOUNTER — Ambulatory Visit: Admitting: Psychiatry

## 2024-12-09 ENCOUNTER — Ambulatory Visit: Admitting: Adult Health

## 2024-12-16 ENCOUNTER — Ambulatory Visit: Admitting: Psychiatry

## 2025-01-07 ENCOUNTER — Ambulatory Visit (INDEPENDENT_AMBULATORY_CARE_PROVIDER_SITE_OTHER): Admitting: Psychiatry

## 2025-07-21 ENCOUNTER — Ambulatory Visit: Admitting: Hematology and Oncology
